# Patient Record
Sex: Male | Born: 1950 | Race: White | Hispanic: No | Marital: Married | Smoking: Former smoker
Health system: Southern US, Community
[De-identification: ages and names within clinical notes are randomized; demographics above are authoritative.]

## PROBLEM LIST (undated history)

## (undated) DIAGNOSIS — I499 Cardiac arrhythmia, unspecified: Secondary | ICD-10-CM

## (undated) DIAGNOSIS — M1990 Unspecified osteoarthritis, unspecified site: Secondary | ICD-10-CM

## (undated) DIAGNOSIS — K219 Gastro-esophageal reflux disease without esophagitis: Secondary | ICD-10-CM

## (undated) DIAGNOSIS — Z8719 Personal history of other diseases of the digestive system: Secondary | ICD-10-CM

## (undated) DIAGNOSIS — T7840XA Allergy, unspecified, initial encounter: Secondary | ICD-10-CM

## (undated) DIAGNOSIS — H269 Unspecified cataract: Secondary | ICD-10-CM

## (undated) DIAGNOSIS — K429 Umbilical hernia without obstruction or gangrene: Secondary | ICD-10-CM

## (undated) DIAGNOSIS — Z87442 Personal history of urinary calculi: Secondary | ICD-10-CM

## (undated) DIAGNOSIS — I639 Cerebral infarction, unspecified: Secondary | ICD-10-CM

## (undated) HISTORY — PX: HERNIA REPAIR: SHX51

## (undated) HISTORY — PX: EYE SURGERY: SHX253

## (undated) HISTORY — PX: EXCISIONAL HEMORRHOIDECTOMY: SHX1541

## (undated) HISTORY — DX: Gastro-esophageal reflux disease without esophagitis: K21.9

## (undated) HISTORY — DX: Unspecified cataract: H26.9

## (undated) HISTORY — DX: Cerebral infarction, unspecified: I63.9

## (undated) HISTORY — DX: Allergy, unspecified, initial encounter: T78.40XA

## (undated) HISTORY — PX: ABDOMINAL HERNIA REPAIR: SHX539

## (undated) HISTORY — PX: SPINE SURGERY: SHX786

## (undated) HISTORY — PX: BACK SURGERY: SHX140

## (undated) HISTORY — PX: CATARACT EXTRACTION W/ INTRAOCULAR LENS  IMPLANT, BILATERAL: SHX1307

---

## 2001-02-26 ENCOUNTER — Encounter: Payer: Self-pay | Admitting: Emergency Medicine

## 2001-02-26 ENCOUNTER — Inpatient Hospital Stay (HOSPITAL_COMMUNITY): Admission: EM | Admit: 2001-02-26 | Discharge: 2001-02-28 | Payer: Self-pay | Admitting: Emergency Medicine

## 2001-03-24 HISTORY — PX: CARDIAC CATHETERIZATION: SHX172

## 2001-03-31 ENCOUNTER — Ambulatory Visit (HOSPITAL_COMMUNITY): Admission: RE | Admit: 2001-03-31 | Discharge: 2001-03-31 | Payer: Self-pay | Admitting: Cardiovascular Disease

## 2001-04-15 ENCOUNTER — Ambulatory Visit (HOSPITAL_COMMUNITY): Admission: RE | Admit: 2001-04-15 | Discharge: 2001-04-15 | Payer: Self-pay | Admitting: Family Medicine

## 2001-04-21 ENCOUNTER — Encounter: Payer: Self-pay | Admitting: Thoracic Surgery

## 2001-04-21 ENCOUNTER — Encounter: Admission: RE | Admit: 2001-04-21 | Discharge: 2001-04-21 | Payer: Self-pay | Admitting: Thoracic Surgery

## 2003-03-22 ENCOUNTER — Encounter: Admission: RE | Admit: 2003-03-22 | Discharge: 2003-03-22 | Payer: Self-pay | Admitting: Family Medicine

## 2005-07-22 ENCOUNTER — Ambulatory Visit (HOSPITAL_COMMUNITY): Admission: RE | Admit: 2005-07-22 | Discharge: 2005-07-22 | Payer: Self-pay | Admitting: Specialist

## 2005-09-02 ENCOUNTER — Encounter: Admission: RE | Admit: 2005-09-02 | Discharge: 2005-09-02 | Payer: Self-pay | Admitting: Family Medicine

## 2005-09-25 ENCOUNTER — Encounter: Admission: RE | Admit: 2005-09-25 | Discharge: 2005-09-25 | Payer: Self-pay | Admitting: Family Medicine

## 2005-10-22 HISTORY — PX: ANTERIOR CERVICAL DECOMP/DISCECTOMY FUSION: SHX1161

## 2005-11-05 ENCOUNTER — Inpatient Hospital Stay (HOSPITAL_COMMUNITY): Admission: AD | Admit: 2005-11-05 | Discharge: 2005-11-07 | Payer: Self-pay | Admitting: Neurosurgery

## 2006-06-22 ENCOUNTER — Ambulatory Visit: Payer: Self-pay | Admitting: Gastroenterology

## 2006-07-06 ENCOUNTER — Encounter (INDEPENDENT_AMBULATORY_CARE_PROVIDER_SITE_OTHER): Payer: Self-pay | Admitting: Specialist

## 2006-07-06 ENCOUNTER — Ambulatory Visit: Payer: Self-pay | Admitting: Gastroenterology

## 2007-08-03 ENCOUNTER — Encounter: Admission: RE | Admit: 2007-08-03 | Discharge: 2007-08-03 | Payer: Self-pay | Admitting: General Surgery

## 2009-12-22 DIAGNOSIS — I639 Cerebral infarction, unspecified: Secondary | ICD-10-CM

## 2009-12-22 HISTORY — DX: Cerebral infarction, unspecified: I63.9

## 2009-12-25 ENCOUNTER — Ambulatory Visit: Payer: Self-pay | Admitting: Cardiovascular Disease

## 2009-12-25 ENCOUNTER — Inpatient Hospital Stay (HOSPITAL_COMMUNITY): Admission: EM | Admit: 2009-12-25 | Discharge: 2009-12-27 | Payer: Self-pay | Admitting: Emergency Medicine

## 2009-12-26 ENCOUNTER — Encounter (INDEPENDENT_AMBULATORY_CARE_PROVIDER_SITE_OTHER): Payer: Self-pay | Admitting: Neurology

## 2009-12-27 ENCOUNTER — Encounter: Payer: Self-pay | Admitting: Internal Medicine

## 2010-04-14 ENCOUNTER — Encounter: Payer: Self-pay | Admitting: Family Medicine

## 2010-06-05 LAB — HEMOGLOBIN A1C
Hgb A1c MFr Bld: 5.6 % (ref <5.7)
Mean Plasma Glucose: 114 mg/dL (ref <117)

## 2010-06-05 LAB — CK TOTAL AND CKMB (NOT AT ARMC): Relative Index: INVALID (ref 0.0–2.5)

## 2010-06-05 LAB — PROTEIN C ACTIVITY: Protein C Activity: 117 % (ref 75–133)

## 2010-06-05 LAB — ANTITHROMBIN III: AntiThromb III Func: 83 % (ref 76–126)

## 2010-06-05 LAB — COMPREHENSIVE METABOLIC PANEL
AST: 20 U/L (ref 0–37)
BUN: 9 mg/dL (ref 6–23)
CO2: 29 mEq/L (ref 19–32)
Calcium: 8.8 mg/dL (ref 8.4–10.5)
Creatinine, Ser: 0.97 mg/dL (ref 0.4–1.5)
GFR calc Af Amer: >60 mL/min (ref >60)
GFR calc non Af Amer: >60 mL/min (ref >60)
Glucose, Bld: 113 mg/dL — ABNORMAL HIGH (ref 70–99)

## 2010-06-05 LAB — BETA-2-GLYCOPROTEIN I ABS, IGG/M/A: Beta-2 Glyco I IgG: 0 G Units (ref <20)

## 2010-06-05 LAB — POCT I-STAT, CHEM 8
BUN: 10 mg/dL (ref 6–23)
Calcium, Ion: 1.15 mmol/L (ref 1.12–1.32)
Chloride: 102 mEq/L (ref 96–112)

## 2010-06-05 LAB — CBC
HCT: 42.1 % (ref 39.0–52.0)
Hemoglobin: 14.8 g/dL (ref 13.0–17.0)
MCH: 31.9 pg (ref 26.0–34.0)
MCHC: 35.2 g/dL (ref 30.0–36.0)

## 2010-06-05 LAB — TROPONIN I: Troponin I: 0.01 ng/mL (ref 0.00–0.06)

## 2010-06-05 LAB — LIPID PANEL: Cholesterol: 130 mg/dL (ref 0–200)

## 2010-06-05 LAB — POCT CARDIAC MARKERS
CKMB, poc: <1 ng/mL — ABNORMAL LOW (ref 1.0–8.0)
Troponin i, poc: <0.05 ng/mL (ref 0.00–0.09)

## 2010-06-05 LAB — CARDIOLIPIN ANTIBODIES, IGG, IGM, IGA
Anticardiolipin IgA: 5 APL U/mL — ABNORMAL LOW (ref <22)
Anticardiolipin IgG: 11 GPL U/mL — ABNORMAL LOW (ref <23)
Anticardiolipin IgM: 2 MPL U/mL — ABNORMAL LOW (ref <11)

## 2010-06-05 LAB — DIFFERENTIAL
Basophils Relative: 0 % (ref 0–1)
Eosinophils Absolute: 0 10*3/uL (ref 0.0–0.7)
Monocytes Absolute: 0.5 10*3/uL (ref 0.1–1.0)
Monocytes Relative: 6 % (ref 3–12)
Neutro Abs: 6.2 10*3/uL (ref 1.7–7.7)

## 2010-06-05 LAB — RPR: RPR Ser Ql: NONREACTIVE

## 2010-06-05 LAB — FACTOR 5 LEIDEN

## 2010-06-05 LAB — LUPUS ANTICOAGULANT PANEL

## 2010-06-05 LAB — PROTEIN S ACTIVITY: Protein S Activity: 93 % (ref 69–129)

## 2010-06-05 LAB — HOMOCYSTEINE: Homocysteine: 8.5 umol/L (ref 4.0–15.4)

## 2010-06-05 LAB — APTT: aPTT: 26 seconds (ref 24–37)

## 2010-08-09 NOTE — Discharge Summary (Signed)
[place]. [facility]  Patient:    Stephen King, Stephen King Visit Number: [id number] MRN: [id number]          Service Type: MED Location: [phone] Attending Physician:  Roque Lias Dictated by:   Reuben Likes, M.D. Admit Date:  02/26/2001 Discharge Date: 02/28/2001   CC:         Carolyne Fiscal, M.D.   Discharge Summary  SUMMARY OF HISTORY AND PHYSICAL:  Stephen King is a 60 year old male who has had a one week history of intermittent substernal chest pain without radiation, brought on sometimes by exertion such as stair climbing, but also would come on at rest.  The pain has been associated with shortness of breath and presyncope, but not nausea or diaphoresis.  Episodes would last from 1-5 minutes at a time.  He has no cardiac pain.  The pain was relieved by nitroglycerin in the emergency room.  PHYSICAL EXAMINATION:  VITAL SIGNS:  Physical examination reveals a blood pressure of 125/77, a pulse of 78 and regular, respirations are 16 and temperature of 100.3 degrees.  GENERAL APPEARANCE:  He appears alert and in no distress.  SKIN:  Warm and dry.  HEENT:  PERRL.  Fundi are benign.  ENT are unremarkable.  NECK:  There is no cervical adenopathy, JVD or bruits.  Thyroid is normal.  LUNGS:  Clear.  HEART:  His cardiac rhythm is regular without gallop, murmur or rub.  ABDOMEN:  Exam is unremarkable.  EXTREMITIES:  He has no pedal edema.  Pulses are full.  NEUROLOGIC:  Exam is within normal limits.  ADMITTING IMPRESSION:  Chest pain, rule out angina.  SUMMARY OF LABORATORIES:  His admitting EKG was normal and follow up tracing the next morning showed no change.  CT of the chest was negative for pulmonary embolus.  There were changes associated with COPD and he had an 8 x 10 mm right middle lobe nodule.  Hemoglobin was 15.4 and white count 7,800.  His C-MET was within normal limits.  CPK was 87 with an MB of 1.2 and troponin was less  than 0.01.  Follow up CPKs were also within the normal range.  MBs were negative times three and troponins were negative times three.  HOSPITAL COURSE:  The patient was hospitalized on a telemetry bed and seen by Dr. Charlton Haws.  Vital signs were obtained every four hours.  He was allowed to get up for bathroom privileges.  He was given a 2-gram, fat modified diet. D5 half normal saline was started at 50 cc/hour.  He was begun on heparin by pharmacy protocol, Lopressor 50 mg b.i.d., nitroglycerin drip was continued and he was given aspirin and Tylenol for pain.  By the following morning he still complained of an ache in the substernal area, which was mild and constant since admission.  His monitor showed normal sinus rhythm.  His heparin and nitroglycerin were discontinued.  He was allowed to be up ad lib.  The following morning he had no further chest pain, was ambulating in the hallway.  He had no shortness of breath.  He felt a little bit light-headed. His blood pressure was 100/70 and his pulse was 70.  His lungs were clear. His cardiac rhythm was regular without murmur or rub.  Monitor showed normal sinus rhythm.  It was felt he could be discharged on that date to follow up as an outpatient and a stress Cardiolite was to be done by Dr. Myrtis Ser.  The pulmonary nodule is also to be followed up as an outpatient and he will be referred to a chest surgeon for further evaluation and follow up of that.  DISCHARGE DIAGNOSES: 1. Chest pain of uncertain cause. 2. Lung nodule.  DISCHARGE MEDICATIONS:  Ecotrin 325 mg daily, metoprolol 50 mg b.i.d., nitroglycerin as needed for chest pain and Protonix 20 mg twice daily.  ACTIVITY:  No work for the next three to seven days.  DIET:  Low-fat, low-cholesterol.  FOLLOW-UP:  He is to see me the following Friday and also to see Dr. Myrtis Ser for the stress Cardiolite scan. Dictated by:   Reuben Likes, M.D. Attending Physician:  Roque Lias DD:  03/20/01 TD:  03/21/01 Job: [id number]

## 2010-08-09 NOTE — Cardiovascular Report (Signed)
. [facility]  Patient:    Stephen King, Stephen King Visit Number: [id number] MRN: [id number]          Service Type: CAT Location: [facility] [id number] Attending Physician:  Colon Branch Dictated by:   Arturo Morton Riley Kill, M.D. [employer] Proc. Date: 03/31/01 Admit Date:  03/31/2001   CC:         Reuben Likes, M.D.  Luis Abed, M.D. [facility]  Theron Arista C. Eden Emms, M.D. [facility]  CV Laboratory   Cardiac Catheterization  INDICATIONS:  The patient was recently admitted with some chest pain.  He subsequently had a CT scan which ruled out pulmonary emboli, but he had a pulmonary nodule, and he has been followed by Dr. Edwyna Shell.  He was also seen by Dr. Myrtis Ser, and he was noted to have a mildly abnormal Cardiolite.  He was subsequently referred for coronary arteriography.  The Cardiolite scan revealed no electrocardiographic changes with exercise and a very mild apical ischemia. This was a very small territory.  PROCEDURE: 1. Left heart catheterization. 2. Selective coronary arteriography. 3. Selective left ventriculography. 4. Proximal root aortography.  CARDIOLOGIST:  Arturo Morton. Riley Kill, M.D. [facility]  DESCRIPTION OF THE PROCEDURE:  The procedure was performed from the right femoral artery using 6-French catheter.  He tolerated the procedure well without complication.  He was taken to the holding area in satisfactory clinical condition.  HEMODYNAMIC DATA: 1. The central aortic pressure was 103/10. 2. LV pressure 105/70. 3. No gradient on pullback across the aortic valve.  ANGIOGRAPHIC DATA: 1. The left main coronary artery was extremely short.  It bifurcated into    an LAD and circumflex. 2. The left anterior descending artery coursed to the apex.  The LAD tapered    in the mid and distal portion and was a very small caliber vessel.  It did    wrap at the apex.  There was minor luminal irregularity but no high-grade    areas of disease. 3. The circumflex was a fairly  large vessel providing a modest size proximal    marginal branch, a very large second marginal branch, and three    posterolateral branches.  The circumflex had minor luminal irregularity but    no high-grade areas of disease. 4. The right coronary artery was a dominant vessel providing posterior    descending and posterolateral system.  It was free of significant disease. 5. The left ventricular systolic function was normal.  No segmental    abnormalities or contraction were identified. 6. The aortic root demonstrates no evidence of aortic regurgitation or    dissection.  CONCLUSIONS: 1. Normal left ventricular function. 2. No areas of high-grade coronary focal obstruction. 3. No evidence of aortic dissection.  DISPOSITION:  Follow up with Dr. Lorenz Coaster and Dr. Myrtis Ser. Dictated by:   Arturo Morton Riley Kill, M.D. [facility] Attending Physician:  Colon Branch DD:  03/31/01 TD:  03/31/01 Job: [id number]

## 2010-08-09 NOTE — Consult Note (Signed)
[place]. [facility]  Patient:    Stephen King, Stephen King Visit Number: [id number] MRN: [id number]          Service Type: MED Location: [phone] Attending Physician:  Roque Lias Dictated by:   Noralyn Pick Eden Emms, M.D., [facility], [facility] Proc. Date: 02/26/01 Admit Date:  02/26/2001   CC:         Dr. Lorenz Coaster   Consultation Report  REASON FOR CONSULTATION:  Chest pain.  HISTORY OF PRESENT ILLNESS:  Stephen King is a 60 year old patient of Dr. Lorenz Coaster.  He has intermittent substernal chest pain over the last week to 10 days.  The pain is somewhat constant.  It is not necessarily related to exertion.  There is no pleuritic component.  The patient has not had pain like this before.  He has had some heartburn in the past, but this is decidedly different.  The patient has occasional lightheadedness and dyspnea with this. There is no previous history of PE or lower extremity clot.  The patient has not had early relief of the pain in the ER with nitroglycerin.  PAST MEDICAL HISTORY: Remarkable for surgery in 1992 for a disk.  He has not had followup with Dr. Lorenz Coaster in a year or two.  ALLERGIES:  FELDENE.  MEDICATIONS:  The patient takes no medicines on a routine basis.  PHYSICAL EXAMINATION:  VITAL SIGNS:  Blood pressure 110/70 in each arm.  Heart rate was 70 and regular.  LUNGS:  Clear.  NECK:  Carotid were normal.  There were no supraclavicular bruits.  CARDIAC:  There was an S1, S2 without murmur, rub, gallop, or click.  ABDOMEN:  Benign.  There is no AAA.  EXTREMITIES:  Lower extremities have intact pulses with no edema.  LABORATORY DATA:  EKG shows some flat ST segments laterally but was read as normal by the computer.  Initial set of lab work was normal.  IMPRESSION:  The patients pain is somewhat atypical for angina.  He has had significant pain with a tearing quality which is nonexertional, and his chest x-ray shows minimal aortic root  dilatation, but I think it is reasonable to proceed with a CT scan to rule out pulmonary embolism or aortic dissection. If this is normal, I would admit him for 24-hour observation.  Repeat his enzymes and EKG, and if these are unremarkable and he has relief of his symptoms with nonsteroidal anti-inflammatories, then I think it is reasonable to do an outpatient Cardiolite study on the patient. Dictated by:   Noralyn Pick Eden Emms, M.D., [facility], [facility] Attending Physician:  Roque Lias DD:  02/26/01 TD:  02/27/01 Job: [id number]

## 2010-08-09 NOTE — Op Note (Signed)
NAME:  Stephen King, Stephen King NO.:  [id number]   MEDICAL RECORD NO.:  [id number]          PATIENT TYPE:  OIB   LOCATION:  3037                         FACILITY:  [facility]   PHYSICIAN:  Hilda Lias, M.D.   DATE OF BIRTH:  Nov 26, 1950   DATE OF PROCEDURE:  11/04/2005  DATE OF DISCHARGE:                                 OPERATIVE REPORT   SURGEON:  Hilda Lias, M.D.   ASSISTANT:  Coletta Memos, M.D.   PREOPERATIVE DIAGNOSIS:  C5-C6, C6-C7 spondylosis with accompanying  radiculopathy.   POSTOPERATIVE DIAGNOSIS:  C5-C6, C6-C7 spondylosis with accompanying  radiculopathy.   PROCEDURE:  C5-6, 6-7 decompression of the spinal cord, interbody fusion,  allograft, plate, microscopy.   CLINICAL HISTORY:  The patient was admitted because of neck pain radiating  to both upper extremities.  X-rays showed several stenosis at the level of 5-  6, 6-7.  The patient has chronic weakness of the biceps and the triceps.  Surgery was advised and the risks were explained in the History and  Physical.   DESCRIPTION OF PROCEDURE:  The patient was taken to the OR.  After  intubation, the neck was prepped with DuraPrep.  A transverse incision  through skin, subcutaneous tissue, platysma was carried out all the way down  to the cervical area.  X-rays showed that we were at the level of 4-5.  From  then on, we identified 5-6 and 6-7.  We brought the microscope into the area  and the PLL ligament was opened on both places.  Total gross diskectomy was  done.  At the level of 5-6, we found quite a bit of spondylosis with chronic  herniated disk, which was partially calcified, mostly bilaterally, but  mostly worst to the right side.  Decompression was achieved at that level.  At the level of C6-C7, we found the same finding with the same chronic  spondylosis and herniated disk.  At the end, we had a good central  decompression, as well as decompression of the C6 and C7 nerve roots.  From  then on,  the endplates were drilled, and 2 pieces of allograft, 7 and 9 mm,  were inserted without autograft inside.  Then, a plate using 6 screws was  applied to the cervical spine.  Lateral C spine showed good position of the  grafts and the plate.  From then on, hemostasis was achieved.  We waited 10  minutes just to be sure there was no bleeding.  Once we realized there was  no bleeding, the area was closed with Vicryl and Steri-Strips.     Then, with the Stille rongeur, we removed the spinous processes of L4 and  L5, and the laminae bilaterally.  With the microscope, we found that the  patient had quite a bit of thickening of the yellow ligament, compromising  the L4-5 space.  Removal with the 2- and 3-mm Kerrison punches was  accomplished.  At the end, we went laterally and we decompressed the  foramina for the nerve roots of L4, L5 and S1.  Having a good decompression,  we investigated the  area of the disk.  The disk at L4-5 was bulging, but there was no evidence  of any ruptured disk.  The one at L5-1 was flat. From there on, the area was  irrigated.  Hemostasis was done with electrocautery.  Fentanyl and Depo-  Medrol were left in the epidural space, and the wound was closed with Vicryl  and Steri-Strips.           ______________________________  Hilda Lias, M.D.     EB/MEDQ  D:  11/04/2005  T:  11/04/2005  Job:  [id number]

## 2010-08-09 NOTE — H&P (Signed)
[place]. [facility]  Patient:    Stephen King, Stephen King Visit Number: [id number] MRN: [id number]          Service Type: MED Location: [phone] Attending Physician:  Roque Lias Dictated by:   Reuben Likes, M.D. Admit Date:  02/26/2001   CC:         Carolyne Fiscal, M.D.   History and Physical  CHIEF COMPLAINT: "Chest pain."  HISTORY OF PRESENT ILLNESS: Stephen King is a 60 year old male, who has had a one week history of intermittent substernal chest pain without radiation, brought on sometimes by exertion such as stair climbing but also comes on at rest.  The pain has been associated with shortness of breath and presyncope, but not nausea or diaphoresis.  The episodes last from one to five minutes at a time.  He has no cardiac history.  The pain was relieved by nitroglycerin in the emergency room.  PAST SURGICAL HISTORY: Hemorrhoidectomy in 1992.  PAST HOSPITALIZATIONS: None.  PAST MEDICAL HISTORY: The patient has a history of kidney stones.  MEDICATIONS: None.  ALLERGIES: FELDENE causes him to break out in a rash.  FAMILY HISTORY: His fathers health status is unknown.  His mother has hypertension.  He has three brothers, all in good health, and two sisters in good health, and two children in good health.  SOCIAL HISTORY: The patient is married.  He works as an Teacher, adult education.  He did smoke 1-1/2 pack of cigarettes a day up until about three years ago when he quit.  He does not use alcohol at all, and drinks from one to five cups of coffee per day.  REVIEW OF SYSTEMS: Denies other systemic skin, eye, ENT, respiratory, cardiovascular, GI, GU, musculoskeletal, or neurologic complaints.  PHYSICAL EXAMINATION:  VITAL SIGNS: Blood pressure 125/77, pulse 78 and regular, respirations 16, temperature 100.3 degrees.  GENERAL: He is alert and appears in no distress.  SKIN: Warm and dry.  No rash.  HEENT: PERRL.  EOMI.  Fundi benign.   Sclerae nonicteric.  ENT, TMs normal.  No intraoral lesions.  Mucous membranes moist.  Pharynx clear.  NECK: Supple.  No adenopathy, JVD, or bruits.  Thyroid normal.  LUNGS: Clear to auscultation and percussion.  HEART: Regular rhythm.  No gallop, murmur, or rub.  ABDOMEN: Soft, nontender, without organomegaly or masses.  Bowel sounds normoactive.  EXTREMITIES: No edema.  Pedal pulses full.  NEUROLOGIC: Alert and oriented.  Speech clear.  No focal muscular weakness or tremor.  DTRs 2+ and symmetrical.  Babinski downgoing.  Cranial nerves intact.  LABORATORY DATA: EKG shows normal sinus rhythm with no ischemic changes. Chest x-ray shows bibasilar atelectasis.  CPK and troponin negative x 1.  IMPRESSION: Chest pain, rule out angina.  PLAN:  1. Admit to monitor bed.  2. Serial CPK-MB and enzymes.  3. Heparin and nitroglycerin drip.  4. Lopressor and aspirin.  5. Cardiology consultation to be obtained. Dictated by:   Reuben Likes, M.D. Attending Physician:  Roque Lias DD:  02/26/01 TD:  02/27/01 Job: [id number]

## 2010-08-09 NOTE — Discharge Summary (Signed)
NAME:  Stephen King, Stephen King NO.:  [id number]   MEDICAL RECORD NO.:  [id number]          PATIENT TYPE:  INP   LOCATION:  3037                         FACILITY:  [facility]   PHYSICIAN:  Hilda Lias, M.D.   DATE OF BIRTH:  Aug 03, 1950   DATE OF ADMISSION:  11/04/2005  DATE OF DISCHARGE:  11/07/2005                                 DISCHARGE SUMMARY   ADMIT DATE:  November 04, 2005   DISCHARGE DATE:  November 07, 2005   ADMISSION DIAGNOSIS:  C5-6 to C6-7 spondylosis, herniated disc.   INDICATIONS:  The patient was admitted because of chronic back pain with  radiation in both upper extremities.  X-rays show stenosis and spondylosis  at C5-6 and C6-7 .   LABORATORY:  Normal.   HOSPITAL COURSE:  The patient was taken to surgery and fusion of the  compression at C5-6 and C6-7 was done.  Patient did well, but he had some  pain swallowing about 24 hours after surgery.  He was given __________  IV.  Today, he is doing great and he is ready to go home.   CONDITION ON DISCHARGE:  Improved.   MEDICATIONS:  Percocet, cortisone for 6 days and diazepam.   Regular activity.  No lifting more than 20 pounds to the shoulder.   FOLLOWUP:  To be seen by me in 4 weeks.           ______________________________  Hilda Lias, M.D.     EB/MEDQ  D:  11/07/2005  T:  11/07/2005  Job:  [id number]

## 2010-12-24 ENCOUNTER — Ambulatory Visit
Admission: RE | Admit: 2010-12-24 | Discharge: 2010-12-24 | Disposition: A | Discharge status: Home / Self Care | Payer: 59 | Source: Ambulatory Visit | Attending: Emergency Medicine | Admitting: Emergency Medicine

## 2010-12-24 ENCOUNTER — Inpatient Hospital Stay (INDEPENDENT_AMBULATORY_CARE_PROVIDER_SITE_OTHER)
Admission: RE | Admit: 2010-12-24 | Discharge: 2010-12-24 | Disposition: A | Discharge status: Home / Self Care | Payer: 59 | Source: Ambulatory Visit | Attending: Emergency Medicine | Admitting: Emergency Medicine

## 2010-12-24 ENCOUNTER — Other Ambulatory Visit: Payer: Self-pay | Admitting: Emergency Medicine

## 2010-12-24 ENCOUNTER — Encounter: Payer: Self-pay | Admitting: Emergency Medicine

## 2010-12-24 DIAGNOSIS — R209 Unspecified disturbances of skin sensation: Secondary | ICD-10-CM

## 2010-12-24 DIAGNOSIS — S0990XA Unspecified injury of head, initial encounter: Secondary | ICD-10-CM

## 2011-01-01 ENCOUNTER — Telehealth (INDEPENDENT_AMBULATORY_CARE_PROVIDER_SITE_OTHER): Payer: Self-pay | Admitting: *Deleted

## 2011-02-24 NOTE — Telephone Encounter (Signed)
  Phone Note Outgoing Call Call back at [facility] Phone [phone]   Call placed by: Lajean Saver RN,  January 01, 2011 4:49 PM Call placed to: Patient Action Taken: Phone Call Completed Summary of Call: Callback: Spoke with patients wife who reports he has seen his neurologist and will be scheduled for an MRI. He has gone back to work and is improved.

## 2011-02-24 NOTE — Progress Notes (Signed)
Summary: Headache/Light Headed rm 4   Vital Signs:  Patient Profile:   60 Years Old Male CC:      Headache x 3 days Height:     59.5 inches Weight:      221.25 pounds O2 Sat:      98 % O2 treatment:    Room Air Temp:     98.6 degrees F oral Pulse rate:   74 / minute Resp:     18 per minute BP sitting:   111 / 71  (left arm) Cuff size:   large  Vitals Entered By: Clemens Catholic LPN (December 24, 2010 9:01 AM)                  Updated Prior Medication List: ASPIRIN 325 MG TABS (ASPIRIN)  Iris study: proghitazone or placebo Current Allergies: ! FELDENEHistory of Present Illness History from: patient and adult son Chief Complaint: Headache x 7 days History of Present Illness: Chief complaint is headaches for seven days.  Onset seven days ago, when he accidentally fell backward striking the back of his head on some concrete. No loss of consciousness. Sustained an abrasion, had some minimal bleeding of the skin which stopped with direct pressure. Denies any loss of memory from the event. Ever since then, he's had a moderate posterior occipital headache, lightheadedness occasionally feeling disoriented. He has chronic left upper and lower extremity tingling for many years that he feels is from prior disc problem, (status post C6-7 fusion 2007) which predated his small left hemispheric lacunar infarct which occurred almost exactly  1 year ago in October 2011. He is followed by Dr. Pearlean Brownie, his neurologist since October 2011 q six months. Currently in the IRIS study, has an appointment to see Dr. Pearlean Brownie next month for follow-up. He has no PCP and I urged him to establish with one.  In general he feels "hung over" without any other specific symptoms.denies chest pain, dyspnea, nausea, vomiting, fever, chills, GU symptoms, visual changes, loss of consciousness.  REVIEW OF SYSTEMS Constitutional Symptoms       Complains of fatigue.     Denies fever, chills, night sweats, weight loss, and  weight gain.  Eyes       Complains of eye surgery.      Denies change in vision, eye pain, eye discharge, glasses, and contact lenses. Ear/Nose/Throat/Mouth       Denies hearing loss/aids, change in hearing, ear pain, ear discharge, dizziness, frequent runny nose, frequent nose bleeds, sinus problems, sore throat, hoarseness, and tooth pain or bleeding.  Respiratory       Complains of shortness of breath.      Denies dry cough, productive cough, wheezing, asthma, bronchitis, and emphysema/COPD.  Cardiovascular       Complains of tires easily with exhertion.      Denies murmurs and chest pain.    Gastrointestinal       Denies stomach pain, nausea/vomiting, diarrhea, constipation, blood in bowel movements, and indigestion.      Comments: nausea Genitourniary       Denies painful urination, kidney stones, and loss of urinary control. Neurological       Complains of headaches, numbness, and tingling.      Denies paralysis, seizures, and fainting/blackouts. Musculoskeletal       Complains of muscle pain and joint pain.      Denies joint stiffness, decreased range of motion, redness, swelling, muscle weakness, and gout.  Skin       Denies  bruising, unusual mles/lumps or sores, and hair/skin or nail changes.  Psych       Complains of anxiety/stress.      Denies mood changes, temper/anger issues, speech problems, depression, and sleep problems. Other Comments: pt states that he fell last Tuesday @ work and hit his head on concrete, he refused medical treatment at the time. On saturday he c/o having trouble starting to walk, HA, light headed, numbness and tingling. yesterday he c/o HA, light headed, SOB, and felt dis oriented. today he c/o HA again and feeling light headed. he has taken Aleve.   Past History:  Past Medical History: CVA 12/2009'  Past Surgical History: C6 and 7 8/07' umbilical hernia 5/09'  Family History: mom-knee replacement dad- colon CA  Social History: Never  Smoked Alcohol use-no Drug use-no Smoking Status:  never Drug Use:  no Physical Exam General appearance: well developed, well nourished, no acute distress Head: normocephalic, healing abrasion posterior occipital area. No other head injury noted. Eyes: conjunctivae and lids normal. Fundi within normal limits.  Pupils: equal, round, reactive to light Ears: normal, no lesions or deformities Nasal: mucosa pink, nonedematous, no septal deviation, turbinates normal Oral/Pharynx: tongue normal, protrudes in the midline, posterior pharynx without erythema or exudate Neck: neck supple,  trachea midline, no masses. carotid's 2+ an equal without bruits Thyroid: no nodules, masses, tenderness, or enlargement Chest/Lungs: no rales, wheezes, or rhonchi bilateral, breath sounds equal without effort Heart: regular rate and  rhythm, no murmur Abdomen: soft, non-tender without obvious organomegaly GU: deferred Extremities: normal extremities. nontender, no cords or calf edema Neurological: grossly intact and non-focal, except decrease sensation left upper and lower extremities.  Gait normal.speech clear, within normal limits. Motor and DTRs within normal limits. sensory shows decreased sensation in the left upper and lower extremities, he states this is been chronic for years. Cranial nerves normal Back: no tenderness over musculature, straight leg raises negative bilaterally, deep tendon reflexes 2+ at achilles and patella Skin: no obvious rashes or lesions MSE: oriented to time, place, and person Assessment New Problems: PARESTHESIA (ICD-782.0) HEAD TRAUMA, CLOSED (ICD-959.01)  I consulted with the radiologist on call from [facility]. in his opinion,head CT without contrast was indicated as a stat test to rule out any intracranial bleed as cause for his headache from his closed head injury seven days ago. CT without contrast, report : "Normal CT scan of the head with no evidence of acute  intracranial abnormality."  DX: closed head injury, without evidence of intracranial bleed or subdural hematoma. Most likely has a post concussive headache. By history and exam, no evidence of acute CVA, although it's possible he could have had a TIA. He is already under treatment by Dr. Pearlean Brownie for prophylaxis for cerebrovascular disease..    Plan New Orders: T-CT Head/Brain w/o contrast [70450] T-CT Head w/o cm [70450] New Patient Level IV [99204] Planning Comments:   Over a 2 hour period, I diligently and repeatedly tried to contact Dr. Pearlean Brownie at his neurology office and through [facility] hospital paging system, but I was unable to contact Dr. Pearlean Brownie. I kept trying to contact physician covering for Dr. Pearlean Brownie and I was finally able to reach Dr. Thad Ranger, who was covering for Dr. Pearlean Brownie at [phone]. I discussed the patient's case at length with Dr. Thad Ranger. Dr. Thad Ranger agreed completely with the work up, assessment at this point and agreed that diagnosis is most likely post concussive headache. Dr. Thad Ranger felt that, given the head CT was negative, that patient  did not require ER evaluation and/or hospitalization today. Instead, Dr. Thad Ranger advised treating patient with judicious pain medication today. And Dr. Thad Ranger promised that she would facilitate office visit follow-up appointment with Dr. Pearlean Brownie tomorrow in Dr. Marlis Edelson office. I gave Dr. Thad Ranger the patient's name and contact information and she promised to arrange appointment tomorrow with Dr. Pearlean Brownie and have Dr. Marlis Edelson office contact patient with specifics regarding that appointment. Continue taking : ASPIRIN 325 MG daily and per Iris study: proghitazone or placebo. I gave written prescription for Vicodin 5/500. #5. No refills. . 1 or 2 by mouth Q 46 hours as needed severe headache. Instructed to go to ER stat if any new neurologic symptoms or any acute worsening symptoms. I then explained all the above to patient and son who voiced  understanding and agreement with all  the above plans.   The patient and/or caregiver has been counseled thoroughly with regard to medications prescribed including dosage, schedule, interactions, rationale for use, and possible side effects and they verbalize understanding.  Diagnoses and expected course of recovery discussed and will return if not improved as expected or if the condition worsens. Patient and/or caregiver verbalized understanding.   Orders Added: 1)  T-CT Head/Brain w/o contrast [70450] 2)  T-CT Head w/o cm [70450] 3)  New Patient Level IV [40981]  Appended Document: Headache/Light Headed rm 4 Pt denied any posterior neck pain.  On physical exam, there was no c-spine tenderness or deformity.

## 2011-05-06 ENCOUNTER — Encounter: Payer: Self-pay | Admitting: Physician Assistant

## 2011-05-06 ENCOUNTER — Ambulatory Visit (INDEPENDENT_AMBULATORY_CARE_PROVIDER_SITE_OTHER): Payer: Managed Care, Other (non HMO) | Admitting: Physician Assistant

## 2011-05-06 VITALS — BP 113/70 | HR 82 | Ht 70.0 in | Wt 226.0 lb

## 2011-05-06 DIAGNOSIS — I639 Cerebral infarction, unspecified: Secondary | ICD-10-CM

## 2011-05-06 DIAGNOSIS — I69998 Other sequelae following unspecified cerebrovascular disease: Secondary | ICD-10-CM

## 2011-05-06 DIAGNOSIS — Z8673 Personal history of transient ischemic attack (TIA), and cerebral infarction without residual deficits: Secondary | ICD-10-CM

## 2011-05-06 NOTE — Patient Instructions (Signed)
Needs to schedule CPE in near future. Will get lab work from research study and bring to office so we don't repeat blood work.

## 2011-05-06 NOTE — Progress Notes (Signed)
  Subjective:    Patient ID: Stephen King, male    DOB: [birth year], 61 y.o.   MRN: [id number]  HPI Patient presents today to the clinic to establish care. Patient wanted to stay in the [facility] so it would be easier for everyone to see records. Patient has no problems or concerns today.   He has a history of stroke in October 2011. He has some post-stroke symptoms of numbness of left hand and memory loss. He only take Asprin daily for stroke prevention. He does not remember the last time his cholesterol was checked. He has never had blood pressure problems.   He is in a research study for pre-diabetes and could or could not be taking actos. He gets bloodwork checked regularly through the study.   He recently had a concussion in the last 2 months and had a scan of his head done. He was told he had "white spots" on the scan. He wants to get a copy so we can tell him what this might be from and if he should be concerned.    Review of Systems     Objective:   Physical Exam  Constitutional: He is oriented to person, place, and time. He appears well-developed and well-nourished.  Cardiovascular: Normal rate, regular rhythm, normal heart sounds and intact distal pulses.   Pulmonary/Chest: Effort normal and breath sounds normal.  Neurological: He is alert and oriented to person, place, and time.  Skin: Skin is warm and dry.  Psychiatric: He has a normal mood and affect. His behavior is normal.          Assessment & Plan:  History of Stroke- We need to get a lipid panel and check glucose. Patient wants to see if he can get results from the research company because he gets blood work there frequently and then we can do whatever else we need.   Patient will schedule a CPE in the near future.

## 2011-06-04 ENCOUNTER — Encounter: Payer: Self-pay | Admitting: *Deleted

## 2011-06-04 LAB — PSA: PSA: 0.7 ng/mL

## 2011-07-04 ENCOUNTER — Ambulatory Visit (INDEPENDENT_AMBULATORY_CARE_PROVIDER_SITE_OTHER): Payer: Managed Care, Other (non HMO) | Admitting: Physician Assistant

## 2011-07-04 ENCOUNTER — Encounter: Payer: Self-pay | Admitting: Physician Assistant

## 2011-07-04 VITALS — BP 111/71 | HR 76 | Ht 70.0 in | Wt 224.0 lb

## 2011-07-04 DIAGNOSIS — R209 Unspecified disturbances of skin sensation: Secondary | ICD-10-CM

## 2011-07-04 DIAGNOSIS — R3915 Urgency of urination: Secondary | ICD-10-CM

## 2011-07-04 DIAGNOSIS — R200 Anesthesia of skin: Secondary | ICD-10-CM

## 2011-07-04 DIAGNOSIS — N529 Male erectile dysfunction, unspecified: Secondary | ICD-10-CM

## 2011-07-04 DIAGNOSIS — Z79899 Other long term (current) drug therapy: Secondary | ICD-10-CM

## 2011-07-04 MED ORDER — TADALAFIL 10 MG PO TABS: 5.0000 mg | ORAL_TABLET | Freq: Every day | ORAL | Status: DC | PRN

## 2011-07-04 NOTE — Progress Notes (Signed)
  Subjective:    Patient ID: Stephen King, male    DOB: Aug 21, 1950, 61 y.o.   MRN: [id number]  HPI Patient presents to clinic to follow up with blood work. He is in a study for cholesterol and has result that we can scan in. He has had a stroke a couple of years ago. His cholesterol very good without mediation. He remains on a ASA daily. Today he has had some numbness and tingling of bilateral feet and some in hands. He has not had any blood work in a long time. There is no time that it is better or worse.   He has a history of ED but since stroke has not been on any medication. His relationship is great with his wife he just needs help with the function.He does have some urinary frequency but no urgency. He does not think he needs medication and denies this interfering with quality of life.   Review of Systems     Objective:   Physical Exam  Constitutional: He is oriented to person, place, and time. He appears well-developed and well-nourished.  HENT:  Head: Normocephalic and atraumatic.  Cardiovascular: Normal rate, regular rhythm, normal heart sounds and intact distal pulses.   Pulmonary/Chest: Effort normal and breath sounds normal. He has no wheezes.  Genitourinary:       Declined DRE.  Neurological: He is alert and oriented to person, place, and time.       Pedal pulses intact. No leg edema. No skin or color changes of bilateral legs or feet.  Skin: Skin is warm and dry.  Psychiatric: He has a normal mood and affect. His behavior is normal.          Assessment & Plan:  Numbness and Burning in extremities-b12, hga1c, cmp, vit D will call with results. Lipid panel will be scaned into system. Testing most likely causes.  Urinary Urgency- AUA was 7. Mild. Patient declined DRE. Ordered PSA. Patient denies that symptoms interfer with his life.   Erectile Dysfunction- Start using Cialis 10mg  1/2 tab as needed for sex if well tolerated and need more then can increase to full tab.

## 2011-07-04 NOTE — Patient Instructions (Signed)
Get blood drawn will call with results. Start using Cialis 10mg  1/2 tab as needed for sex.

## 2011-07-05 LAB — COMPLETE METABOLIC PANEL WITH GFR
ALT: 14 U/L (ref 0–53)
Albumin: 4.1 g/dL (ref 3.5–5.2)
CO2: 24 mEq/L (ref 19–32)
Calcium: 8.9 mg/dL (ref 8.4–10.5)
Chloride: 106 mEq/L (ref 96–112)
Creat: 0.95 mg/dL (ref 0.50–1.35)
GFR, Est African American: >89 mL/min
Potassium: 4.5 mEq/L (ref 3.5–5.3)
Sodium: 142 mEq/L (ref 135–145)
Total Protein: 6.4 g/dL (ref 6.0–8.3)

## 2011-07-05 LAB — CBC WITH DIFFERENTIAL/PLATELET
Basophils Absolute: 0 10*3/uL (ref 0.0–0.1)
Eosinophils Relative: 1 % (ref 0–5)
HCT: 42.3 % (ref 39.0–52.0)
Hemoglobin: 13.8 g/dL (ref 13.0–17.0)
Lymphocytes Relative: 29 % (ref 12–46)
MCHC: 32.6 g/dL (ref 30.0–36.0)
MCV: 95.9 fL (ref 78.0–100.0)
Monocytes Absolute: 0.4 10*3/uL (ref 0.1–1.0)
Monocytes Relative: 7 % (ref 3–12)
RDW: 14.3 % (ref 11.5–15.5)
WBC: 6 10*3/uL (ref 4.0–10.5)

## 2011-07-05 LAB — PSA, TOTAL AND FREE: PSA, Free Pct: 48 % (ref >25)

## 2011-08-26 ENCOUNTER — Encounter: Payer: Self-pay | Admitting: *Deleted

## 2011-08-26 DIAGNOSIS — N529 Male erectile dysfunction, unspecified: Secondary | ICD-10-CM | POA: Insufficient documentation

## 2011-08-26 DIAGNOSIS — R3915 Urgency of urination: Secondary | ICD-10-CM | POA: Insufficient documentation

## 2012-02-04 ENCOUNTER — Ambulatory Visit (INDEPENDENT_AMBULATORY_CARE_PROVIDER_SITE_OTHER): Payer: 59 | Admitting: Physician Assistant

## 2012-02-04 VITALS — BP 116/79 | HR 61 | Wt 230.0 lb

## 2012-02-04 DIAGNOSIS — R51 Headache: Secondary | ICD-10-CM

## 2012-02-04 DIAGNOSIS — Z8673 Personal history of transient ischemic attack (TIA), and cerebral infarction without residual deficits: Secondary | ICD-10-CM

## 2012-02-04 DIAGNOSIS — Z23 Encounter for immunization: Secondary | ICD-10-CM

## 2012-02-04 DIAGNOSIS — Z298 Encounter for other specified prophylactic measures: Secondary | ICD-10-CM

## 2012-02-04 DIAGNOSIS — R42 Dizziness and giddiness: Secondary | ICD-10-CM

## 2012-02-04 DIAGNOSIS — R413 Other amnesia: Secondary | ICD-10-CM

## 2012-02-04 MED ORDER — FLUTICASONE PROPIONATE 50 MCG/ACT NA SUSP: 1.0000 | Freq: Every day | NASAL | Status: DC

## 2012-02-04 NOTE — Patient Instructions (Addendum)
MRI to schedule. Will talk to neurologist and find out if echo was done.   Sent nasal spray.

## 2012-02-04 NOTE — Progress Notes (Signed)
  Subjective:    Patient ID: Stephen King, male    DOB: [birth year], 61 y.o.   MRN: [id number]  HPI Patient is a 61 yo male who presents to the clinic with dull headache, dizziness, memory loss, confusion, boggy feeling for 2 weeks. About 2 years ago patient had a stroke. He has great cholesterol, his blood pressure is great, he does not have diabetes. Carotid dopplers and extensive testing was done to find cause.He remembers a Dr. [name] Dills from [facility] telling him he "had a whole in his heart" that could have thrown a clot. He did not think it was worth repair. He denies any weakness of extremities. He has had a few episodes of CP with exertion but been sporadic over the last 3 weeks. Denies any slurred speech or vision blurriness. He is taking his ASA regularly.    Review of Systems     Objective:   Physical Exam  Constitutional: He is oriented to person, place, and time. He appears well-developed and well-nourished.  HENT:  Head: Normocephalic and atraumatic.  Right Ear: External ear normal.  Left Ear: External ear normal.  Nose: Nose normal.  Mouth/Throat: Oropharynx is clear and moist. No oropharyngeal exudate.  Eyes: Conjunctivae normal and EOM are normal. Pupils are equal, round, and reactive to light.  Neck: Normal range of motion. Neck supple.       No carotid bruits.  Cardiovascular: Normal rate, regular rhythm, normal heart sounds and intact distal pulses.   Pulmonary/Chest: Effort normal and breath sounds normal.  Neurological: He is alert and oriented to person, place, and time. He has normal reflexes. He displays normal reflexes. No cranial nerve deficit. He exhibits normal muscle tone. Coordination normal.       No focal neurological deficits. All cranial nerves intact.  Negative Gilberto Better.  Skin: Skin is warm and dry.  Psychiatric: He has a normal mood and affect. His behavior is normal.          Assessment & Plan:  Stroke symptoms- EKG sinus  bradycardia/no acute changes/no ST elevations/good R wave progression. Will get a STAT MRI of brain to rule out stroke. Will consider echo of heart. Left msg with neurology to see if they have diagnosis of questionable "hole in heart". If no clear dx then will order echo of heart with bubbles. Continue on ASA. Go to ER with any weakness of extremities or worsening of stroke symptoms. Consider Flonase could help with dizziness if related to nasal congestion.

## 2012-02-05 ENCOUNTER — Telehealth: Payer: Self-pay | Admitting: *Deleted

## 2012-02-05 ENCOUNTER — Other Ambulatory Visit: Payer: Self-pay | Admitting: Physician Assistant

## 2012-02-05 DIAGNOSIS — R51 Headache: Secondary | ICD-10-CM

## 2012-02-05 NOTE — Telephone Encounter (Signed)
Appt sch'ed for MRI of the Brain w/o contrast @ [facility] ([address])  for 02/06/12 @ 8:45am. Pt notified of the appt. Auth# [id number]

## 2012-02-05 NOTE — Addendum Note (Signed)
Addended by: Chalmers Cater on: 02/05/2012 11:35 AM   Modules accepted: Orders

## 2012-02-06 ENCOUNTER — Encounter: Payer: Self-pay | Admitting: *Deleted

## 2012-02-06 ENCOUNTER — Ambulatory Visit
Admission: RE | Admit: 2012-02-06 | Discharge: 2012-02-06 | Disposition: A | Discharge status: Home / Self Care | Payer: 59 | Source: Ambulatory Visit | Attending: Physician Assistant | Admitting: Physician Assistant

## 2012-02-06 DIAGNOSIS — R42 Dizziness and giddiness: Secondary | ICD-10-CM

## 2012-02-06 DIAGNOSIS — Z8673 Personal history of transient ischemic attack (TIA), and cerebral infarction without residual deficits: Secondary | ICD-10-CM

## 2012-02-06 DIAGNOSIS — R51 Headache: Secondary | ICD-10-CM

## 2012-02-06 DIAGNOSIS — R413 Other amnesia: Secondary | ICD-10-CM

## 2012-02-09 ENCOUNTER — Encounter: Payer: Self-pay | Admitting: *Deleted

## 2012-07-03 ENCOUNTER — Other Ambulatory Visit: Payer: Self-pay | Admitting: Physician Assistant

## 2012-07-26 ENCOUNTER — Ambulatory Visit: Payer: 59 | Admitting: Physician Assistant

## 2012-07-28 ENCOUNTER — Encounter: Payer: Self-pay | Admitting: Physician Assistant

## 2012-07-28 ENCOUNTER — Ambulatory Visit (INDEPENDENT_AMBULATORY_CARE_PROVIDER_SITE_OTHER): Payer: 59 | Admitting: Physician Assistant

## 2012-07-28 VITALS — BP 104/65 | HR 87 | Wt 230.0 lb

## 2012-07-28 DIAGNOSIS — G589 Mononeuropathy, unspecified: Secondary | ICD-10-CM

## 2012-07-28 DIAGNOSIS — R51 Headache: Secondary | ICD-10-CM

## 2012-07-28 DIAGNOSIS — R209 Unspecified disturbances of skin sensation: Secondary | ICD-10-CM

## 2012-07-28 DIAGNOSIS — R200 Anesthesia of skin: Secondary | ICD-10-CM

## 2012-07-28 DIAGNOSIS — R202 Paresthesia of skin: Secondary | ICD-10-CM

## 2012-07-28 DIAGNOSIS — J3489 Other specified disorders of nose and nasal sinuses: Secondary | ICD-10-CM

## 2012-07-28 DIAGNOSIS — R0981 Nasal congestion: Secondary | ICD-10-CM

## 2012-07-28 DIAGNOSIS — G629 Polyneuropathy, unspecified: Secondary | ICD-10-CM

## 2012-07-28 MED ORDER — GABAPENTIN 100 MG PO CAPS: ORAL_CAPSULE | ORAL | Status: DC

## 2012-07-28 MED ORDER — FLUTICASONE PROPIONATE 50 MCG/ACT NA SUSP: 2.0000 | Freq: Every day | NASAL | Status: DC

## 2012-07-28 NOTE — Patient Instructions (Addendum)
Follow-up in 6 weeks

## 2012-07-28 NOTE — Progress Notes (Signed)
  Subjective:    Patient ID: Stephen King, male    DOB: [birth year], 62 y.o.   MRN: [id number]  HPI Patient presents to the clinic for refill on flonase. Pt uses every night for nasal congestion and he states it works great.   Upon questioning he has started having HA's daily. He wakes up with then or they can come any time of day. There are worse when he is stressed. They occur in the temples and behind he eyes. Sometimes they can be one side or both. He doesn't take anything to make better. He has elminated caffeine out of his diet. Denies any light or sound sensitivities. Denies any nausea. He continues to have numbness and tingling of both hands. Occurs mostly in the middle, ring, and pinky fingers. Worse at night. Did have surgery done on C-spine in the past. Denies any neck pain. 3 months ago called neurologist and they did MRI and said he had not had a stroke. He does have a history of stroke.      Review of Systems     Objective:   Physical Exam  Constitutional: He is oriented to person, place, and time. He appears well-developed and well-nourished.  HENT:  Head: Normocephalic and atraumatic.  Cardiovascular: Normal rate, regular rhythm and normal heart sounds.   Pulmonary/Chest: Effort normal and breath sounds normal.  Musculoskeletal:  Negative tinels or phalens of bilateral wrist.  Neurological: He is alert and oriented to person, place, and time. No cranial nerve deficit. Coordination normal.  Skin: Skin is warm and dry.  Psychiatric: He has a normal mood and affect. His behavior is normal.          Assessment & Plan:  Nasal congestion- Refilled Flonase. Suggested added zyrtec or claritin daily to see if that also helped.   Neuropathy/headaches- unclear etiology of HA's. very resistant to trying medication daily for HA's. Suggest he try excedrin or ibprofen for HA's at onset and she if helped. Due to pain in temple will check ESR to see if signs of giant cell artheritis.  I would like to get EMG's of bilateral hands/arms to test nerve function. Started on neurotonin at bedtime to see if would help with numbness and tingling. Follow up in 6 weeks. Call sooner if headaches worsen.

## 2012-09-13 ENCOUNTER — Other Ambulatory Visit: Payer: Self-pay | Admitting: Physician Assistant

## 2012-09-13 NOTE — Telephone Encounter (Signed)
Patient needs follow up appointment

## 2013-01-27 ENCOUNTER — Other Ambulatory Visit: Payer: Self-pay

## 2013-05-18 ENCOUNTER — Ambulatory Visit (INDEPENDENT_AMBULATORY_CARE_PROVIDER_SITE_OTHER): Payer: Self-pay | Admitting: *Deleted

## 2013-05-18 DIAGNOSIS — I6350 Cerebral infarction due to unspecified occlusion or stenosis of unspecified cerebral artery: Secondary | ICD-10-CM

## 2013-05-18 DIAGNOSIS — I639 Cerebral infarction, unspecified: Secondary | ICD-10-CM

## 2013-05-18 LAB — LIPID PANEL
HDL: 56 mg/dL (ref 35–70)
LDL CALC: 103 mg/dL
Triglycerides: 62 mg/dL (ref 40–160)

## 2013-05-18 LAB — HEPATIC FUNCTION PANEL: ALT: 21 U/L (ref 10–40)

## 2013-05-18 NOTE — Progress Notes (Signed)
Participant in the office today for his 3rd. Annual visit for the IRIS Trial. MMSE completed without any problems.  Blood was drawn and shipped. Old bottle of study medication was mailed back to the coordinating center. Participant to continue with follow-up visits per IRIS protocol.

## 2013-05-31 ENCOUNTER — Ambulatory Visit (INDEPENDENT_AMBULATORY_CARE_PROVIDER_SITE_OTHER): Payer: 59 | Admitting: Physician Assistant

## 2013-05-31 ENCOUNTER — Encounter: Payer: Self-pay | Admitting: Physician Assistant

## 2013-05-31 VITALS — BP 105/65 | HR 67 | Wt 218.0 lb

## 2013-05-31 DIAGNOSIS — R220 Localized swelling, mass and lump, head: Secondary | ICD-10-CM

## 2013-05-31 DIAGNOSIS — R221 Localized swelling, mass and lump, neck: Secondary | ICD-10-CM

## 2013-05-31 NOTE — Progress Notes (Signed)
   Subjective:    Patient ID: Stephen King, male    DOB: 03-Jul-1950, 63 y.o.   MRN: [id number]  HPI Pt is a 63 yo male who presents to the clinic with a mass he felt on the right side of his jaw for last 3-4 days. He feels like it might be getting bigger but doesn't really know. He had a cold around feb 21st but all symptoms have resolved. Denies any ST, ear pain, fever, chills, night sweats, sinus pressure. He has had intentional weight loss. Denies any difficultly swallowing. Never had anything like this before Area is sore to touch but not painful. Not tried anything by mouth or compresses to make better.    Review of Systems     Objective:   Physical Exam  Constitutional: He is oriented to person, place, and time. He appears well-developed and well-nourished.  HENT:  Head: Normocephalic and atraumatic.  Right Ear: External ear normal.  Left Ear: External ear normal.  Nose: Nose normal.  Mouth/Throat: Oropharynx is clear and moist.  TM's clear bilaterally.  Negative maxillary tenderness.   Eyes: Conjunctivae are normal. Right eye exhibits no discharge. Left eye exhibits no discharge.  Neck: Normal range of motion. Neck supple.  Very mobile marble sized firm but not fixed lesion over right jaw below ear.   No other lymphadenopathy.     Cardiovascular: Normal rate, regular rhythm and normal heart sounds.   Pulmonary/Chest: Effort normal and breath sounds normal. He has no wheezes.  Neurological: He is alert and oriented to person, place, and time.  Skin: Skin is dry.  Psychiatric: He has a normal mood and affect. His behavior is normal.          Assessment & Plan:  Right jaw mass- Needs ultrasound. Unclear etiology very mobile and almost feels like a salivary cyst but not infected or painful could also be reactive lymph node. Will also get cbc with diff. Recommended ibuprofen to help if inflamed and warm compresses if salivary cyst. Will continue to monitor.

## 2013-05-31 NOTE — Patient Instructions (Addendum)
Will schedule ultrasound of neck.  Warm compresses to the neck.  Ibuprofen 200mg  to 800mg  up to three times a day.

## 2013-06-01 LAB — CBC WITH DIFFERENTIAL/PLATELET
Basophils Absolute: 0.1 10*3/uL (ref 0.0–0.1)
Basophils Relative: 1 % (ref 0–1)
Eosinophils Absolute: 0.1 10*3/uL (ref 0.0–0.7)
Eosinophils Relative: 1 % (ref 0–5)
HEMATOCRIT: 43.1 % (ref 39.0–52.0)
HEMOGLOBIN: 14.6 g/dL (ref 13.0–17.0)
LYMPHS ABS: 2.5 10*3/uL (ref 0.7–4.0)
LYMPHS PCT: 38 % (ref 12–46)
MCH: 31.2 pg (ref 26.0–34.0)
MCHC: 33.9 g/dL (ref 30.0–36.0)
MCV: 92.1 fL (ref 78.0–100.0)
MONO ABS: 0.5 10*3/uL (ref 0.1–1.0)
MONOS PCT: 8 % (ref 3–12)
NEUTROS ABS: 3.4 10*3/uL (ref 1.7–7.7)
Neutrophils Relative %: 52 % (ref 43–77)
Platelets: 266 10*3/uL (ref 150–400)
RBC: 4.68 MIL/uL (ref 4.22–5.81)
RDW: 14.3 % (ref 11.5–15.5)
WBC: 6.5 10*3/uL (ref 4.0–10.5)

## 2013-06-06 ENCOUNTER — Encounter: Payer: Self-pay | Admitting: Physician Assistant

## 2013-06-06 ENCOUNTER — Ambulatory Visit (INDEPENDENT_AMBULATORY_CARE_PROVIDER_SITE_OTHER): Payer: 59

## 2013-06-06 DIAGNOSIS — L723 Sebaceous cyst: Secondary | ICD-10-CM

## 2013-06-06 DIAGNOSIS — R221 Localized swelling, mass and lump, neck: Secondary | ICD-10-CM | POA: Insufficient documentation

## 2013-06-07 ENCOUNTER — Encounter: Payer: Self-pay | Admitting: *Deleted

## 2013-07-07 ENCOUNTER — Other Ambulatory Visit: Payer: Self-pay | Admitting: Physician Assistant

## 2013-07-13 ENCOUNTER — Ambulatory Visit (INDEPENDENT_AMBULATORY_CARE_PROVIDER_SITE_OTHER): Payer: 59

## 2013-07-13 ENCOUNTER — Encounter: Payer: Self-pay | Admitting: Physician Assistant

## 2013-07-13 ENCOUNTER — Ambulatory Visit (INDEPENDENT_AMBULATORY_CARE_PROVIDER_SITE_OTHER): Payer: 59 | Admitting: Physician Assistant

## 2013-07-13 ENCOUNTER — Other Ambulatory Visit: Payer: Self-pay | Admitting: Physician Assistant

## 2013-07-13 VITALS — BP 109/69 | HR 63 | Ht 70.0 in | Wt 219.0 lb

## 2013-07-13 DIAGNOSIS — S46009A Unspecified injury of muscle(s) and tendon(s) of the rotator cuff of unspecified shoulder, initial encounter: Secondary | ICD-10-CM

## 2013-07-13 DIAGNOSIS — M25511 Pain in right shoulder: Secondary | ICD-10-CM

## 2013-07-13 DIAGNOSIS — M898X9 Other specified disorders of bone, unspecified site: Secondary | ICD-10-CM

## 2013-07-13 DIAGNOSIS — S4980XA Other specified injuries of shoulder and upper arm, unspecified arm, initial encounter: Secondary | ICD-10-CM

## 2013-07-13 DIAGNOSIS — R221 Localized swelling, mass and lump, neck: Secondary | ICD-10-CM

## 2013-07-13 DIAGNOSIS — S46909A Unspecified injury of unspecified muscle, fascia and tendon at shoulder and upper arm level, unspecified arm, initial encounter: Secondary | ICD-10-CM

## 2013-07-13 DIAGNOSIS — M25519 Pain in unspecified shoulder: Secondary | ICD-10-CM

## 2013-07-13 DIAGNOSIS — R220 Localized swelling, mass and lump, head: Secondary | ICD-10-CM

## 2013-07-13 MED ORDER — MELOXICAM 15 MG PO TABS: 15.0000 mg | ORAL_TABLET | Freq: Every day | ORAL | Status: DC

## 2013-07-13 NOTE — Telephone Encounter (Signed)
Are you ok with a 90 day supply?

## 2013-07-13 NOTE — Patient Instructions (Addendum)
Will get CT of neck.   Follow up in 4 weeks.   Impingement Syndrome, Rotator Cuff, Bursitis with Rehab Impingement syndrome is a condition that involves inflammation of the tendons of the rotator cuff and the subacromial bursa, that causes pain in the shoulder. The rotator cuff consists of four tendons and muscles that control much of the shoulder and upper arm function. The subacromial bursa is a fluid filled sac that helps reduce friction between the rotator cuff and one of the bones of the shoulder (acromion). Impingement syndrome is usually an overuse injury that causes swelling of the bursa (bursitis), swelling of the tendon (tendonitis), and/or a tear of the tendon (strain). Strains are classified into three categories. Grade 1 strains cause pain, but the tendon is not lengthened. Grade 2 strains include a lengthened ligament, due to the ligament being stretched or partially ruptured. With grade 2 strains there is still function, although the function may be decreased. Grade 3 strains include a complete tear of the tendon or muscle, and function is usually impaired. SYMPTOMS   Pain around the shoulder, often at the outer portion of the upper arm.  Pain that gets worse with shoulder function, especially when reaching overhead or lifting.  Sometimes, aching when not using the arm.  Pain that wakes you up at night.  Sometimes, tenderness, swelling, warmth, or redness over the affected area.  Loss of strength.  Limited motion of the shoulder, especially reaching behind the back (to the back pocket or to unhook bra) or across your body.  Crackling sound (crepitation) when moving the arm.  Biceps tendon pain and inflammation (in the front of the shoulder). Worse when bending the elbow or lifting. CAUSES  Impingement syndrome is often an overuse injury, in which chronic (repetitive) motions cause the tendons or bursa to become inflamed. A strain occurs when a force is paced on the tendon or  muscle that is greater than it can withstand. Common mechanisms of injury include: Stress from sudden increase in duration, frequency, or intensity of training.  Direct hit (trauma) to the shoulder.  Aging, erosion of the tendon with normal use.  Bony bump on shoulder (acromial spur). RISK INCREASES WITH:  Contact sports (football, wrestling, boxing).  Throwing sports (baseball, tennis, volleyball).  Weightlifting and bodybuilding.  Heavy labor.  Previous injury to the rotator cuff, including impingement.  Poor shoulder strength and flexibility.  Failure to warm up properly before activity.  Inadequate protective equipment.  Old age.  Bony bump on shoulder (acromial spur). PREVENTION   Warm up and stretch properly before activity.  Allow for adequate recovery between workouts.  Maintain physical fitness:  Strength, flexibility, and endurance.  Cardiovascular fitness.  Learn and use proper exercise technique. PROGNOSIS  If treated properly, impingement syndrome usually goes away within 6 weeks. Sometimes surgery is required.  RELATED COMPLICATIONS   Longer healing time if not properly treated, or if not given enough time to heal.  Recurring symptoms, that result in a chronic condition.  Shoulder stiffness, frozen shoulder, or loss of motion.  Rotator cuff tendon tear.  Recurring symptoms, especially if activity is resumed too soon, with overuse, with a direct blow, or when using poor technique. TREATMENT  Treatment first involves the use of ice and medicine, to reduce pain and inflammation. The use of strengthening and stretching exercises may help reduce pain with activity. These exercises may be performed at home or with a therapist. If non-surgical treatment is unsuccessful after more than 6 months, surgery  may be advised. After surgery and rehabilitation, activity is usually possible in 3 months.  MEDICATION  If pain medicine is needed, nonsteroidal  anti-inflammatory medicines (aspirin and ibuprofen), or other minor pain relievers (acetaminophen), are often advised.  Do not take pain medicine for 7 days before surgery.  Prescription pain relievers may be given, if your caregiver thinks they are needed. Use only as directed and only as much as you need.  Corticosteroid injections may be given by your caregiver. These injections should be reserved for the most serious cases, because they may only be given a certain number of times. HEAT AND COLD  Cold treatment (icing) should be applied for 10 to 15 minutes every 2 to 3 hours for inflammation and pain, and immediately after activity that aggravates your symptoms. Use ice packs or an ice massage.  Heat treatment may be used before performing stretching and strengthening activities prescribed by your caregiver, physical therapist, or athletic trainer. Use a heat pack or a warm water soak. SEEK MEDICAL CARE IF:   Symptoms get worse or do not improve in 4 to 6 weeks, despite treatment.  New, unexplained symptoms develop. (Drugs used in treatment may produce side effects.) EXERCISES  RANGE OF MOTION (ROM) AND STRETCHING EXERCISES - Impingement Syndrome (Rotator Cuff  Tendinitis, Bursitis) These exercises may help you when beginning to rehabilitate your injury. Your symptoms may go away with or without further involvement from your physician, physical therapist or athletic trainer. While completing these exercises, remember:   Restoring tissue flexibility helps normal motion to return to the joints. This allows healthier, less painful movement and activity.  An effective stretch should be held for at least 30 seconds.  A stretch should never be painful. You should only feel a gentle lengthening or release in the stretched tissue. STRETCH  Flexion, Standing  Stand with good posture. With an underhand grip on your right / left hand, and an overhand grip on the opposite hand, grasp a  broomstick or cane so that your hands are a little more than shoulder width apart.  Keeping your right / left elbow straight and shoulder muscles relaxed, push the stick with your opposite hand, to raise your right / left arm in front of your body and then overhead. Raise your arm until you feel a stretch in your right / left shoulder, but before you have increased shoulder pain.  Try to avoid shrugging your right / left shoulder as your arm rises, by keeping your shoulder blade tucked down and toward your mid-back spine. Hold for __________ seconds.  Slowly return to the starting position. Repeat __________ times. Complete this exercise __________ times per day. STRETCH  Abduction, Supine  Lie on your back. With an underhand grip on your right / left hand and an overhand grip on the opposite hand, grasp a broomstick or cane so that your hands are a little more than shoulder width apart.  Keeping your right / left elbow straight and your shoulder muscles relaxed, push the stick with your opposite hand, to raise your right / left arm out to the side of your body and then overhead. Raise your arm until you feel a stretch in your right / left shoulder, but before you have increased shoulder pain.  Try to avoid shrugging your right / left shoulder as your arm rises, by keeping your shoulder blade tucked down and toward your mid-back spine. Hold for __________ seconds.  Slowly return to the starting position. Repeat __________ times. Complete  this exercise __________ times per day. ROM  Flexion, Active-Assisted  Lie on your back. You may bend your knees for comfort.  Grasp a broomstick or cane so your hands are about shoulder width apart. Your right / left hand should grip the end of the stick, so that your hand is positioned "thumbs-up," as if you were about to shake hands.  Using your healthy arm to lead, raise your right / left arm overhead, until you feel a gentle stretch in your shoulder.  Hold for __________ seconds.  Use the stick to assist in returning your right / left arm to its starting position. Repeat __________ times. Complete this exercise __________ times per day.  ROM - Internal Rotation, Supine   Lie on your back on a firm surface. Place your right / left elbow about 60 degrees away from your side. Elevate your elbow with a folded towel, so that the elbow and shoulder are the same height.  Using a broomstick or cane and your strong arm, pull your right / left hand toward your body until you feel a gentle stretch, but no increase in your shoulder pain. Keep your shoulder and elbow in place throughout the exercise.  Hold for __________ seconds. Slowly return to the starting position. Repeat __________ times. Complete this exercise __________ times per day. STRETCH - Internal Rotation  Place your right / left hand behind your back, palm up.  Throw a towel or belt over your opposite shoulder. Grasp the towel with your right / left hand.  While keeping an upright posture, gently pull up on the towel, until you feel a stretch in the front of your right / left shoulder.  Avoid shrugging your right / left shoulder as your arm rises, by keeping your shoulder blade tucked down and toward your mid-back spine.  Hold for __________ seconds. Release the stretch, by lowering your healthy hand. Repeat __________ times. Complete this exercise __________ times per day. ROM - Internal Rotation   Using an underhand grip, grasp a stick behind your back with both hands.  While standing upright with good posture, slide the stick up your back until you feel a mild stretch in the front of your shoulder.  Hold for __________ seconds. Slowly return to your starting position. Repeat __________ times. Complete this exercise __________ times per day.  STRETCH  Posterior Shoulder Capsule   Stand or sit with good posture. Grasp your right / left elbow and draw it across your chest,  keeping it at the same height as your shoulder.  Pull your elbow, so your upper arm comes in closer to your chest. Pull until you feel a gentle stretch in the back of your shoulder.  Hold for __________ seconds. Repeat __________ times. Complete this exercise __________ times per day. STRENGTHENING EXERCISES - Impingement Syndrome (Rotator Cuff Tendinitis, Bursitis) These exercises may help you when beginning to rehabilitate your injury. They may resolve your symptoms with or without further involvement from your physician, physical therapist or athletic trainer. While completing these exercises, remember:  Muscles can gain both the endurance and the strength needed for everyday activities through controlled exercises.  Complete these exercises as instructed by your physician, physical therapist or athletic trainer. Increase the resistance and repetitions only as guided.  You may experience muscle soreness or fatigue, but the pain or discomfort you are trying to eliminate should never worsen during these exercises. If this pain does get worse, stop and make sure you are following the directions exactly.  If the pain is still present after adjustments, discontinue the exercise until you can discuss the trouble with your clinician.  During your recovery, avoid activity or exercises which involve actions that place your injured hand or elbow above your head or behind your back or head. These positions stress the tissues which you are trying to heal. STRENGTH - Scapular Depression and Adduction   With good posture, sit on a firm chair. Support your arms in front of you, with pillows, arm rests, or on a table top. Have your elbows in line with the sides of your body.  Gently draw your shoulder blades down and toward your mid-back spine. Gradually increase the tension, without tensing the muscles along the top of your shoulders and the back of your neck.  Hold for __________ seconds. Slowly release  the tension and relax your muscles completely before starting the next repetition.  After you have practiced this exercise, remove the arm support and complete the exercise in standing as well as sitting position. Repeat __________ times. Complete this exercise __________ times per day.  STRENGTH - Shoulder Abductors, Isometric  With good posture, stand or sit about 4-6 inches from a wall, with your right / left side facing the wall.  Bend your right / left elbow. Gently press your right / left elbow into the wall. Increase the pressure gradually, until you are pressing as hard as you can, without shrugging your shoulder or increasing any shoulder discomfort.  Hold for __________ seconds.  Release the tension slowly. Relax your shoulder muscles completely before you begin the next repetition. Repeat __________ times. Complete this exercise __________ times per day.  STRENGTH - External Rotators, Isometric  Keep your right / left elbow at your side and bend it 90 degrees.  Step into a door frame so that the outside of your right / left wrist can press against the door frame without your upper arm leaving your side.  Gently press your right / left wrist into the door frame, as if you were trying to swing the back of your hand away from your stomach. Gradually increase the tension, until you are pressing as hard as you can, without shrugging your shoulder or increasing any shoulder discomfort.  Hold for __________ seconds.  Release the tension slowly. Relax your shoulder muscles completely before you begin the next repetition. Repeat __________ times. Complete this exercise __________ times per day.  STRENGTH - Supraspinatus   Stand or sit with good posture. Grasp a __________ weight, or an exercise band or tubing, so that your hand is "thumbs-up," like you are shaking hands.  Slowly lift your right / left arm in a "V" away from your thigh, diagonally into the space between your side and  straight ahead. Lift your hand to shoulder height or as far as you can, without increasing any shoulder pain. At first, many people do not lift their hands above shoulder height.  Avoid shrugging your right / left shoulder as your arm rises, by keeping your shoulder blade tucked down and toward your mid-back spine.  Hold for __________ seconds. Control the descent of your hand, as you slowly return to your starting position. Repeat __________ times. Complete this exercise __________ times per day.  STRENGTH - External Rotators  Secure a rubber exercise band or tubing to a fixed object (table, pole) so that it is at the same height as your right / left elbow when you are standing or sitting on a firm surface.  Stand  or sit so that the secured exercise band is at your uninjured side.  Bend your right / left elbow 90 degrees. Place a folded towel or small pillow under your right / left arm, so that your elbow is a few inches away from your side.  Keeping the tension on the exercise band, pull it away from your body, as if pivoting on your elbow. Be sure to keep your body steady, so that the movement is coming only from your rotating shoulder.  Hold for __________ seconds. Release the tension in a controlled manner, as you return to the starting position. Repeat __________ times. Complete this exercise __________ times per day.  STRENGTH - Internal Rotators   Secure a rubber exercise band or tubing to a fixed object (table, pole) so that it is at the same height as your right / left elbow when you are standing or sitting on a firm surface.  Stand or sit so that the secured exercise band is at your right / left side.  Bend your elbow 90 degrees. Place a folded towel or small pillow under your right / left arm so that your elbow is a few inches away from your side.  Keeping the tension on the exercise band, pull it across your body, toward your stomach. Be sure to keep your body steady, so that  the movement is coming only from your rotating shoulder.  Hold for __________ seconds. Release the tension in a controlled manner, as you return to the starting position. Repeat __________ times. Complete this exercise __________ times per day.  STRENGTH - Scapular Protractors, Standing   Stand arms length away from a wall. Place your hands on the wall, keeping your elbows straight.  Begin by dropping your shoulder blades down and toward your mid-back spine.  To strengthen your protractors, keep your shoulder blades down, but slide them forward on your rib cage. It will feel as if you are lifting the back of your rib cage away from the wall. This is a subtle motion and can be challenging to complete. Ask your caregiver for further instruction, if you are not sure you are doing the exercise correctly.  Hold for __________ seconds. Slowly return to the starting position, resting the muscles completely before starting the next repetition. Repeat __________ times. Complete this exercise __________ times per day. STRENGTH - Scapular Protractors, Supine  Lie on your back on a firm surface. Extend your right / left arm straight into the air while holding a __________ weight in your hand.  Keeping your head and back in place, lift your shoulder off the floor.  Hold for __________ seconds. Slowly return to the starting position, and allow your muscles to relax completely before starting the next repetition. Repeat __________ times. Complete this exercise __________ times per day. STRENGTH - Scapular Protractors, Quadruped  Get onto your hands and knees, with your shoulders directly over your hands (or as close as you can be, comfortably).  Keeping your elbows locked, lift the back of your rib cage up into your shoulder blades, so your mid-back rounds out. Keep your neck muscles relaxed.  Hold this position for __________ seconds. Slowly return to the starting position and allow your muscles to  relax completely before starting the next repetition. Repeat __________ times. Complete this exercise __________ times per day.  STRENGTH - Scapular Retractors  Secure a rubber exercise band or tubing to a fixed object (table, pole), so that it is at the height of your shoulders  when you are either standing, or sitting on a firm armless chair.  With a palm down grip, grasp an end of the band in each hand. Straighten your elbows and lift your hands straight in front of you, at shoulder height. Step back, away from the secured end of the band, until it becomes tense.  Squeezing your shoulder blades together, draw your elbows back toward your sides, as you bend them. Keep your upper arms lifted away from your body throughout the exercise.  Hold for __________ seconds. Slowly ease the tension on the band, as you reverse the directions and return to the starting position. Repeat __________ times. Complete this exercise __________ times per day. STRENGTH - Shoulder Extensors   Secure a rubber exercise band or tubing to a fixed object (table, pole) so that it is at the height of your shoulders when you are either standing, or sitting on a firm armless chair.  With a thumbs-up grip, grasp an end of the band in each hand. Straighten your elbows and lift your hands straight in front of you, at shoulder height. Step back, away from the secured end of the band, until it becomes tense.  Squeezing your shoulder blades together, pull your hands down to the sides of your thighs. Do not allow your hands to go behind you.  Hold for __________ seconds. Slowly ease the tension on the band, as you reverse the directions and return to the starting position. Repeat __________ times. Complete this exercise __________ times per day.  STRENGTH - Scapular Retractors and External Rotators   Secure a rubber exercise band or tubing to a fixed object (table, pole) so that it is at the height as your shoulders, when you are  either standing, or sitting on a firm armless chair.  With a palm down grip, grasp an end of the band in each hand. Bend your elbows 90 degrees and lift your elbows to shoulder height, at your sides. Step back, away from the secured end of the band, until it becomes tense.  Squeezing your shoulder blades together, rotate your shoulders so that your upper arms and elbows remain stationary, but your fists travel upward to head height.  Hold for __________ seconds. Slowly ease the tension on the band, as you reverse the directions and return to the starting position. Repeat __________ times. Complete this exercise __________ times per day.  STRENGTH - Scapular Retractors and External Rotators, Rowing   Secure a rubber exercise band or tubing to a fixed object (table, pole) so that it is at the height of your shoulders, when you are either standing, or sitting on a firm armless chair.  With a palm down grip, grasp an end of the band in each hand. Straighten your elbows and lift your hands straight in front of you, at shoulder height. Step back, away from the secured end of the band, until it becomes tense.  Step 1: Squeeze your shoulder blades together. Bending your elbows, draw your hands to your chest, as if you are rowing a boat. At the end of this motion, your hands and elbow should be at shoulder height and your elbows should be out to your sides.  Step 2: Rotate your shoulders, to raise your hands above your head. Your forearms should be vertical and your upper arms should be horizontal.  Hold for __________ seconds. Slowly ease the tension on the band, as you reverse the directions and return to the starting position. Repeat __________ times. Complete this  exercise __________ times per day.  STRENGTH  Scapular Depressors  Find a sturdy chair without wheels, such as a dining room chair.  Keeping your feet on the floor, and your hands on the chair arms, lift your bottom up from the seat, and  lock your elbows.  Keeping your elbows straight, allow gravity to pull your body weight down. Your shoulders will rise toward your ears.  Raise your body against gravity by drawing your shoulder blades down your back, shortening the distance between your shoulders and ears. Although your feet should always maintain contact with the floor, your feet should progressively support less body weight, as you get stronger.  Hold for __________ seconds. In a controlled and slow manner, lower your body weight to begin the next repetition. Repeat __________ times. Complete this exercise __________ times per day.  Document Released: 03/10/2005 Document Revised: 06/02/2011 Document Reviewed: 06/22/2008 [facility] Patient Information 2014 [place], [place].

## 2013-07-13 NOTE — Progress Notes (Signed)
   Subjective:    Patient ID: Stephen King, male    DOB: 12-May-1950, 63 y.o.   MRN: [id number]  HPI Patient is a 63 year old male who presents to the clinic with one and a half weeks of right shoulder pain. He reports the pain to be 2/95 with certain movements. 2 weeks ago it went on a canoeing training weekend. Pain started Sunday after training weekend. He felt is normal stiffness after canoeing but progressed to these symptoms. He is unable to lift anything. Trouble washing his hair in shower. Tried ibuprofen a couple of times and helped some but does not take regularly. He wants to get better because he is going on a 2 week canoeing trip in July. Worse with movement of shoulder. Better when lays on shoulder and applys pressure.     Mass of the right side of neck still present today. Does not seem to be growing. Does not cause any pain or discomfort.   Review of Systems     Objective:   Physical Exam  Constitutional: He is oriented to person, place, and time. He appears well-developed and well-nourished.  HENT:  Head: Normocephalic and atraumatic.  Cardiovascular: Normal rate, regular rhythm and normal heart sounds.   Pulmonary/Chest: Effort normal and breath sounds normal.  Musculoskeletal:  Right shoulder limited ROM due to pain to 90 degrees abduction. Passive ROM was also limited to pain at 120 degrees.  Positive hawkins.  Positive Neers.  Positive empty can.  Worsen pain with external ROM.  Strength 4/5 of right arm.   No brusing or swelling over right shoulder. Negative for pain to palpitation over clavicle, acromion, coracoid process.   Neurological: He is alert and oriented to person, place, and time.  Skin: Skin is dry.  Psychiatric: He has a normal mood and affect. His behavior is normal.          Assessment & Plan:  Right shoulder pain- pt is certainly presenting with some suspicion of rotator cuff injury and signs consistent with impingement syndrome.  Xray of  right shoulder obtained today. subacromial injection was given today. Mobic to take daily for pain and inflammation given. Home exercises were given to start. May consider formal PT if no improvement. Consider imaging with MRI if in 4 weeks no improvement.   Xray- negative for acute abnormalities. Some minimal spurring of AC joint.   Subacromial Shoulder Injection Procedure Note  Pre-operative Diagnosis: right shoulder pain  Post-operative Diagnosis: same  Indications: pain and loss of ROM  Anesthesia: ethyl chloride   Procedure Details   Verbal consent was obtained for the procedure. The shoulder was prepped with Betadine and the skin was anesthetized. A 27 gauge needle was advanced into the subacromial space through posterior approach without difficulty  The space was then injected with 9 ml 1% lidocaine and 1 ml of depo medrol 40mg . The injection site was cleansed with isopropyl alcohol and a dressing was applied.  Complications:  None; patient tolerated the procedure well.   Mass of right neck- still present. we'll get CT scan to further evaluate.

## 2013-07-14 ENCOUNTER — Telehealth: Payer: Self-pay | Admitting: *Deleted

## 2013-07-14 DIAGNOSIS — R221 Localized swelling, mass and lump, neck: Principal | ICD-10-CM

## 2013-07-14 DIAGNOSIS — R220 Localized swelling, mass and lump, head: Secondary | ICD-10-CM

## 2013-07-14 NOTE — Telephone Encounter (Signed)
PT needs BUN and Creatnine due to contrast needed for scan.  Oscar La, LPN

## 2013-07-14 NOTE — Telephone Encounter (Signed)
PA obtained for CT Neck w/contrast.  Auth # [id number]. Exp 08/28/13.  Oscar La, LPN

## 2013-07-16 LAB — CREATININE, SERUM: CREATININE: 0.83 mg/dL (ref 0.50–1.35)

## 2013-07-16 LAB — BUN: BUN: 15 mg/dL (ref 6–23)

## 2013-07-19 ENCOUNTER — Ambulatory Visit (INDEPENDENT_AMBULATORY_CARE_PROVIDER_SITE_OTHER): Payer: 59

## 2013-07-19 DIAGNOSIS — R221 Localized swelling, mass and lump, neck: Secondary | ICD-10-CM

## 2013-07-19 DIAGNOSIS — R220 Localized swelling, mass and lump, head: Secondary | ICD-10-CM

## 2013-07-19 MED ORDER — IOHEXOL 300 MG/ML  SOLN
75.0000 mL | Freq: Once | INTRAMUSCULAR | Status: AC | PRN
  Administered 2013-07-19: 75 mL via INTRAVENOUS

## 2013-07-20 ENCOUNTER — Other Ambulatory Visit: Payer: Self-pay | Admitting: Physician Assistant

## 2013-07-20 DIAGNOSIS — D490 Neoplasm of unspecified behavior of digestive system: Secondary | ICD-10-CM

## 2013-08-20 ENCOUNTER — Encounter (HOSPITAL_COMMUNITY): Payer: Self-pay | Admitting: Emergency Medicine

## 2013-08-20 ENCOUNTER — Observation Stay (HOSPITAL_COMMUNITY)
Admission: EM | Admit: 2013-08-20 | Discharge: 2013-08-22 | Disposition: A | Discharge status: Home / Self Care | Payer: 59 | Attending: Internal Medicine | Admitting: Internal Medicine

## 2013-08-20 DIAGNOSIS — I4891 Unspecified atrial fibrillation: Secondary | ICD-10-CM | POA: Insufficient documentation

## 2013-08-20 DIAGNOSIS — Z7982 Long term (current) use of aspirin: Secondary | ICD-10-CM | POA: Insufficient documentation

## 2013-08-20 DIAGNOSIS — E119 Type 2 diabetes mellitus without complications: Secondary | ICD-10-CM

## 2013-08-20 DIAGNOSIS — R3915 Urgency of urination: Secondary | ICD-10-CM

## 2013-08-20 DIAGNOSIS — S0990XA Unspecified injury of head, initial encounter: Secondary | ICD-10-CM

## 2013-08-20 DIAGNOSIS — Z87891 Personal history of nicotine dependence: Secondary | ICD-10-CM | POA: Insufficient documentation

## 2013-08-20 DIAGNOSIS — R079 Chest pain, unspecified: Principal | ICD-10-CM | POA: Insufficient documentation

## 2013-08-20 DIAGNOSIS — N529 Male erectile dysfunction, unspecified: Secondary | ICD-10-CM

## 2013-08-20 DIAGNOSIS — R220 Localized swelling, mass and lump, head: Secondary | ICD-10-CM | POA: Insufficient documentation

## 2013-08-20 DIAGNOSIS — R209 Unspecified disturbances of skin sensation: Secondary | ICD-10-CM

## 2013-08-20 DIAGNOSIS — K118 Other diseases of salivary glands: Secondary | ICD-10-CM

## 2013-08-20 DIAGNOSIS — Z8673 Personal history of transient ischemic attack (TIA), and cerebral infarction without residual deficits: Secondary | ICD-10-CM | POA: Insufficient documentation

## 2013-08-20 DIAGNOSIS — I639 Cerebral infarction, unspecified: Secondary | ICD-10-CM

## 2013-08-20 DIAGNOSIS — R221 Localized swelling, mass and lump, neck: Secondary | ICD-10-CM

## 2013-08-20 HISTORY — DX: Umbilical hernia without obstruction or gangrene: K42.9

## 2013-08-20 NOTE — ED Notes (Signed)
Pt presents with Left side chest pain radiating to Left shoulder/arm, headache, dizziness, and indigestion. Pt states the need to have a BM but was unable to, also states a lot of "gas" and "burping,."

## 2013-08-21 ENCOUNTER — Encounter (HOSPITAL_COMMUNITY): Payer: Self-pay | Admitting: Internal Medicine

## 2013-08-21 ENCOUNTER — Emergency Department (HOSPITAL_COMMUNITY): Payer: 59

## 2013-08-21 DIAGNOSIS — K118 Other diseases of salivary glands: Secondary | ICD-10-CM | POA: Diagnosis present

## 2013-08-21 DIAGNOSIS — I059 Rheumatic mitral valve disease, unspecified: Secondary | ICD-10-CM

## 2013-08-21 DIAGNOSIS — R079 Chest pain, unspecified: Secondary | ICD-10-CM

## 2013-08-21 DIAGNOSIS — I4891 Unspecified atrial fibrillation: Secondary | ICD-10-CM

## 2013-08-21 DIAGNOSIS — E119 Type 2 diabetes mellitus without complications: Secondary | ICD-10-CM

## 2013-08-21 LAB — BASIC METABOLIC PANEL
BUN: 18 mg/dL (ref 6–23)
BUN: 20 mg/dL (ref 6–23)
CALCIUM: 8.9 mg/dL (ref 8.4–10.5)
CO2: 25 mEq/L (ref 19–32)
CO2: 26 mEq/L (ref 19–32)
Calcium: 8.3 mg/dL — ABNORMAL LOW (ref 8.4–10.5)
Chloride: 104 mEq/L (ref 96–112)
Chloride: 108 mEq/L (ref 96–112)
Creatinine, Ser: 0.93 mg/dL (ref 0.50–1.35)
Creatinine, Ser: 0.96 mg/dL (ref 0.50–1.35)
GFR calc Af Amer: >90 mL/min (ref >90)
GFR calc non Af Amer: 87 mL/min — ABNORMAL LOW (ref >90)
GFR, EST NON AFRICAN AMERICAN: 88 mL/min — AB (ref >90)
GLUCOSE: 86 mg/dL (ref 70–99)
Glucose, Bld: 128 mg/dL — ABNORMAL HIGH (ref 70–99)
POTASSIUM: 3.6 meq/L — AB (ref 3.7–5.3)
POTASSIUM: 4.1 meq/L (ref 3.7–5.3)
SODIUM: 141 meq/L (ref 137–147)
Sodium: 142 mEq/L (ref 137–147)

## 2013-08-21 LAB — CBC
HCT: 40.1 % (ref 39.0–52.0)
HEMATOCRIT: 37.8 % — AB (ref 39.0–52.0)
HEMOGLOBIN: 12.8 g/dL — AB (ref 13.0–17.0)
Hemoglobin: 13.8 g/dL (ref 13.0–17.0)
MCH: 32.5 pg (ref 26.0–34.0)
MCH: 32.2 pg (ref 26.0–34.0)
MCHC: 33.9 g/dL (ref 30.0–36.0)
MCHC: 34.4 g/dL (ref 30.0–36.0)
MCV: 94.6 fL (ref 78.0–100.0)
MCV: 95 fL (ref 78.0–100.0)
Platelets: 221 10*3/uL (ref 150–400)
Platelets: 198 10*3/uL (ref 150–400)
RBC: 4.24 MIL/uL (ref 4.22–5.81)
RBC: 3.98 MIL/uL — AB (ref 4.22–5.81)
RDW: 13.6 % (ref 11.5–15.5)
RDW: 13.6 % (ref 11.5–15.5)
WBC: 5.1 10*3/uL (ref 4.0–10.5)
WBC: 4.1 10*3/uL (ref 4.0–10.5)

## 2013-08-21 LAB — TROPONIN I
Troponin I: <0.3 ng/mL (ref <0.30)
Troponin I: <0.3 ng/mL (ref <0.30)

## 2013-08-21 LAB — GLUCOSE, CAPILLARY
GLUCOSE-CAPILLARY: 81 mg/dL (ref 70–99)
Glucose-Capillary: 87 mg/dL (ref 70–99)
Glucose-Capillary: 94 mg/dL (ref 70–99)
Glucose-Capillary: 95 mg/dL (ref 70–99)

## 2013-08-21 LAB — I-STAT TROPONIN, ED: Troponin i, poc: 0.3 ng/mL (ref 0.00–0.08)

## 2013-08-21 LAB — TSH: TSH: 1.39 u[IU]/mL (ref 0.350–4.500)

## 2013-08-21 LAB — LIPID PANEL
CHOL/HDL RATIO: 2.7 ratio
CHOLESTEROL: 122 mg/dL (ref 0–200)
HDL: 45 mg/dL (ref >39)
LDL Cholesterol: 67 mg/dL (ref 0–99)
TRIGLYCERIDES: 48 mg/dL (ref <150)
VLDL: 10 mg/dL (ref 0–40)

## 2013-08-21 LAB — HEMOGLOBIN A1C
Hgb A1c MFr Bld: 5.1 % (ref <5.7)
MEAN PLASMA GLUCOSE: 100 mg/dL (ref <117)

## 2013-08-21 LAB — D-DIMER, QUANTITATIVE (NOT AT ARMC): D DIMER QUANT: 0.27 ug{FEU}/mL (ref 0.00–0.48)

## 2013-08-21 MED ORDER — NITROGLYCERIN 0.4 MG SL SUBL
SUBLINGUAL_TABLET | SUBLINGUAL | Status: AC
  Filled 2013-08-21: qty 1

## 2013-08-21 MED ORDER — ONDANSETRON HCL 4 MG PO TABS: 4.0000 mg | ORAL_TABLET | Freq: Four times a day (QID) | ORAL | Status: DC | PRN

## 2013-08-21 MED ORDER — PIOGLITAZONE HCL 45 MG PO TABS
45.0000 mg | ORAL_TABLET | Freq: Every day | ORAL | Status: DC
  Filled 2013-08-21 (×2): qty 1

## 2013-08-21 MED ORDER — FENTANYL CITRATE 0.05 MG/ML IJ SOLN
INTRAMUSCULAR | Status: AC
  Filled 2013-08-21: qty 2

## 2013-08-21 MED ORDER — SODIUM CHLORIDE 0.9 % IV SOLN: INTRAVENOUS | Status: DC

## 2013-08-21 MED ORDER — FENTANYL CITRATE 0.05 MG/ML IJ SOLN
50.0000 ug | Freq: Once | INTRAMUSCULAR | Status: AC
  Administered 2013-08-21: 50 ug via INTRAVENOUS

## 2013-08-21 MED ORDER — FLUTICASONE PROPIONATE 50 MCG/ACT NA SUSP
2.0000 | Freq: Every day | NASAL | Status: DC
  Administered 2013-08-21 – 2013-08-22 (×2): 2 via NASAL
  Filled 2013-08-21: qty 16

## 2013-08-21 MED ORDER — INSULIN ASPART 100 UNIT/ML ~~LOC~~ SOLN: 0.0000 [IU] | Freq: Every day | SUBCUTANEOUS | Status: DC

## 2013-08-21 MED ORDER — MORPHINE SULFATE 4 MG/ML IJ SOLN: 4.0000 mg | Freq: Once | INTRAMUSCULAR | Status: DC

## 2013-08-21 MED ORDER — HYDROMORPHONE HCL PF 1 MG/ML IJ SOLN: 0.5000 mg | INTRAMUSCULAR | Status: DC | PRN

## 2013-08-21 MED ORDER — ACETAMINOPHEN 650 MG RE SUPP: 650.0000 mg | Freq: Four times a day (QID) | RECTAL | Status: DC | PRN

## 2013-08-21 MED ORDER — INSULIN ASPART 100 UNIT/ML ~~LOC~~ SOLN: 0.0000 [IU] | Freq: Three times a day (TID) | SUBCUTANEOUS | Status: DC

## 2013-08-21 MED ORDER — NITROGLYCERIN 0.4 MG SL SUBL: 0.4000 mg | SUBLINGUAL_TABLET | SUBLINGUAL | Status: DC | PRN

## 2013-08-21 MED ORDER — ONDANSETRON HCL 4 MG/2ML IJ SOLN: 4.0000 mg | Freq: Four times a day (QID) | INTRAMUSCULAR | Status: DC | PRN

## 2013-08-21 MED ORDER — ACETAMINOPHEN 325 MG PO TABS: 650.0000 mg | ORAL_TABLET | Freq: Four times a day (QID) | ORAL | Status: DC | PRN

## 2013-08-21 MED ORDER — ENOXAPARIN SODIUM 40 MG/0.4ML ~~LOC~~ SOLN
40.0000 mg | SUBCUTANEOUS | Status: DC
  Administered 2013-08-21: 40 mg via SUBCUTANEOUS
  Filled 2013-08-21 (×2): qty 0.4

## 2013-08-21 MED ORDER — ALUM & MAG HYDROXIDE-SIMETH 200-200-20 MG/5ML PO SUSP: 30.0000 mL | Freq: Four times a day (QID) | ORAL | Status: DC | PRN

## 2013-08-21 MED ORDER — OXYCODONE HCL 5 MG PO TABS: 5.0000 mg | ORAL_TABLET | ORAL | Status: DC | PRN

## 2013-08-21 MED ORDER — SODIUM CHLORIDE 0.9 % IJ SOLN
3.0000 mL | Freq: Two times a day (BID) | INTRAMUSCULAR | Status: DC
  Administered 2013-08-21 (×2): 3 mL via INTRAVENOUS

## 2013-08-21 MED ORDER — ASPIRIN 325 MG PO TABS
325.0000 mg | ORAL_TABLET | Freq: Every day | ORAL | Status: DC
  Administered 2013-08-21 – 2013-08-22 (×2): 325 mg via ORAL
  Filled 2013-08-21 (×2): qty 1

## 2013-08-21 MED ORDER — ASPIRIN 81 MG PO CHEW
324.0000 mg | CHEWABLE_TABLET | Freq: Once | ORAL | Status: AC
  Administered 2013-08-21: 324 mg via ORAL
  Filled 2013-08-21: qty 4

## 2013-08-21 NOTE — ED Notes (Signed)
Dr Kathrynn Humble given a copy of troponin results .[place]

## 2013-08-21 NOTE — ED Notes (Signed)
2nd EKG given to nanavati, pt c/o of chest tightness.

## 2013-08-21 NOTE — Progress Notes (Signed)
Utilization Review Completed.  

## 2013-08-21 NOTE — ED Notes (Signed)
Jenkins, MD at bedside.  

## 2013-08-21 NOTE — Progress Notes (Signed)
  Echocardiogram 2D Echocardiogram has been performed.  [place] 08/21/2013, 11:25 AM

## 2013-08-21 NOTE — ED Provider Notes (Addendum)
CSN: [id number]     Arrival date & time 08/20/13  2346 History   First MD Initiated Contact with Patient 08/21/13 0033     Chief Complaint  Patient presents with  . Chest Pain     (Consider location/radiation/quality/duration/timing/severity/associated sxs/prior Treatment) HPI Comments: Pt comes in with cc of chest pain. Sudden onset, started around 9:30 - pressure like. "like i got hit with base ball bat." The pain was radiating to the left side. No aggravating or relieving factors, no hx of similar pain, + nausea, dizzy, dib, negative diaphoresis. Pain is intermittent.  Hx. Of stroke. No other medical hx. No current deficits. No cardiac evaluation.  Initial EKG shoes pt in afib with RVR. No hx of same in the past. Pt did feel like he had palpitations.  Patient is a 63 y.o. male presenting with chest pain. The history is provided by the patient.  Chest Pain Associated symptoms: no abdominal pain, no cough and no shortness of breath     Past Medical History  Diagnosis Date  . Stroke 10/11  . Hernia, umbilical    Past Surgical History  Procedure Laterality Date  . Cataracts    . Hemorroidectomy    . C6-7 fusion  10/2005  . Neck fusion     Family History  Problem Relation Age of Onset  . Alcohol abuse Mother   . Colon cancer Father   . Alcohol abuse Father   . Stroke Maternal Grandfather    History  Substance Use Topics  . Smoking status: Former Smoker    Types: Cigarettes    Quit date: 03/25/1999  . Smokeless tobacco: Not on file  . Alcohol Use: No    Review of Systems  Constitutional: Negative for activity change and appetite change.  Respiratory: Negative for cough and shortness of breath.   Cardiovascular: Positive for chest pain.  Gastrointestinal: Negative for abdominal pain.  Genitourinary: Negative for dysuria.  All other systems reviewed and are negative.     Allergies  Piroxicam  Home Medications   Prior to Admission medications   Medication  Sig Start Date End Date Taking? Authorizing Provider  aspirin 325 MG tablet Take 325 mg by mouth daily.   Yes Historical Provider, MD  fluticasone (FLONASE) 50 MCG/ACT nasal spray Place 2 sprays into the nose daily. 07/28/12 08/21/13 Yes Jade L Breeback, PA-C  meloxicam (MOBIC) 15 MG tablet Take 15 mg by mouth daily as needed for pain.   Yes Historical Provider, MD  pioglitazone (ACTOS) 45 MG tablet Take 45 mg by mouth daily.   Yes Historical Provider, MD   BP 102/69  Pulse 58  Temp(Src) 97.9 F (36.6 C) (Oral)  Resp 10  Ht 5\' 10"  (1.778 m)  Wt 212 lb (96.163 kg)  BMI 30.42 kg/m2  SpO2 94% Physical Exam  Nursing note and vitals reviewed. Constitutional: He is oriented to person, place, and time. He appears well-developed.  HENT:  Head: Normocephalic and atraumatic.  Eyes: Conjunctivae and EOM are normal. Pupils are equal, round, and reactive to light.  Neck: Normal range of motion. Neck supple.  Cardiovascular: Normal rate, regular rhythm and intact distal pulses.   No murmur heard. Pulmonary/Chest: Effort normal and breath sounds normal.  Abdominal: Soft. Bowel sounds are normal. He exhibits no distension. There is no tenderness. There is no rebound and no guarding.  Musculoskeletal: He exhibits no edema.  Neurological: He is alert and oriented to person, place, and time.  Skin: Skin is warm.  ED Course  Procedures (including critical care time) Labs Review Labs Reviewed  BASIC METABOLIC PANEL - Abnormal; Notable for the following:    Potassium 3.6 (*)    Glucose, Bld 128 (*)    GFR calc non Af Amer 88 (*)    All other components within normal limits  I-STAT TROPOININ, ED - Abnormal; Notable for the following:    Troponin i, poc 0.30 (*)    All other components within normal limits  CBC  TROPONIN I    Imaging Review Dg Chest Port 1 View  08/21/2013   CLINICAL DATA:  Chest pain  EXAM: PORTABLE CHEST - 1 VIEW  COMPARISON:  08/03/2007  FINDINGS: Borderline cardiomegaly,  with size accentuated by technique. Unremarkable upper mediastinal contours and hila. Mild interstitial crowding at the bases. No consolidation, edema, effusion, or pneumothorax. Lower cervical to upper thoracic discectomy.  IMPRESSION: No active disease.   Electronically Signed   By: Jorje Guild M.D.   On: 08/21/2013 02:43     EKG Interpretation   Date/Time:  Sunday Aug 21 2013 01:32:20 EDT Ventricular Rate:  68 PR Interval:  157 QRS Duration: 87 QT Interval:  381 QTC Calculation: 405 R Axis:   60 Text Interpretation:  Sinus rhythm Low voltage, precordial leads Confirmed  by Kathrynn Humble, MD, Thelma Comp [phone]) on 08/21/2013 2:45:16 AM       Date: 08/21/2013  Rate: 165  Rhythm: atrial fibrillation  QRS Axis: normal  Intervals: normal  ST/T Wave abnormalities: nonspecific ST/T changes  Conduction Disutrbances:none  Narrative Interpretation:   Old EKG Reviewed: changes noted    Date: 08/21/2013  Rate: 61  Rhythm: normal sinus rhythm  QRS Axis: normal  Intervals: normal  ST/T Wave abnormalities: normal  Conduction Disutrbances: none  Narrative Interpretation: unremarkable      MDM   Final diagnoses:  Atrial fibrillation with RVR  Chest pain    Pt comes in with intermittent chest pain and paliptations with some associated dizziness, nausea. Had afib with RVR at arrival  -but upon my eval, he was in sinus rhythm, and chest pain free. No hx of afib - CHADs2 score is 2 - Lovenox given in the ER.  Chest pain - atypical. HEART score is 2 - for age and moderately suspicion hx. Troponin is neg (i stat was + - but could be due to afib). ASA given.      Varney Biles, MD 08/21/13 1941  Varney Biles, MD 08/21/13 [phone]

## 2013-08-21 NOTE — Progress Notes (Signed)
Patient admitted after midnight. Chart reviewed. Patient examined.  Patient had atrial fibrillation with rapid ventricular response unreliable in the emergency room, but resolved by the time the ED physician evaluated the patient. This is not mentioned in the H&P. Patient had no previous palpitations but had been feeling intermittently dizzy, which he attributed to working in the heat and humidity. Had a cardiac catheterization and stress test over 10 years ago.  Patient also noted some leg swelling the other day which has resolved. TSH not ordered. Will order. Also get d-dimer and if positive consider CT angiogram of the chest to rule out PE. Echo pending.  Doree Barthel, MD [facility]

## 2013-08-21 NOTE — H&P (Signed)
[facility] History and Physical  Stephen King WJX:[id number] DOB: [birth year] DOA: 08/20/2013  Referring physician:  PCP: Iran Planas, PA-C  Specialists:   Chief Complaint:  Chest Pain  HPI: Stephen King is a 63 y.o. male with a hx of DM2, and a CVA in 2011 who presents to the ED with complaints of substernal Chest Pain  Radiating into the back and left shoulder lasting x 1.5 hours associated with SOB, Lightheadedness and Nausea with Indigestion symptoms.   He rated the pain at a 10/10 at the worst.  He also had palpitations.   He was evaluated in the ED and the initial troponin was negative so he was referred for a cardiac workup.       Review of Systems:  Constitutional: No Weight Loss, No Weight Gain, Night Sweats, Fevers, Chills, Fatigue, +Lightheadedness, No Generalized Weakness HEENT: No Headaches, Difficulty Swallowing,Tooth/Dental Problems,Sore Throat,  No Sneezing, Rhinitis, Ear Ache, Nasal Congestion, or Post Nasal Drip,  Cardio-vascular:  +Chest pain, Orthopnea, PND, Edema in lower extremities, Anasarca, Dizziness, Palpitations  Resp: +Dyspnea, No DOE, No Cough, No Hemoptysis, No Wheezing.    GI: No Heartburn, Indigestion, Abdominal Pain, +Nausea, +Indigestion, Vomiting, Diarrhea, Change in Bowel Habits,  Loss of Appetite  GU: No Dysuria, Change in Color of Urine, No Urgency or Frequency.  No flank pain.  Musculoskeletal: No Joint Pain or Swelling.  No Decreased Range of Motion. No Back Pain.  Neurologic: No Syncope, No Seizures, Muscle Weakness, Paresthesia, Vision Disturbance or Loss, No Diplopia, No Vertigo, No Difficulty Walking,  Skin: No Rash or Lesions. Psych: No Change in Mood or Affect. No Depression or Anxiety. No Memory loss. No Confusion or Hallucinations   Past Medical History  Diagnosis Date  . Stroke 10/11  . Hernia, umbilical         DM2    Past Surgical History  Procedure Laterality Date  . Cataracts    . Hemorroidectomy    . C6-7 fusion   10/2005  . Neck fusion       Prior to Admission medications   Medication Sig Start Date End Date Taking? Authorizing Provider  aspirin 325 MG tablet Take 325 mg by mouth daily.   Yes Historical Provider, MD  fluticasone (FLONASE) 50 MCG/ACT nasal spray Place 2 sprays into the nose daily. 07/28/12 08/21/13 Yes Jade L Breeback, PA-C  meloxicam (MOBIC) 15 MG tablet Take 15 mg by mouth daily as needed for pain.   Yes Historical Provider, MD  pioglitazone (ACTOS) 45 MG tablet Take 45 mg by mouth daily.   Yes Historical Provider, MD     Allergies  Allergen Reactions  . Piroxicam     Social History:  reports that he quit smoking about 14 years ago. His smoking use included Cigarettes. He smoked 0.00 packs per day. He does not have any smokeless tobacco history on file. He reports that he does not drink alcohol or use illicit drugs.     Family History  Problem Relation Age of Onset  . Alcohol abuse Mother   . Colon cancer Father   . Alcohol abuse Father   . Stroke Maternal Grandfather        Physical Exam:  GEN:  Pleasant Obese 63 y.o. Caucasian male examined  and in no acute distress; cooperative with exam Filed Vitals:   08/21/13 0100 08/21/13 0130 08/21/13 0246 08/21/13 0300  BP: 100/69 95/63 98/66  102/69  Pulse: 75 62 57 58  Temp:      TempSrc:  Resp: 11 15  10   Height:      Weight:      SpO2: 98% 93%  94%   Blood pressure 102/69, pulse 58, temperature 97.9 F (36.6 C), temperature source Oral, resp. rate 10, height 5\' 10"  (1.778 m), weight 96.163 kg (212 lb), SpO2 94.00%. PSYCH: He is alert and oriented x4; does not appear anxious does not appear depressed; affect is normal HEENT: Normocephalic and Atraumatic, Mucous membranes pink; PERRLA; EOM intact; Fundi:  Benign;  No scleral icterus, Nares: Patent, Oropharynx: Clear,  Fair Dentition, Neck:  FROM, no cervical lymphadenopathy nor thyromegaly or carotid bruit; no JVD; Breasts:: Not examined CHEST WALL: No  tenderness CHEST: Normal respiration, clear to auscultation bilaterally HEART: Regular rate and rhythm; no murmurs rubs or gallops BACK: No kyphosis or scoliosis; no CVA tenderness ABDOMEN: Positive Bowel Sounds, Obese, soft non-tender; no masses, no organomegaly. Rectal Exam: Not done EXTREMITIES: No cyanosis, clubbing or edema; no ulcerations. Genitalia: not examined PULSES: 2+ and symmetric SKIN: Normal hydration no rash or ulceration CNS:  Alert and Oriented X 4, No focal Deficits Vascular: pulses palpable throughout    Labs on Admission:  Basic Metabolic Panel:  Recent Labs Lab 08/21/13 0045  NA 141  K 3.6*  CL 104  CO2 25  GLUCOSE 128*  BUN 20  CREATININE 0.93  CALCIUM 8.9   Liver Function Tests: No results found for this basename: AST, ALT, ALKPHOS, BILITOT, PROT, ALBUMIN,  in the last 168 hours No results found for this basename: LIPASE, AMYLASE,  in the last 168 hours No results found for this basename: AMMONIA,  in the last 168 hours CBC:  Recent Labs Lab 08/21/13 0045  WBC 5.1  HGB 13.8  HCT 40.1  MCV 94.6  PLT 221   Cardiac Enzymes:  Recent Labs Lab 08/21/13 0133  TROPONINI <0.30    BNP (last 3 results) No results found for this basename: PROBNP,  in the last 8760 hours CBG: No results found for this basename: GLUCAP,  in the last 168 hours  Radiological Exams on Admission: Dg Chest Port 1 View  08/21/2013   CLINICAL DATA:  Chest pain  EXAM: PORTABLE CHEST - 1 VIEW  COMPARISON:  08/03/2007  FINDINGS: Borderline cardiomegaly, with size accentuated by technique. Unremarkable upper mediastinal contours and hila. Mild interstitial crowding at the bases. No consolidation, edema, effusion, or pneumothorax. Lower cervical to upper thoracic discectomy.  IMPRESSION: No active disease.   Electronically Signed   By: Jorje Guild M.D.   On: 08/21/2013 02:43      EKG: Independently reviewed.  Normal Sinus Rhythm No Acute S-T  changes    Assessment/Plan:   63 y.o. male with  Active Problems:   Chest pain   Type II diabetes mellitus   Palpitations   1.    Chest Pain-  Telemetry Monitoring,  Cycle Troponins,  ASA Rx.  2D ECHO in AM, and Check Fasting Lipids.     2.    Type II DM-  Hold Actos for now, SSI coverage PRn, and check HbA1C level.    3.    Palpitations-   Monitor on Telemetry,   Check TSH level.     4.    DVT Prophylaxis with Lovenox.        Code Status:   FULL CODE    Family Communication:   Wife at bedside  Disposition Plan:   Observation     Time spent:  [place]  [facility] Pager [phone]  If 7PM-7AM, please contact night-coverage [email or web address] Password [facility] 08/21/2013, 3:34 AM

## 2013-08-22 ENCOUNTER — Encounter (HOSPITAL_COMMUNITY): Payer: Self-pay | Admitting: Internal Medicine

## 2013-08-22 DIAGNOSIS — I6350 Cerebral infarction due to unspecified occlusion or stenosis of unspecified cerebral artery: Secondary | ICD-10-CM

## 2013-08-22 LAB — GLUCOSE, CAPILLARY
GLUCOSE-CAPILLARY: 78 mg/dL (ref 70–99)
Glucose-Capillary: 101 mg/dL — ABNORMAL HIGH (ref 70–99)

## 2013-08-22 MED ORDER — ACETAMINOPHEN 325 MG PO TABS: 650.0000 mg | ORAL_TABLET | Freq: Four times a day (QID) | ORAL | Status: DC | PRN

## 2013-08-22 MED ORDER — METOPROLOL TARTRATE 12.5 MG HALF TABLET: 12.5000 mg | ORAL_TABLET | Freq: Two times a day (BID) | ORAL | Status: DC

## 2013-08-22 MED ORDER — RIVAROXABAN 20 MG PO TABS: 20.0000 mg | ORAL_TABLET | Freq: Every day | ORAL | Status: DC

## 2013-08-22 NOTE — Progress Notes (Signed)
IV and tele monitor d/c at this time; pt to d/c home with wife; pt given d/c instructions; pt and wife verbalized understanding of d/c instructions; pt stated he did not need wheelchair upon d/c; pt prefers to ambulate out with wife.

## 2013-08-22 NOTE — Discharge Summary (Signed)
Physician Discharge Summary  Stephen King [id number]:[id number] DOB: 08/11/1950 DOA: 08/20/2013  PCP: Iran Planas, PA-C  Admit date: 08/20/2013 Discharge date: 08/22/2013   Discharge Diagnoses:  Principal Problem:   Chest pain Active Problems:   Atrial fibrillation with RVR   Parotid mass, RIGHT   Discharge Condition: stable  Filed Weights   08/20/13 2355 08/21/13 0417  Weight: 96.163 kg (212 lb) 98.975 kg (218 lb 3.2 oz)    History of present illness:  63 y.o. male with a hx of DM2, and a CVA in 2011 who presents to the ED with complaints of substernal Chest Pain Radiating into the back and left shoulder lasting x 1.5 hours associated with SOB, Lightheadedness and Nausea with Indigestion symptoms. He rated the pain at a 10/10 at the worst. He also had palpitations. In ED, had atrial fibrillation with rapid ventricular response.  He was evaluated in the ED and the initial troponin was negative so he was referred for a cardiac workup.   Hospital Course:  Observed on telemetry. No further atrial fibrillation, MI ruled out. Echo showed normal EF and no wall motion abnormalities.  Discussed with Dr. Burt Knack, cardiology.  CHADS score 2. Given h/o CVA, pt placed on rivaroxiban. will  Follow up with [facility] in 3 weeks.    Also, has parotid mass, and has ENT visit scheduled in the near future  Patient does NOT have diabetes, despite PMH listed in Epic.  Procedures:  none  Consultations:  none  Discharge Exam: Filed Vitals:   08/22/13 0417  BP: 106/70  Pulse: 58  Temp: 97.8 F (36.6 C)  Resp: 17    General: comfortable Cardiovascular: RRR Respiratory: CTA  Discharge Instructions You were cared for by a hospitalist during your hospital stay. If you have any questions about your discharge medications or the care you received while you were in the hospital after you are discharged, you can call the unit and asked to speak with the hospitalist on call if the hospitalist that  took care of you is not available. Once you are discharged, your primary care physician will handle any further medical issues. Please note that NO REFILLS for any discharge medications will be authorized once you are discharged, as it is imperative that you return to your primary care physician (or establish a relationship with a primary care physician if you do not have one) for your aftercare needs so that they can reassess your need for medications and monitor your lab values.  Discharge Instructions   Diet general    Complete by:  As directed             Medication List    STOP taking these medications       aspirin 325 MG tablet     meloxicam 15 MG tablet  Commonly known as:  MOBIC      TAKE these medications       acetaminophen 325 MG tablet  Commonly known as:  TYLENOL  Take 2 tablets (650 mg total) by mouth every 6 (six) hours as needed for mild pain (or Fever >/= 101).     fluticasone 50 MCG/ACT nasal spray  Commonly known as:  FLONASE  Place 2 sprays into the nose daily.     metoprolol tartrate 12.5 mg Tabs tablet  Commonly known as:  LOPRESSOR  Take 0.5 tablets (12.5 mg total) by mouth 2 (two) times daily.     pioglitazone 45 MG tablet  Commonly known as:  ACTOS  Take 45 mg by mouth daily.     rivaroxaban 20 MG Tabs tablet  Commonly known as:  XARELTO  Take 1 tablet (20 mg total) by mouth daily with supper.       Allergies  Allergen Reactions  . Piroxicam        Follow-up Information   Follow up with [facility] In 3 weeks.   Contact information:   [place] [address] [phone]      Follow up with [facility], JADE, PA-C. (As needed)    Specialty:  Family Medicine   Contact information:   [phone] [address] [place] [address] [phone]        The results of significant diagnostics from this hospitalization (including imaging, microbiology,  ancillary and laboratory) are listed below for reference.    Significant Diagnostic Studies: Dg Chest Port 1 View  08/21/2013   CLINICAL DATA:  Chest pain  EXAM: PORTABLE CHEST - 1 VIEW  COMPARISON:  08/03/2007  FINDINGS: Borderline cardiomegaly, with size accentuated by technique. Unremarkable upper mediastinal contours and hila. Mild interstitial crowding at the bases. No consolidation, edema, effusion, or pneumothorax. Lower cervical to upper thoracic discectomy.  IMPRESSION: No active disease.   Electronically Signed   By: Jorje Guild M.D.   On: 08/21/2013 02:43    Microbiology: No results found for this or any previous visit (from the past 240 hour(s)).   Labs: Basic Metabolic Panel:  Recent Labs Lab 08/21/13 0045 08/21/13 0531  NA 141 142  K 3.6* 4.1  CL 104 108  CO2 25 26  GLUCOSE 128* 86  BUN 20 18  CREATININE 0.93 0.96  CALCIUM 8.9 8.3*   Liver Function Tests: No results found for this basename: AST, ALT, ALKPHOS, BILITOT, PROT, ALBUMIN,  in the last 168 hours No results found for this basename: LIPASE, AMYLASE,  in the last 168 hours No results found for this basename: AMMONIA,  in the last 168 hours CBC:  Recent Labs Lab 08/21/13 0045 08/21/13 0531  WBC 5.1 4.1  HGB 13.8 12.8*  HCT 40.1 37.8*  MCV 94.6 95.0  PLT 221 198   Cardiac Enzymes:  Recent Labs Lab 08/21/13 0133 08/21/13 0533 08/21/13 0950  TROPONINI <0.30 <0.30 <0.30   BNP: BNP (last 3 results) No results found for this basename: PROBNP,  in the last 8760 hours CBG:  Recent Labs Lab 08/21/13 0553 08/21/13 1128 08/21/13 1626 08/21/13 2137 08/22/13 0619  GLUCAP 87 81 94 95 78       Signed:  [place]  [facility] 08/22/2013, 12:35 PM

## 2013-08-22 NOTE — Progress Notes (Signed)
Pt up ambulating in hallway with wife at this time; no needs voiced; will cont. To monitor. 

## 2013-08-23 ENCOUNTER — Encounter: Payer: Self-pay | Admitting: Physician Assistant

## 2013-08-26 ENCOUNTER — Encounter: Payer: Self-pay | Admitting: Physician Assistant

## 2013-08-26 ENCOUNTER — Ambulatory Visit (INDEPENDENT_AMBULATORY_CARE_PROVIDER_SITE_OTHER): Payer: 59 | Admitting: Physician Assistant

## 2013-08-26 VITALS — BP 104/69 | HR 57 | Ht 70.0 in | Wt 216.0 lb

## 2013-08-26 DIAGNOSIS — R2241 Localized swelling, mass and lump, right lower limb: Secondary | ICD-10-CM

## 2013-08-26 DIAGNOSIS — Z13220 Encounter for screening for lipoid disorders: Secondary | ICD-10-CM

## 2013-08-26 DIAGNOSIS — R229 Localized swelling, mass and lump, unspecified: Secondary | ICD-10-CM

## 2013-08-26 DIAGNOSIS — I4891 Unspecified atrial fibrillation: Secondary | ICD-10-CM

## 2013-08-26 DIAGNOSIS — D239 Other benign neoplasm of skin, unspecified: Secondary | ICD-10-CM

## 2013-08-26 DIAGNOSIS — Z0000 Encounter for general adult medical examination without abnormal findings: Secondary | ICD-10-CM

## 2013-08-26 DIAGNOSIS — D229 Melanocytic nevi, unspecified: Secondary | ICD-10-CM

## 2013-08-26 NOTE — Progress Notes (Signed)
Subjective:    Patient ID: Stephen King, male    DOB: 1950/11/09, 62 y.o.   MRN: [id number]  HPI Patient is a 63 year old male who presents to the clinic for complete physical today.  He was recently in the hospital for atrial fibrillation. Was diagnosed by EKG upon arrival of cardio converted on his own before cardiologist arrived. He was placed on metoprolol and Xaralto. He is doing well on these medications and has a followup with cardiologist, Dr. Marthe Patch, at the end of June.  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   Patient did notice this morning a knot on his right lower calf that was tender to touch. He denies any known injury or trauma. He has not tried anything to make better and nothing seems to make worse. There is no pain with ambulation.  Review of Systems  All other systems reviewed and are negative.      Objective:   Physical Exam BP 104/69  Pulse 57  Ht 5\' 10"  (1.778 m)  Wt 216 lb (97.977 kg)  BMI 30.99 kg/m2  General Appearance:    Alert, cooperative, no distress, appears stated age  Head:    Normocephalic, without obvious abnormality, atraumatic  Eyes:    PERRL, conjunctiva/corneas clear, EOM's intact, fundi    benign, both eyes       Ears:    Normal TM's and external ear canals, both ears  Nose:   Nares normal, septum midline, mucosa normal, no drainage    or sinus tenderness  Throat:   Lips, mucosa, and tongue normal; teeth and gums normal  Neck:   Supple, symmetrical, trachea midline, no adenopathy;       thyroid:  No enlargement/tenderness/nodules; no carotid   bruit or JVD  Back:     Symmetric, no curvature, ROM normal, no CVA tenderness  Lungs:     Clear to auscultation bilaterally, respirations unlabored  Chest wall:    No tenderness or deformity  Heart:    Regular rate and rhythm, S1 and S2 normal, no murmur, rub   or gallop  Abdomen:     Soft, non-tender, bowel sounds active all four quadrants,    no  masses, no organomegaly  Genitalia:    Normal male without lesion, discharge or tenderness  Rectal:    Normal tone, normal prostate, no masses or tenderness;   guaiac negative stool  Extremities:   Left extremity normal. Right lower anterior medial calf slightly bruised with 3cm by 3 cm area of rasied edema. No warmth or redness. Tender to touch. Negative homans.   Pulses:   2+ and symmetric all extremities  Skin:   Skin color, texture, turgor normal. 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   Lymph nodes:   Cervical, supraclavicular, and axillary nodes normal  Neurologic:   CNII-XII intact. Normal strength, sensation and reflexes      throughout         Assessment & Plan:  CPE-vaccines UPT except Zostavax. Discussed need will consider and follow up. Will get fasting labs today. Discussed regular exercise and healthy diet.   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 I released him with warning of bleeding. Pt aware of risk.   Afib- follow up with cardiologist. Discussed Jennye Moccasin and Metoprolol. Pt in sync today.   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 Will send to dermatology.  Mass of right leg- suscpion low for DVT espcially since xaralto however, need to rule out. Ordered stat venous ultrasound for today. Discussed elevation of foot and warm compresses. With pt's recent hx and start of xaralto need to be cautious.

## 2013-08-26 NOTE — Telephone Encounter (Signed)
Please let pt know that [facility] referral was placed. Can we call and see why appt not made. Give pt number to call [facility] himself.

## 2013-08-29 DIAGNOSIS — D229 Melanocytic nevi, unspecified: Secondary | ICD-10-CM | POA: Insufficient documentation

## 2013-08-29 DIAGNOSIS — I480 Paroxysmal atrial fibrillation: Secondary | ICD-10-CM | POA: Insufficient documentation

## 2013-08-29 NOTE — Telephone Encounter (Signed)
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

## 2013-09-15 ENCOUNTER — Other Ambulatory Visit: Payer: Self-pay | Admitting: *Deleted

## 2013-09-15 ENCOUNTER — Other Ambulatory Visit: Payer: Self-pay | Admitting: Physician Assistant

## 2013-09-15 MED ORDER — METOPROLOL TARTRATE 12.5 MG HALF TABLET: 12.5000 mg | ORAL_TABLET | Freq: Two times a day (BID) | ORAL | Status: DC

## 2013-09-30 ENCOUNTER — Emergency Department
Admission: EM | Admit: 2013-09-30 | Discharge: 2013-09-30 | Disposition: A | Discharge status: Home / Self Care | Payer: 59 | Source: Home / Self Care | Attending: Family Medicine | Admitting: Family Medicine

## 2013-09-30 ENCOUNTER — Emergency Department: Payer: 59

## 2013-09-30 ENCOUNTER — Encounter: Payer: Self-pay | Admitting: Emergency Medicine

## 2013-09-30 ENCOUNTER — Emergency Department (INDEPENDENT_AMBULATORY_CARE_PROVIDER_SITE_OTHER): Payer: 59

## 2013-09-30 DIAGNOSIS — M25469 Effusion, unspecified knee: Secondary | ICD-10-CM

## 2013-09-30 DIAGNOSIS — M25561 Pain in right knee: Secondary | ICD-10-CM

## 2013-09-30 DIAGNOSIS — M25569 Pain in unspecified knee: Secondary | ICD-10-CM

## 2013-09-30 NOTE — Discharge Instructions (Signed)
Wear knee brace daytime.  May take Tylenol for pain.   Patellofemoral Pain Your exam shows your knee pain is probably due to a problem with the knee cap, the patella. This problem is also called patellofemoral pain, runner's knee, or chondromalacia. Most of the time, this problem is due to overuse of the knee joint. Repeated bending and straightening can irritate the underside of the knee cap. When this happens, activities such as running, walking, climbing, biking or jumping usually produce pain. Pain may also occur after prolonged sitting. Other patellofemoral symptoms can include joint stiffness, swelling, and a snapping or grinding sensation with movement. Rest and rehabilitation are usually successful in treating this problem. Surgery is rarely needed. Treatment includes correcting any mechanical factors that could hurt the normal working of the knee. This could be weak thigh muscles or foot problems. Avoid repetitive activities of the knee until the pain and other symptoms improve. Apply ice packs over the knee for 20 to 30 minutes every 2 to 4 hours to reduce pain and swelling. Only take over-the-counter or prescription medicines for pain, discomfort, or fever as directed by your caregiver. Knee braces or neoprene sleeves may help reduce irritation. Rehabilitation exercises to strengthen the quad muscle are often prescribed when your symptoms are better. Call your caregiver for a follow-up exam to evaluate your response to treatment. Document Released: 04/17/2004 Document Revised: 06/02/2011 Document Reviewed: 03/10/2005 [facility] Patient Information 2015 [place], [place]. This information is not intended to replace advice given to you by your health care provider. Make sure you discuss any questions you have with your health care provider.

## 2013-09-30 NOTE — ED Notes (Signed)
Slipped while portaging on canoe trip and landed on both knees about 6 days ago; both tender, but right knee "popping" sensation and excrutiating pain.

## 2013-09-30 NOTE — ED Provider Notes (Signed)
CSN: [id number]     Arrival date & time 09/30/13  1102 History   First MD Initiated Contact with Patient 09/30/13 1245     Chief Complaint  Patient presents with  . Knee Pain   (Consider location/radiation/quality/duration/timing/severity/associated sxs/prior Treatment) HPI Comments: Patient was on a [employer] trip recently.  Eight days ago while portaging his canoe, he slipped and landed on both knees.  He has had persistent pain in the right anterior knee, although swelling has decreased.  He complains of a grinding, "popping" sensation at the medial aspect of his right patella.  Patient is a 63 y.o. male presenting with knee pain. The history is provided by the patient and the spouse.  Knee Pain Location:  Knee Time since incident:  8 days Injury: yes   Mechanism of injury: fall   Fall:    Fall occurred:  Walking and tripped   Impact surface:  Orthoptist of impact:  Knees Knee location:  R knee Pain details:    Quality:  Aching   Radiates to:  Does not radiate   Severity:  Moderate   Onset quality:  Sudden   Duration:  8 days   Timing:  Constant   Progression:  Unchanged Chronicity:  New Prior injury to area:  No Relieved by:  Nothing Worsened by:  Flexion Ineffective treatments:  Compression Associated symptoms: decreased ROM, stiffness and swelling   Associated symptoms: no back pain, no fever, no muscle weakness, no numbness and no tingling     Past Medical History  Diagnosis Date  . Stroke 10/11  . Hernia, umbilical   . Hypertension    Past Surgical History  Procedure Laterality Date  . Cataracts    . Hemorroidectomy    . C6-7 fusion  10/2005  . Neck fusion     Family History  Problem Relation Age of Onset  . Alcohol abuse Mother   . Colon cancer Father   . Alcohol abuse Father   . Stroke Maternal Grandfather    History  Substance Use Topics  . Smoking status: Former Smoker    Types: Cigarettes    Quit date: 03/25/1999  . Smokeless  tobacco: Not on file  . Alcohol Use: No    Review of Systems  Constitutional: Negative for fever.  Musculoskeletal: Positive for stiffness. Negative for back pain.  All other systems reviewed and are negative.   Allergies  Piroxicam  Home Medications   Prior to Admission medications   Medication Sig Start Date End Date Taking? Authorizing Provider  acetaminophen (TYLENOL) 325 MG tablet Take 2 tablets (650 mg total) by mouth every 6 (six) hours as needed for mild pain (or Fever >/= 101). 08/22/13   Delfina Redwood, MD  fluticasone (FLONASE) 50 MCG/ACT nasal spray Place 2 sprays into the nose daily. 07/28/12 08/21/13  Donella Stade, PA-C  metoprolol tartrate (LOPRESSOR) 25 MG tablet TAKE 1/2 TABLET BY MOUTH TWICE DAILY    Jade L Breeback, PA-C  pioglitazone (ACTOS) 45 MG tablet Take 45 mg by mouth daily.    Historical Provider, MD  rivaroxaban (XARELTO) 20 MG TABS tablet Take 1 tablet (20 mg total) by mouth daily with supper. 08/22/13   Delfina Redwood, MD   BP 104/70  Pulse 69  Temp(Src) 98.2 F (36.8 C) (Oral)  Resp 16  Ht 5\' 10"  (1.778 m)  Wt 209 lb (94.802 kg)  BMI 29.99 kg/m2  SpO2 95% Physical Exam  Nursing note and vitals  reviewed. Constitutional: He is oriented to person, place, and time. He appears well-developed and well-nourished. No distress.  HENT:  Head: Atraumatic.  Eyes: Conjunctivae are normal. Pupils are equal, round, and reactive to light.  Musculoskeletal:       Right knee: He exhibits bony tenderness. He exhibits normal range of motion, no swelling, no effusion, no ecchymosis, no deformity, no laceration, no erythema, normal alignment, no LCL laxity, normal patellar mobility, normal meniscus and no MCL laxity. Tenderness found. Medial joint line tenderness noted. No lateral joint line, no MCL, no LCL and no patellar tendon tenderness noted.       Legs: Right knee has tenderness to palpation along medial edge of patella extending to medial joint line.  No  pain with valgus/varus stress.  Knee stable.  Negative McMurray test.  Neurological: He is alert and oriented to person, place, and time.  Skin: Skin is warm and dry. No rash noted.    ED Course  Procedures  none     Imaging Review Dg Knee Ap/lat W/sunrise Right  09/30/2013   CLINICAL DATA:  Knee pain, fall  EXAM: DG KNEE - 3 VIEWS  COMPARISON:  None.  FINDINGS: There is no evidence of fracture, dislocation, or joint effusion. There is no evidence of arthropathy or other focal bone abnormality. Mild soft tissue swelling. No radiopaque foreign body.  IMPRESSION: Mild soft tissue swelling.  No acute osseous finding.   Electronically Signed   By: Daryll Brod M.D.   On: 09/30/2013 13:33     MDM   1. Patellofemoral arthralgia of right knee    Hinged knee brace applied. Wear knee brace daytime.  May take Tylenol for pain.  Begin knee exercises. Followup with Dr. Aundria Mems ([facility]) if not improving about one week.     Kandra Nicolas, MD 09/30/13 [phone]

## 2013-10-06 ENCOUNTER — Encounter: Payer: 59 | Admitting: Cardiology

## 2013-10-06 ENCOUNTER — Ambulatory Visit (INDEPENDENT_AMBULATORY_CARE_PROVIDER_SITE_OTHER): Payer: 59 | Admitting: Cardiology

## 2013-10-06 ENCOUNTER — Encounter: Payer: Self-pay | Admitting: Cardiology

## 2013-10-06 VITALS — BP 90/70 | HR 60 | Ht 70.0 in | Wt 213.0 lb

## 2013-10-06 DIAGNOSIS — I4891 Unspecified atrial fibrillation: Secondary | ICD-10-CM

## 2013-10-06 DIAGNOSIS — Z8673 Personal history of transient ischemic attack (TIA), and cerebral infarction without residual deficits: Secondary | ICD-10-CM | POA: Insufficient documentation

## 2013-10-06 DIAGNOSIS — Z7901 Long term (current) use of anticoagulants: Secondary | ICD-10-CM | POA: Insufficient documentation

## 2013-10-06 MED ORDER — METOPROLOL TARTRATE 25 MG PO TABS: ORAL_TABLET | ORAL | Status: DC

## 2013-10-06 MED ORDER — RIVAROXABAN 20 MG PO TABS: 20.0000 mg | ORAL_TABLET | Freq: Every day | ORAL | Status: DC

## 2013-10-06 NOTE — Progress Notes (Signed)
[facility]. [address], [address], [place]  [address] Phone: [phone] Fax:  [phone]  Date:  10/06/2013   ID:  Stephen King, DOB [birth year], MRN [id number]  PCP:  Iran Planas, PA-C   History of Present Illness: Stephen King is a 63 y.o. male here for evaluation of atrial fibrillation with rapid ventricular response following hospitalization on 08/22/13. He's a history of diabetes, stroke in 2011 and presented with substernal chest pain radiating to his back lasting approximately 1-1/2 hours associated with shortness of breath lightheadedness, nausea rated as a 10 over 10 at its worst. Initial troponin was normal. He was placed on anticoagulation. Echocardiogram showed normal ejection fraction with no wall motion abnormalities. He was observed on telemetry and converted. 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  She just let a [employer] trip, boating in [place], 10 days no issues. No further CP.   He's had no alcohol use, no fevers, no surgeries, no thyroid issues. No amphetamines. He has had no further bouts of atrial fibrillation.  Wt Readings from Last 3 Encounters:  10/06/13 213 lb (96.616 kg)  09/30/13 209 lb (94.802 kg)  08/26/13 216 lb (97.977 kg)     Past Medical History  Diagnosis Date  . Stroke 10/11  . Hernia, umbilical   . Hypertension     Past Surgical History  Procedure Laterality Date  . Cataracts    . Hemorroidectomy    . C6-7 fusion  10/2005  . Neck fusion      Current Outpatient Prescriptions  Medication Sig Dispense Refill  . acetaminophen (TYLENOL) 325 MG tablet Take 2 tablets (650 mg total) by mouth every 6 (six) hours as needed for mild pain (or Fever >/= 101).      . metoprolol tartrate (LOPRESSOR) 25 MG tablet TAKE 1/2 TABLET BY MOUTH TWICE DAILY  90 tablet  1  . rivaroxaban (XARELTO) 20 MG TABS tablet Take 1 tablet (20 mg total) by mouth daily with supper.  30 tablet  1  . fluticasone (FLONASE) 50  MCG/ACT nasal spray Place 2 sprays into the nose daily.  16 g  11   No current facility-administered medications for this visit.    Allergies:    Allergies  Allergen Reactions  . Piroxicam     Social History:  The patient  reports that he quit smoking about 14 years ago. His smoking use included Cigarettes. He smoked 0.00 packs per day. He does not have any smokeless tobacco history on file. He reports that he does not drink alcohol or use illicit drugs.   Family History  Problem Relation Age of Onset  . Alcohol abuse Mother   . Colon cancer Father   . Alcohol abuse Father   . Stroke Maternal Grandfather     ROS:  Please see the history of present illness.   Denies any bleeding, syncope, orthopnea, PND. 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 Just completed Iris study.   All other systems reviewed and negative.   PHYSICAL EXAM: VS:  BP 90/70  Pulse 60  Ht 5\' 10"  (1.778 m)  Wt 213 lb (96.616 kg)  BMI 30.56 kg/m2 Well nourished, well developed, in no acute distress HEENT: [place]/AT, EOMI, right parotid mass. Rosacea Neck: no JVD, normal carotid upstroke, no bruit Cardiac:  normal S1, S2; RRR; no murmur Lungs:  clear to auscultation bilaterally, no wheezing, rhonchi or rales Abd: soft,  nontender, no hepatomegaly, no bruits Ext: no edema, 2+ distal pulses Skin: warm and dry GU: deferred Neuro: no focal abnormalities noted, AAO x 3  EKG:  08/21/13-sinus rhythm, no other abnormalities 08/20/13-atrial fibrillation heart rate 165 with nonspecific ST-T wave changes   ECHO: 08/21/13 - Left ventricle: The cavity size was normal. There was mild concentric hypertrophy. Systolic function was normal. The estimated ejection fraction was in the range of 60% to 65%. Wall motion was normal; there were no regional wall motion abnormalities. - Mitral valve: There was systolic anterior motion. There was mild regurgitation. - Right ventricle: The cavity size was mildly  dilated. Wall thickness was normal. - Right atrium: The atrium was mildly dilated.    Labs: 08/21/13-troponin normal, hemoglobin A1c 5.1, TSH 1.3, creatinine 0.9, LDL 67, hemoglobin 12.8  ASSESSMENT AND PLAN:  1. Paroxysmal atrial fibrillation with rapid ventricular response - currently sinus rhythm. Low-dose metoprolol. Soft blood pressure at baseline. He's not had any symptoms. Continue with current medications. 2. Chest pain-in the setting of atrial fibrillation with rapid ventricular response-we discussed the possibility of stress testing. Since he has not had any further exertional anginal symptoms, we will not performed at this time however if she begins to have any symptoms, we will proceed with stress test. At this time also, his right knee is bothering him. 3. Anticoagulation- Xarelto. CHADS-VASc ( CVA-2) expressed the importance of anticoagulation. He understands. Continue. No signs of bleeding. Basic metabolic profile and CBC every 6 months. 4. Six-month followup  Signed, Candee Furbish, MD [facility]  10/06/2013 3:31 PM

## 2013-10-06 NOTE — Patient Instructions (Signed)
The current medical regimen is effective;  continue present plan and medications.  Follow up in 6 months with Dr. Skains.  You will receive a letter in the mail 2 months before you are due.  Please call us when you receive this letter to schedule your follow up appointment.  

## 2013-10-07 ENCOUNTER — Encounter: Payer: Self-pay | Admitting: Sports Medicine

## 2013-10-07 ENCOUNTER — Ambulatory Visit (INDEPENDENT_AMBULATORY_CARE_PROVIDER_SITE_OTHER): Payer: 59 | Admitting: Sports Medicine

## 2013-10-07 VITALS — BP 106/69 | HR 68 | Ht 70.0 in | Wt 214.0 lb

## 2013-10-07 DIAGNOSIS — M170 Bilateral primary osteoarthritis of knee: Secondary | ICD-10-CM | POA: Insufficient documentation

## 2013-10-07 DIAGNOSIS — M2241 Chondromalacia patellae, right knee: Secondary | ICD-10-CM

## 2013-10-07 DIAGNOSIS — M2240 Chondromalacia patellae, unspecified knee: Secondary | ICD-10-CM

## 2013-10-07 MED ORDER — TRAMADOL HCL 50 MG PO TABS: ORAL_TABLET | ORAL | Status: DC

## 2013-10-07 NOTE — Assessment & Plan Note (Signed)
Injection as above. Tramadol for as needed pain. Formal physical therapy, return in a month.

## 2013-10-07 NOTE — Progress Notes (Signed)
   Subjective:    I'm seeing this patient as a consultation for: Dr. Alden Hipp   CC: Pain and clicking in knee  HPI: Patient is a 63 year old man with history of a-fib, and CVA, who comes to the clinic today with pain and a clicking sensation in his knee secondary to a traumatic injury sustained on a weekend trip. Patient fell on the bent knee and since then, has experienced a painful click on extension of the knee. He says the knee is not painful except for when the click occurs. Stairs make it worse. Pain is moderate, persistent.  Past medical history, Surgical history, Family history not pertinant except as noted below, Social history, Allergies, and medications have been entered into the medical record, reviewed, and no changes needed.   Objective:   General: Well Developed, well nourished, and in no acute distress.  Neuro/Psych: Alert and oriented x3, extra-ocular muscles intact, able to move all 4 extremities, sensation grossly intact. Skin: Warm and dry, no rashes noted.  Respiratory: Not using accessory muscles, speaking in full sentences, trachea midline.  Cardiovascular: Pulses palpable, no extremity edema. Abdomen: Does not appear distended. Right Knee: Normal to inspection with no erythema or effusion or obvious bony abnormalities. Palpation with no warmth, joint line tenderness or condyle tenderness. There is tenderness particularly under the medial patellar facet. ROM full in flexion and extension and lower leg rotation. Ligaments with solid consistent endpoints including ACL, PCL, LCL, MCL. Negative Mcmurray's, Apley's, and Thessalonian tests. Non painful patellar compression. Patellar glide without crepitus. Patellar and quadriceps tendons unremarkable. Hamstring and quadriceps strength is normal.   Procedure:  Injection of right knee Consent obtained and verified. Time-out conducted. Noted no overlying erythema, induration, or other signs of local infection. Skin  prepped in a sterile fashion. Topical analgesic spray: Ethyl chloride. Completed without difficulty. Meds: 25-gauge needle advanced just inferior and medial to the inferior pole of the patella, 2 cc kenalog 40, 4 cc lidocaine injected easily. Pain immediately improved suggesting accurate placement of the medication. Advised to call if fevers/chills, erythema, induration, drainage, or persistent bleeding.  Impression and Recommendations:    Patellar tenderness suggests that the pain is patellofemoral in nature. Meniscal injury was considered, but negative McMurray's and Thessalonian tests suggest that meniscal injury is improbable. We are prescribing NSAIDS and physical therapy. We administered a kenalog shot into the joint with excellent relief.  Patient was also counseled to follow up with his PCP regarding AKs on both arms and a suspicious mole on the right arm.

## 2013-10-11 ENCOUNTER — Ambulatory Visit (INDEPENDENT_AMBULATORY_CARE_PROVIDER_SITE_OTHER): Payer: 59 | Admitting: Physician Assistant

## 2013-10-11 ENCOUNTER — Encounter: Payer: Self-pay | Admitting: Physician Assistant

## 2013-10-11 VITALS — BP 107/68 | HR 67 | Ht 70.0 in | Wt 214.0 lb

## 2013-10-11 DIAGNOSIS — L570 Actinic keratosis: Secondary | ICD-10-CM

## 2013-10-11 DIAGNOSIS — D239 Other benign neoplasm of skin, unspecified: Secondary | ICD-10-CM

## 2013-10-11 DIAGNOSIS — D229 Melanocytic nevi, unspecified: Secondary | ICD-10-CM

## 2013-10-13 ENCOUNTER — Ambulatory Visit (INDEPENDENT_AMBULATORY_CARE_PROVIDER_SITE_OTHER): Payer: 59 | Admitting: Physical Therapy

## 2013-10-13 DIAGNOSIS — M25569 Pain in unspecified knee: Secondary | ICD-10-CM

## 2013-10-13 DIAGNOSIS — M25669 Stiffness of unspecified knee, not elsewhere classified: Secondary | ICD-10-CM

## 2013-10-13 DIAGNOSIS — R609 Edema, unspecified: Secondary | ICD-10-CM

## 2013-10-13 DIAGNOSIS — M6281 Muscle weakness (generalized): Secondary | ICD-10-CM

## 2013-10-13 DIAGNOSIS — M2240 Chondromalacia patellae, unspecified knee: Secondary | ICD-10-CM

## 2013-10-15 DIAGNOSIS — D181 Lymphangioma, any site: Secondary | ICD-10-CM | POA: Insufficient documentation

## 2013-10-15 NOTE — Progress Notes (Signed)
   Subjective:    Patient ID: Stephen King, male    DOB: Nov 04, 1950, 63 y.o.   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    Review of Systems  All other systems reviewed and are negative.      Objective:   Physical Exam  Constitutional: He appears well-developed and well-nourished.  Skin:  Right lower neck atyptical irregular shaped nevus that appears to be growing approximately 20mm by 20mm.   Bilateral Actinic keratosis on bilateral arms.           Assessment & Plan:  Atypical nevus- Shave Biopsy Procedure Note  Pre-operative Diagnosis: Suspicious lesion  Post-operative Diagnosis: same  Locations: right lower neck  Indications: growing and changing in colors  Anesthesia: Lidocaine 1% without epinephrine without added sodium bicarbonate  Procedure Details  History of allergy to iodine: no  Patient informed of the risks (including bleeding and infection) and benefits of the  procedure and Verbal informed consent obtained.  The lesion and surrounding area were given a sterile prep using alcohol and draped in the usual sterile fashion. A scalpel was used to shave an area of skin approximately 79mm by 86mm.  Hemostasis achieved with alumuninum chloride. Antibiotic ointment and a sterile dressing applied.  The specimen was sent for pathologic examination. The patient tolerated the procedure well.  EBL: scant   Findings:   Condition: Stable  Complications: none.  Plan: 1. Instructed to keep the wound dry and covered for 24-48h and clean thereafter. 2. Warning signs of infection were reviewed.   3. Recommended that the patient use OTC acetaminophen as needed for pain.   aK's- discussed precancerous lesions. Encouraged sunscreen.  Cryotherapy Procedure Note  Pre-operative Diagnosis: Actinic keratosis  Post-operative Diagnosis: Actinic keratosis  Locations: bilateral lower arms  multiple sites.   Indications: precancerous  Anesthesia: none  Procedure Details  History of allergy to iodine: no. Pacemaker? no.  Patient informed of risks (permanent scarring, infection, light or dark discoloration, bleeding, infection, weakness, numbness and recurrence of the lesion) and benefits of the procedure and verbal informed consent obtained.  The areas are treated with liquid nitrogen therapy, frozen until ice ball extended 2 mm beyond lesion, allowed to thaw, and treated again. The patient tolerated procedure well.  The patient was instructed on post-op care, warned that there may be blister formation, redness and pain. Recommend OTC analgesia as needed for pain.  Condition: Stable  Complications: none.  Plan: 1. Instructed to keep the area dry and covered for 24-48h and clean thereafter. 2. Warning signs of infection were reviewed.   3. Recommended that the patient use OTC acetaminophen as needed for pain.  4. Return in 2 weeks.

## 2013-10-17 ENCOUNTER — Encounter (INDEPENDENT_AMBULATORY_CARE_PROVIDER_SITE_OTHER): Payer: 59 | Admitting: Physical Therapy

## 2013-10-17 DIAGNOSIS — M6281 Muscle weakness (generalized): Secondary | ICD-10-CM

## 2013-10-17 DIAGNOSIS — M25569 Pain in unspecified knee: Secondary | ICD-10-CM

## 2013-10-17 DIAGNOSIS — M2240 Chondromalacia patellae, unspecified knee: Secondary | ICD-10-CM

## 2013-10-17 DIAGNOSIS — R609 Edema, unspecified: Secondary | ICD-10-CM

## 2013-10-17 DIAGNOSIS — M25669 Stiffness of unspecified knee, not elsewhere classified: Secondary | ICD-10-CM

## 2013-10-20 ENCOUNTER — Encounter (INDEPENDENT_AMBULATORY_CARE_PROVIDER_SITE_OTHER): Payer: 59

## 2013-10-20 DIAGNOSIS — M25669 Stiffness of unspecified knee, not elsewhere classified: Secondary | ICD-10-CM

## 2013-10-20 DIAGNOSIS — M6281 Muscle weakness (generalized): Secondary | ICD-10-CM

## 2013-10-20 DIAGNOSIS — M25569 Pain in unspecified knee: Secondary | ICD-10-CM

## 2013-10-20 DIAGNOSIS — R230 Cyanosis: Secondary | ICD-10-CM

## 2013-10-20 DIAGNOSIS — M2240 Chondromalacia patellae, unspecified knee: Secondary | ICD-10-CM

## 2013-10-27 DIAGNOSIS — R609 Edema, unspecified: Secondary | ICD-10-CM

## 2013-10-27 DIAGNOSIS — M6281 Muscle weakness (generalized): Secondary | ICD-10-CM

## 2013-10-27 DIAGNOSIS — M2240 Chondromalacia patellae, unspecified knee: Secondary | ICD-10-CM

## 2013-10-27 DIAGNOSIS — M25669 Stiffness of unspecified knee, not elsewhere classified: Secondary | ICD-10-CM

## 2013-10-27 DIAGNOSIS — M25569 Pain in unspecified knee: Secondary | ICD-10-CM

## 2013-11-04 ENCOUNTER — Ambulatory Visit: Payer: 59 | Admitting: Sports Medicine

## 2013-11-04 ENCOUNTER — Encounter: Payer: Self-pay | Admitting: Sports Medicine

## 2013-11-04 ENCOUNTER — Ambulatory Visit (INDEPENDENT_AMBULATORY_CARE_PROVIDER_SITE_OTHER): Payer: 59 | Admitting: Sports Medicine

## 2013-11-04 VITALS — BP 104/68 | HR 57 | Ht 70.0 in | Wt 209.0 lb

## 2013-11-04 DIAGNOSIS — M25511 Pain in right shoulder: Secondary | ICD-10-CM | POA: Insufficient documentation

## 2013-11-04 DIAGNOSIS — M2240 Chondromalacia patellae, unspecified knee: Secondary | ICD-10-CM

## 2013-11-04 DIAGNOSIS — M25519 Pain in unspecified shoulder: Secondary | ICD-10-CM

## 2013-11-04 DIAGNOSIS — M2241 Chondromalacia patellae, right knee: Secondary | ICD-10-CM

## 2013-11-04 NOTE — Assessment & Plan Note (Signed)
Starting Visco supplementation into the right knee. Return in a week for injection #2.

## 2013-11-04 NOTE — Progress Notes (Signed)
  Subjective:    CC: Followup  HPI: Right knee patellar chondromalacia: Did not improve after initial injection at the last visit, he has done 2 weeks of physical therapy, desires to proceed to the next step, pain is moderate, persistent, localized anteriorly with grinding.  Right shoulder pain: About 3 months ago had a unguided subacromial injection, now having a recurrence of pain over the deltoid, worse with overhead activities. No trauma. Pain is moderate, persistent.  Past medical history, Surgical history, Family history not pertinant except as noted below, Social history, Allergies, and medications have been entered into the medical record, reviewed, and no changes needed.   Review of Systems: No fevers, chills, night sweats, weight loss, chest pain, or shortness of breath.   Objective:    General: Well Developed, well nourished, and in no acute distress.  Neuro: Alert and oriented x3, extra-ocular muscles intact, sensation grossly intact.  HEENT: Normocephalic, atraumatic, pupils equal round reactive to light, neck supple, no masses, no lymphadenopathy, thyroid nonpalpable.  Skin: Warm and dry, no rashes. Cardiac: Regular rate and rhythm, no murmurs rubs or gallops, no lower extremity edema.  Respiratory: Clear to auscultation bilaterally. Not using accessory muscles, speaking in full sentences. Right Shoulder: Inspection reveals no abnormalities, atrophy or asymmetry. Palpation is normal with no tenderness over AC joint or bicipital groove. ROM is full in all planes. Rotator cuff strength normal throughout. Positive Neer and Hawkin's tests, empty can. Speeds and Yergason's tests normal. No labral pathology noted with negative Obrien's, negative crank, negative clunk, and good stability. Normal scapular function observed. No painful arc and no drop arm sign. No apprehension sign Right Knee: Normal to inspection with no erythema or effusion or obvious bony  abnormalities. Palpation normal with no warmth or joint line tenderness or patellar tenderness or condyle tenderness. ROM normal in flexion and extension and lower leg rotation. Ligaments with solid consistent endpoints including ACL, PCL, LCL, MCL. Negative Mcmurray's and provocative meniscal tests. Positive painful patellar compression with grind. Patellar and quadriceps tendons unremarkable. Hamstring and quadriceps strength is normal.  Procedure: Real-time Ultrasound Guided Injection of right knee Device: GE Logiq E  Verbal informed consent obtained.  Time-out conducted.  Noted no overlying erythema, induration, or other signs of local infection.  Skin prepped in a sterile fashion.  Local anesthesia: Topical Ethyl chloride.  With sterile technique and under real time ultrasound guidance:   30 mg/2 mL of OrthoVisc (sodium hyaluronate) in a prefilled syringe was injected easily into the knee through a 22-gauge needle. Completed without difficulty  Pain immediately resolved suggesting accurate placement of the medication.  Advised to call if fevers/chills, erythema, induration, drainage, or persistent bleeding.  Images permanently stored and available for review in the ultrasound unit.  Impression: Technically successful ultrasound guided injection.  Procedure: Real-time Ultrasound Guided Injection of right subacromial bursa Device: GE Logiq E  Verbal informed consent obtained.  Time-out conducted.  Noted no overlying erythema, induration, or other signs of local infection.  Skin prepped in a sterile fashion.  Local anesthesia: Topical Ethyl chloride.  With sterile technique and under real time ultrasound guidance:  1 cc kenalog 40, 3 cc lidocaine injected easily. Completed without difficulty  Pain immediately resolved suggesting accurate placement of the medication.  Advised to call if fevers/chills, erythema, induration, drainage, or persistent bleeding.  Images permanently  stored and available for review in the ultrasound unit.  Impression: Technically successful ultrasound guided injection.  Impression and Recommendations:

## 2013-11-04 NOTE — Assessment & Plan Note (Signed)
Symptoms are predominantly impingement. Repeat subacromial injection, return in a month.

## 2013-11-08 ENCOUNTER — Other Ambulatory Visit: Payer: Self-pay | Admitting: Physician Assistant

## 2013-11-08 ENCOUNTER — Encounter: Payer: Self-pay | Admitting: Physician Assistant

## 2013-11-08 MED ORDER — FLUOCINONIDE 0.05 % EX CREA: 1.0000 "application " | TOPICAL_CREAM | Freq: Two times a day (BID) | CUTANEOUS | Status: DC

## 2013-11-11 ENCOUNTER — Encounter: Payer: Self-pay | Admitting: Sports Medicine

## 2013-11-11 ENCOUNTER — Ambulatory Visit (INDEPENDENT_AMBULATORY_CARE_PROVIDER_SITE_OTHER): Payer: 59 | Admitting: Sports Medicine

## 2013-11-11 VITALS — BP 102/65 | HR 64 | Ht 70.0 in | Wt 205.0 lb

## 2013-11-11 DIAGNOSIS — M25511 Pain in right shoulder: Secondary | ICD-10-CM

## 2013-11-11 DIAGNOSIS — M2240 Chondromalacia patellae, unspecified knee: Secondary | ICD-10-CM

## 2013-11-11 DIAGNOSIS — M2241 Chondromalacia patellae, right knee: Secondary | ICD-10-CM

## 2013-11-11 NOTE — Assessment & Plan Note (Signed)
Doing a little bit better, OrthoVisc injection #2 into the right knee. Return in one week for #3.

## 2013-11-11 NOTE — Progress Notes (Signed)

## 2013-11-11 NOTE — Assessment & Plan Note (Signed)
Resolved with subacromial injection.

## 2013-11-14 ENCOUNTER — Other Ambulatory Visit: Payer: Self-pay | Admitting: Physician Assistant

## 2013-11-15 ENCOUNTER — Other Ambulatory Visit: Payer: Self-pay | Admitting: Physician Assistant

## 2013-11-18 ENCOUNTER — Encounter: Payer: Self-pay | Admitting: Sports Medicine

## 2013-11-18 ENCOUNTER — Ambulatory Visit (INDEPENDENT_AMBULATORY_CARE_PROVIDER_SITE_OTHER): Payer: 59 | Admitting: Sports Medicine

## 2013-11-18 VITALS — BP 100/66 | HR 59 | Ht 70.0 in | Wt 204.0 lb

## 2013-11-18 DIAGNOSIS — M2241 Chondromalacia patellae, right knee: Secondary | ICD-10-CM

## 2013-11-18 DIAGNOSIS — M2240 Chondromalacia patellae, unspecified knee: Secondary | ICD-10-CM

## 2013-11-18 NOTE — Assessment & Plan Note (Signed)
OrthoVisc injection #3 into the right knee. Doing well. Return in one week for #4.

## 2013-11-18 NOTE — Progress Notes (Signed)

## 2013-11-25 ENCOUNTER — Encounter: Payer: Self-pay | Admitting: Sports Medicine

## 2013-11-25 ENCOUNTER — Ambulatory Visit (INDEPENDENT_AMBULATORY_CARE_PROVIDER_SITE_OTHER): Payer: 59 | Admitting: Sports Medicine

## 2013-11-25 VITALS — BP 101/69 | HR 61 | Ht 70.0 in | Wt 205.0 lb

## 2013-11-25 DIAGNOSIS — M2240 Chondromalacia patellae, unspecified knee: Secondary | ICD-10-CM

## 2013-11-25 DIAGNOSIS — M2241 Chondromalacia patellae, right knee: Secondary | ICD-10-CM

## 2013-11-25 MED ORDER — DICLOFENAC SODIUM 2 % TD SOLN: 2.0000 | Freq: Two times a day (BID) | TRANSDERMAL | Status: DC

## 2013-11-25 NOTE — Assessment & Plan Note (Signed)
OrthoVisc injection #4 into the right knee. Return in one month as needed, due to anticoagulation we cannot use NSAIDs, adding topical diclofenac.

## 2013-11-25 NOTE — Progress Notes (Signed)

## 2013-12-12 ENCOUNTER — Other Ambulatory Visit: Payer: Self-pay | Admitting: Physician Assistant

## 2013-12-12 ENCOUNTER — Other Ambulatory Visit: Payer: Self-pay

## 2013-12-12 MED ORDER — RIVAROXABAN 20 MG PO TABS: 20.0000 mg | ORAL_TABLET | Freq: Every day | ORAL | Status: DC

## 2013-12-12 MED ORDER — METOPROLOL TARTRATE 25 MG PO TABS: 12.5000 mg | ORAL_TABLET | Freq: Two times a day (BID) | ORAL | Status: DC

## 2013-12-27 ENCOUNTER — Encounter: Payer: Self-pay | Admitting: Sports Medicine

## 2014-01-06 ENCOUNTER — Other Ambulatory Visit: Payer: Self-pay

## 2014-01-09 ENCOUNTER — Other Ambulatory Visit: Payer: Self-pay | Admitting: Physician Assistant

## 2014-01-10 MED ORDER — METOPROLOL TARTRATE 25 MG PO TABS: 12.5000 mg | ORAL_TABLET | Freq: Two times a day (BID) | ORAL | Status: DC

## 2014-01-16 ENCOUNTER — Encounter: Payer: Self-pay | Admitting: Physician Assistant

## 2014-01-16 ENCOUNTER — Ambulatory Visit (INDEPENDENT_AMBULATORY_CARE_PROVIDER_SITE_OTHER): Payer: 59 | Admitting: Physician Assistant

## 2014-01-16 VITALS — BP 123/83 | HR 53 | Temp 97.6°F | Ht 70.0 in | Wt 209.0 lb

## 2014-01-16 DIAGNOSIS — J069 Acute upper respiratory infection, unspecified: Secondary | ICD-10-CM

## 2014-01-16 DIAGNOSIS — I4891 Unspecified atrial fibrillation: Secondary | ICD-10-CM

## 2014-01-16 MED ORDER — METOPROLOL TARTRATE 25 MG PO TABS: 12.5000 mg | ORAL_TABLET | Freq: Two times a day (BID) | ORAL | Status: DC

## 2014-01-16 NOTE — Patient Instructions (Signed)
Upper Respiratory Infection, Adult An upper respiratory infection (URI) is also known as the common cold. It is often caused by a type of germ (virus). Colds are easily spread (contagious). You can pass it to others by kissing, coughing, sneezing, or drinking out of the same glass. Usually, you get better in 1 or 2 weeks.  HOME CARE   Only take medicine as told by your doctor.  Use a warm mist humidifier or breathe in steam from a hot shower.  Drink enough water and fluids to keep your pee (urine) clear or pale yellow.  Get plenty of rest.  Return to work when your temperature is back to normal or as told by your doctor. You may use a face mask and wash your hands to stop your cold from spreading. GET HELP RIGHT AWAY IF:   After the first few days, you feel you are getting worse.  You have questions about your medicine.  You have chills, shortness of breath, or brown or red spit (mucus).  You have yellow or brown snot (nasal discharge) or pain in the face, especially when you bend forward.  You have a fever, puffy (swollen) neck, pain when you swallow, or white spots in the back of your throat.  You have a bad headache, ear pain, sinus pain, or chest pain.  You have a high-pitched whistling sound when you breathe in and out (wheezing).  You have a lasting cough or cough up blood.  You have sore muscles or a stiff neck. MAKE SURE YOU:   Understand these instructions.  Will watch your condition.  Will get help right away if you are not doing well or get worse. Document Released: 08/27/2007 Document Revised: 06/02/2011 Document Reviewed: 06/15/2013 Exit[employer] Patient Information 2015 [employer]. This information is not intended to replace advice given to you by your health care provider. Make sure you discuss any questions you have with your health care provider.  

## 2014-01-17 NOTE — Progress Notes (Signed)
   Subjective:    Patient ID: Stephen King, male    DOB: 1950/04/30, 63 y.o.   MRN: [id number]  HPI Pt presents to the clinic to get a medication refill.   Atrial fibrillation- taking metorpolol daily with no complications. No problems or concerns.   Pt dose admit that he has had URI symptoms for about a week. Seems to be improving this morning. Denies any fever, chills, ear pain or sinus pressure. Has off and on dry to productive cough. No wheezing. Mostly nasal congestion. Tried OTC tylenol cold and cough with some relief.    Review of Systems  All other systems reviewed and are negative.      Objective:   Physical Exam  Constitutional: He is oriented to person, place, and time. He appears well-developed and well-nourished.  HENT:  Head: Normocephalic and atraumatic.  Right Ear: External ear normal.  Left Ear: External ear normal.  Mouth/Throat: Oropharynx is clear and moist.  TM's clear bilaterally.   Bilateral nasal turbinates red and swollen with rhinorrhea present.   Negative for any sinus pressure or tenderness.   Eyes: Conjunctivae are normal. Right eye exhibits no discharge. Left eye exhibits no discharge.  Neck: Normal range of motion. Neck supple.  Cardiovascular: Normal rate, regular rhythm and normal heart sounds.   Pulmonary/Chest: Effort normal and breath sounds normal. He has no wheezes.  Lymphadenopathy:    He has no cervical adenopathy.  Neurological: He is alert and oriented to person, place, and time.  Skin: Skin is dry.  Psychiatric: He has a normal mood and affect. His behavior is normal.          Assessment & Plan:  Atrial fibrillation- refilled for 6 months.   URI- consider adding regular mucinex and flonase for next couple of days.Gave HO. Keep doing what you are doing. Should continue to get better. If developing more sinus pressure and ear pain may consider sending abx. Please call office if not improving.

## 2014-03-08 ENCOUNTER — Other Ambulatory Visit: Payer: Self-pay | Admitting: *Deleted

## 2014-03-08 ENCOUNTER — Other Ambulatory Visit: Payer: Self-pay

## 2014-03-08 MED ORDER — METOPROLOL TARTRATE 25 MG PO TABS: 12.5000 mg | ORAL_TABLET | Freq: Two times a day (BID) | ORAL | Status: DC

## 2014-04-04 ENCOUNTER — Encounter: Payer: Self-pay | Admitting: Cardiology

## 2014-04-04 ENCOUNTER — Ambulatory Visit (INDEPENDENT_AMBULATORY_CARE_PROVIDER_SITE_OTHER): Payer: 59 | Admitting: Cardiology

## 2014-04-04 VITALS — BP 98/64 | HR 63 | Ht 70.0 in | Wt 210.0 lb

## 2014-04-04 DIAGNOSIS — Z7901 Long term (current) use of anticoagulants: Secondary | ICD-10-CM

## 2014-04-04 DIAGNOSIS — I4891 Unspecified atrial fibrillation: Secondary | ICD-10-CM

## 2014-04-04 NOTE — Patient Instructions (Signed)
The current medical regimen is effective;  continue present plan and medications.  Please have blood work today (BMP,CBC)  Follow up in 6 months with Dr. Marlou Porch.  You will receive a letter in the mail 2 months before you are due.  Please call us when you receive this letter to schedule your follow up appointment.  Thank you for choosing [facility]!!

## 2014-04-04 NOTE — Progress Notes (Signed)
[facility]. [address], [place]  [address] Phone: [phone] Fax:  [phone]  Date:  04/04/2014   ID:  Stephen King, DOB [birth year], MRN [id number]  PCP:  Iran Planas, PA-C   History of Present Illness: Stephen King is a 64 y.o. male here for evaluation of atrial fibrillation with rapid ventricular response following hospitalization on 08/22/13. Stroke in 2011 and presented with substernal chest pain radiating to his back lasting approximately 1-1/2 hours associated with shortness of breath lightheadedness, nausea rated as a 10 over 10 at its worst. Initial troponin was normal. He was placed on anticoagulation. Echocardiogram showed normal ejection fraction with no wall motion abnormalities. He was observed on telemetry and converted. 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  Just led a [employer] trip, boating in [place], 10 days no issues. No further CP.   He's had no alcohol use, no fevers, no surgeries, no thyroid issues. No amphetamines. He has had no further bouts of atrial fibrillation.  Wt Readings from Last 3 Encounters:  04/04/14 210 lb (95.255 kg)  01/16/14 209 lb (94.802 kg)  11/25/13 205 lb (92.987 kg)     Past Medical History  Diagnosis Date  . Stroke 10/11  . Hernia, umbilical   . Hypertension     Past Surgical History  Procedure Laterality Date  . Cataracts    . Hemorroidectomy    . C6-7 fusion  10/2005  . Neck fusion      Current Outpatient Prescriptions  Medication Sig Dispense Refill  . acetaminophen (TYLENOL) 325 MG tablet Take 2 tablets (650 mg total) by mouth every 6 (six) hours as needed for mild pain (or Fever >/= 101).    . fluocinonide cream (LIDEX) 6.65 % Apply 1 application topically 2 (two) times daily. 60 g 0  . metoprolol tartrate (LOPRESSOR) 25 MG tablet Take 0.5 tablets (12.5 mg total) by mouth 2 (two) times daily. 90 tablet 5  . rivaroxaban (XARELTO) 20 MG TABS tablet Take 1 tablet (20  mg total) by mouth daily with supper. 30 tablet 0   No current facility-administered medications for this visit.    Allergies:    Allergies  Allergen Reactions  . Piroxicam     Social History:  The patient  reports that he quit smoking about 15 years ago. His smoking use included Cigarettes. He smoked 0.00 packs per day. He does not have any smokeless tobacco history on file. He reports that he does not drink alcohol or use illicit drugs.   Family History  Problem Relation Age of Onset  . Alcohol abuse Mother   . Colon cancer Father   . Alcohol abuse Father   . Stroke Maternal Grandfather     ROS:  Please see the history of present illness.   Denies any bleeding, syncope, orthopnea, PND. 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 Just completed Iris study.   All other systems reviewed and negative.   PHYSICAL EXAM: VS:  BP 98/64 mmHg  Pulse 63  Ht 5\' 10"  (1.778 m)  Wt 210 lb (95.255 kg)  BMI 30.13 kg/m2 Well nourished, well developed, in no acute distress HEENT: [place]/AT, EOMI, right parotid mass. Rosacea Neck: no JVD, normal carotid upstroke, no bruit Cardiac:  normal S1, S2; RRR; no murmur Lungs:  clear to auscultation bilaterally, no wheezing, rhonchi or rales Abd: soft, nontender, no hepatomegaly, no bruits Ext: no edema,  2+ distal pulses Skin: warm and dry GU: deferred Neuro: no focal abnormalities noted, AAO x 3  EKG:  04/04/14-sinus rhythm, 63, no other abnormalities-previous 08/21/13-sinus rhythm, no other abnormalities 08/20/13-atrial fibrillation heart rate 165 with nonspecific ST-T wave changes   ECHO: 08/21/13 - Left ventricle: The cavity size was normal. There was mild concentric hypertrophy. Systolic function was normal. The estimated ejection fraction was in the range of 60% to 65%. Wall motion was normal; there were no regional wall motion abnormalities. - Mitral valve: There was systolic anterior motion. There was  mild regurgitation. - Right ventricle: The cavity size was mildly dilated. Wall thickness was normal. - Right atrium: The atrium was mildly dilated.    Labs: 08/21/13-troponin normal, hemoglobin A1c 5.1, TSH 1.3, creatinine 0.9, LDL 67, hemoglobin 12.8  ASSESSMENT AND PLAN:  1. Paroxysmal atrial fibrillation with rapid ventricular response - currently sinus rhythm. Low-dose metoprolol. Soft blood pressure at baseline. He's not had any symptoms. Continue with current medications. 2. Chest pain-in the setting of atrial fibrillation with rapid ventricular response-we discussed the possibility of stress testing. Since he has not had any further exertional anginal symptoms, we will not performed at this time however if he begins to have any symptoms, we will proceed with stress test.  Did very well during boating trip. 3. Anticoagulation- Xarelto. CHADS-VASc ( CVA-2) expressed the importance of anticoagulation. He understands. Continue. No signs of bleeding. Basic metabolic profile and CBC every 6 months. 4. Six-month followup  Signed, Candee Furbish, MD [facility]  04/04/2014 3:36 PM

## 2014-04-05 LAB — CBC
HCT: 44.8 % (ref 39.0–52.0)
Hemoglobin: 14.8 g/dL (ref 13.0–17.0)
MCHC: 33 g/dL (ref 30.0–36.0)
MCV: 94.7 fl (ref 78.0–100.0)
Platelets: 298 10*3/uL (ref 150.0–400.0)
RBC: 4.73 Mil/uL (ref 4.22–5.81)
RDW: 13.4 % (ref 11.5–15.5)
WBC: 8.3 10*3/uL (ref 4.0–10.5)

## 2014-04-05 LAB — BASIC METABOLIC PANEL
BUN: 13 mg/dL (ref 6–23)
CHLORIDE: 105 meq/L (ref 96–112)
CO2: 28 meq/L (ref 19–32)
Calcium: 8.7 mg/dL (ref 8.4–10.5)
Creatinine, Ser: 0.94 mg/dL (ref 0.40–1.50)
GFR: 86.22 mL/min (ref >60.00)
Glucose, Bld: 87 mg/dL (ref 70–99)
POTASSIUM: 3.9 meq/L (ref 3.5–5.1)
Sodium: 138 mEq/L (ref 135–145)

## 2014-05-15 ENCOUNTER — Ambulatory Visit (INDEPENDENT_AMBULATORY_CARE_PROVIDER_SITE_OTHER): Payer: 59 | Admitting: Physician Assistant

## 2014-05-15 ENCOUNTER — Encounter: Payer: Self-pay | Admitting: Physician Assistant

## 2014-05-15 VITALS — BP 101/62 | HR 61 | Temp 97.6°F | Resp 16 | Ht 70.0 in | Wt 212.0 lb

## 2014-05-15 DIAGNOSIS — M25511 Pain in right shoulder: Secondary | ICD-10-CM | POA: Diagnosis not present

## 2014-05-15 MED ORDER — DICLOFENAC SODIUM 2 % TD SOLN: 2.0000 | Freq: Two times a day (BID) | TRANSDERMAL | Status: DC

## 2014-05-16 ENCOUNTER — Encounter: Payer: Self-pay | Admitting: Physician Assistant

## 2014-05-16 NOTE — Progress Notes (Signed)
   Subjective:    Patient ID: Stephen King, male    DOB: 22-May-1950, 64 y.o.   MRN: [id number]  HPI Patient is a 64 year old male who presents to the clinic with right shoulder pain that has started again. He has had a history of this right shoulder pain in the past that has resolved with subacromial injections. First injection was given by myself and lasted 3 months. The next injection was given by Dr. Darene Lamer and lasted almost 6 months. He admits he has been painting the ceiling and doing a lot of construction work in his house with overhead movements. The pain is persistently got worse for the last 2 weeks. He cannot take oral NSAIDs due to anticoagulation. He did not have any topical NSAIDs to try.   Review of Systems  All other systems reviewed and are negative.      Objective:   Physical Exam  Constitutional: He is oriented to person, place, and time. He appears well-developed and well-nourished.  Musculoskeletal:  ROM decreased due to pain. Pain with external ROM and some internal.  Abduction to about 90 degrees.  Strength 5/5.   Neurological: He is alert and oriented to person, place, and time.  Skin: Skin is dry.  Psychiatric: He has a normal mood and affect. His behavior is normal.          Assessment & Plan:  Right shoulder pain- resolved in past with subacrominal injection. Offered to wait and get ultrasound guided pt wanted injection today. Given rx for pennsaid topical. Given exercises to help prolong injection. Discussed exacerbation of overuse. Offered formal PT declined at this point. Follow up with Dr. Darene Lamer as needed.   Subacromial Shoulder Injection Procedure Note  Pre-operative Diagnosis: right shoulder pain/bursitis vs impingement  Post-operative Diagnosis: same  Indications: pain  Anesthesia: ethyl chloride  Procedure Details   Verbal consent was obtained for the procedure. The shoulder was prepped with Betadine and the skin was anesthetized. A 27 gauge needle  was advanced into the subacromial space through posterior approach without difficulty  The space was then injected with 9 ml 1% lidocaine and 1 ml of depo medrol 40mg . The injection site was cleansed with isopropyl alcohol and a dressing was applied.  Complications:  None; patient tolerated the procedure well.

## 2014-07-26 ENCOUNTER — Encounter: Payer: Self-pay | Admitting: Physician Assistant

## 2014-07-26 ENCOUNTER — Ambulatory Visit (INDEPENDENT_AMBULATORY_CARE_PROVIDER_SITE_OTHER): Payer: 59 | Admitting: Physician Assistant

## 2014-07-26 VITALS — BP 111/74 | HR 69 | Wt 210.0 lb

## 2014-07-26 DIAGNOSIS — M25511 Pain in right shoulder: Secondary | ICD-10-CM

## 2014-07-26 MED ORDER — HYDROCODONE-ACETAMINOPHEN 5-325 MG PO TABS: 1.0000 | ORAL_TABLET | Freq: Three times a day (TID) | ORAL | Status: DC | PRN

## 2014-07-26 MED ORDER — METOPROLOL TARTRATE 25 MG PO TABS: 12.5000 mg | ORAL_TABLET | Freq: Two times a day (BID) | ORAL | Status: DC

## 2014-07-27 NOTE — Progress Notes (Signed)
   Subjective:    Patient ID: Stephen King, male    DOB: [birth year], 64 y.o.   MRN: [id number]  HPI Patient is a 64 year old male who presents to the clinic with moderate to severe persistent pain of his right shoulder. This is been ongoing for the last year and a half or so. He has received 3 injections GAVE him benefit with the last one only lasted for about 2 weeks. He is very active with rowing and hand D work. While he is doing so work she does not noticed the pain it is after that he experienced the pain. The pain will sometimes wake him up in the middle of the night. The Pennsaid screen he was using twice daily and was helping but he noticed a rash starting to develop. He has stopped and since the pain has increased.   Review of Systems  All other systems reviewed and are negative.      Objective:   Physical Exam  Constitutional: He is oriented to person, place, and time.  Musculoskeletal:  Right shoulder: No pain over AC joint or biceptal groove.  Pain over anterior and posterior shoulder.  Any internal or external movement is painful.  Empty can and Hawkins positive.  Strength of right arm 5/5.  Rotator cuff muscle seems stable.  Neurological: He is alert and oriented to person, place, and time.  Psychiatric: He has a normal mood and affect. His behavior is normal.          Assessment & Plan:  Right shoulder pain- will send to ortho. Right shoulder xray did show: Minimal degenerative spurring from the [facility] joint. No acute Abnormality. Perhaps spurring is causing impingement. No signs of rotator cuff pathology.  Last injection only last 2 weeks. pennsaid cream did help but if he uses too much causes a rash. Not a candidate for NSAID oral therapy. Continue icing and home exercises.

## 2014-09-18 ENCOUNTER — Other Ambulatory Visit: Payer: Self-pay

## 2014-09-19 ENCOUNTER — Encounter: Payer: Self-pay | Admitting: Cardiology

## 2014-09-19 ENCOUNTER — Encounter: Payer: Self-pay | Admitting: Physician Assistant

## 2014-09-20 NOTE — Telephone Encounter (Signed)
I don't see diabetes anywhere in his chart.

## 2014-09-29 ENCOUNTER — Other Ambulatory Visit: Payer: Self-pay | Admitting: Orthopedic Surgery

## 2014-10-19 ENCOUNTER — Ambulatory Visit (INDEPENDENT_AMBULATORY_CARE_PROVIDER_SITE_OTHER): Payer: 59 | Admitting: Family Medicine

## 2014-10-19 ENCOUNTER — Encounter: Payer: Self-pay | Admitting: Family Medicine

## 2014-10-19 VITALS — BP 105/69 | HR 66 | Temp 99.5°F | Wt 211.0 lb

## 2014-10-19 DIAGNOSIS — J209 Acute bronchitis, unspecified: Secondary | ICD-10-CM

## 2014-10-19 DIAGNOSIS — R05 Cough: Secondary | ICD-10-CM

## 2014-10-19 DIAGNOSIS — R059 Cough, unspecified: Secondary | ICD-10-CM

## 2014-10-19 MED ORDER — GUAIFENESIN-CODEINE 100-10 MG/5ML PO SOLN: 5.0000 mL | Freq: Every evening | ORAL | Status: DC | PRN

## 2014-10-19 MED ORDER — IPRATROPIUM BROMIDE 0.06 % NA SOLN: 2.0000 | Freq: Four times a day (QID) | NASAL | Status: DC

## 2014-10-19 MED ORDER — PREDNISONE 50 MG PO TABS: 50.0000 mg | ORAL_TABLET | Freq: Every day | ORAL | Status: DC

## 2014-10-19 MED ORDER — PROAIR RESPICLICK 108 (90 BASE) MCG/ACT IN AEPB: 1.0000 | INHALATION_SPRAY | RESPIRATORY_TRACT | Status: DC | PRN

## 2014-10-19 MED ORDER — IPRATROPIUM-ALBUTEROL 0.5-2.5 (3) MG/3ML IN SOLN
3.0000 mL | Freq: Once | RESPIRATORY_TRACT | Status: AC
  Administered 2014-10-19: 3 mL via RESPIRATORY_TRACT

## 2014-10-19 MED ORDER — AZITHROMYCIN 250 MG PO TABS: 250.0000 mg | ORAL_TABLET | Freq: Every day | ORAL | Status: DC

## 2014-10-19 NOTE — Progress Notes (Signed)
Stephen King is a 64 y.o. male who presents to [facility]  today for cough and wheezing. Patient is a 41 history of shortness of breath cough and wheezing. Cough is productive. He's tried some over-the-counter medicines which have helped a bit. No fevers chills vomiting or diarrhea. He feels well otherwise. He has a history of smoking quitting about 15 years ago. No history of asthma.   Past Medical History  Diagnosis Date  . Stroke 10/11  . Hernia, umbilical   . Hypertension    Past Surgical History  Procedure Laterality Date  . Cataracts    . Hemorroidectomy    . C6-7 fusion  10/2005  . Neck fusion     History  Substance Use Topics  . Smoking status: Former Smoker    Types: Cigarettes    Quit date: 03/25/1999  . Smokeless tobacco: Not on file  . Alcohol Use: No   ROS as above Medications: Current Outpatient Prescriptions  Medication Sig Dispense Refill  . acetaminophen (TYLENOL) 325 MG tablet Take 2 tablets (650 mg total) by mouth every 6 (six) hours as needed for mild pain (or Fever >/= 101).    . Diclofenac Sodium 2 % SOLN Place 2 sprays onto the skin 2 (two) times daily. 112 g 2  . fluocinonide cream (LIDEX) 3.29 % Apply 1 application topically 2 (two) times daily. 60 g 0  . HYDROcodone-acetaminophen (NORCO/VICODIN) 5-325 MG per tablet Take 1 tablet by mouth every 8 (eight) hours as needed for moderate pain. 30 tablet 0  . metoprolol tartrate (LOPRESSOR) 25 MG tablet Take 0.5 tablets (12.5 mg total) by mouth 2 (two) times daily. 90 tablet 5  . rivaroxaban (XARELTO) 20 MG TABS tablet Take 1 tablet (20 mg total) by mouth daily with supper. 30 tablet 0   No current facility-administered medications for this visit.   Allergies  Allergen Reactions  . Piroxicam      Exam:  BP 105/69 mmHg  Pulse 66  Temp(Src) 99.5 F (37.5 C) (Oral)  Wt 211 lb (95.709 kg)  SpO2 93% Gen: Well NAD HEENT: EOMI,  MMM Lungs: Normal work of breathing.  Wheezing and prolongation sore phase present bilaterally. Heart: RRR no MRG Abd: NABS, Soft. Nondistended, Nontender Exts: Brisk capillary refill, warm and well perfused. No leg swelling.  Patient received a 2.5/0.5 mg DuoNeb nebulizer treatment, and felt better. His lung exam improved  No results found for this or any previous visit (from the past 24 hour(s)). No results found.   Please see individual assessment and plan sections.

## 2014-10-19 NOTE — Assessment & Plan Note (Signed)
Treat with prednisone and albuterol azithromycin Atrovent nasal spray and codeine cough syrup. Dispense a respiclick trial inhaler.

## 2014-10-19 NOTE — Patient Instructions (Signed)
Thank you for coming in today. Call or go to the emergency room if you get worse, have trouble breathing, have chest pains, or palpitations.  Use the inhaler as needed.  Do not drive after taking the codeine.   Acute Bronchitis Bronchitis is inflammation of the airways that extend from the windpipe into the lungs (bronchi). The inflammation often causes mucus to develop. This leads to a cough, which is the most common symptom of bronchitis.  In acute bronchitis, the condition usually develops suddenly and goes away over time, usually in a couple weeks. Smoking, allergies, and asthma can make bronchitis worse. Repeated episodes of bronchitis may cause further lung problems.  CAUSES Acute bronchitis is most often caused by the same virus that causes a cold. The virus can spread from person to person (contagious) through coughing, sneezing, and touching contaminated objects. SIGNS AND SYMPTOMS   Cough.   Fever.   Coughing up mucus.   Body aches.   Chest congestion.   Chills.   Shortness of breath.   Sore throat.  DIAGNOSIS  Acute bronchitis is usually diagnosed through a physical exam. Your health care provider will also ask you questions about your medical history. Tests, such as chest X-rays, are sometimes done to rule out other conditions.  TREATMENT  Acute bronchitis usually goes away in a couple weeks. Oftentimes, no medical treatment is necessary. Medicines are sometimes given for relief of fever or cough. Antibiotic medicines are usually not needed but may be prescribed in certain situations. In some cases, an inhaler may be recommended to help reduce shortness of breath and control the cough. A cool mist vaporizer may also be used to help thin bronchial secretions and make it easier to clear the chest.  HOME CARE INSTRUCTIONS  Get plenty of rest.   Drink enough fluids to keep your urine clear or pale yellow (unless you have a medical condition that requires fluid  restriction). Increasing fluids may help thin your respiratory secretions (sputum) and reduce chest congestion, and it will prevent dehydration.   Take medicines only as directed by your health care provider.  If you were prescribed an antibiotic medicine, finish it all even if you start to feel better.  Avoid smoking and secondhand smoke. Exposure to cigarette smoke or irritating chemicals will make bronchitis worse. If you are a smoker, consider using nicotine gum or skin patches to help control withdrawal symptoms. Quitting smoking will help your lungs heal faster.   Reduce the chances of another bout of acute bronchitis by washing your hands frequently, avoiding people with cold symptoms, and trying not to touch your hands to your mouth, nose, or eyes.   Keep all follow-up visits as directed by your health care provider.  SEEK MEDICAL CARE IF: Your symptoms do not improve after 1 week of treatment.  SEEK IMMEDIATE MEDICAL CARE IF:  You develop an increased fever or chills.   You have chest pain.   You have severe shortness of breath.  You have bloody sputum.   You develop dehydration.  You faint or repeatedly feel like you are going to pass out.  You develop repeated vomiting.  You develop a severe headache. MAKE SURE YOU:   Understand these instructions.  Will watch your condition.  Will get help right away if you are not doing well or get worse. Document Released: 04/17/2004 Document Revised: 07/25/2013 Document Reviewed: 08/31/2012 [facility] Patient Information 2015 [place], [place]. This information is not intended to replace advice given to you by  your health care provider. Make sure you discuss any questions you have with your health care provider.  

## 2014-10-21 ENCOUNTER — Other Ambulatory Visit: Payer: Self-pay | Admitting: Cardiology

## 2014-10-23 ENCOUNTER — Other Ambulatory Visit: Payer: Self-pay

## 2014-10-23 NOTE — Telephone Encounter (Signed)
Approved      Disp Refills Start End    rivaroxaban (XARELTO) 20 MG TABS tablet 30 tablet 0 12/12/2013     Sig - Route:  Take 1 tablet (20 mg total) by mouth daily with supper. - Oral    Class:  Normal    DAW:  No    Comment:  Call [facility] with any problems. [phone] [place] [address]    Authorizing Provider:  Donella Stade, PA-C    Ordering User:  Narda Rutherford, [place]

## 2014-10-24 ENCOUNTER — Encounter (HOSPITAL_BASED_OUTPATIENT_CLINIC_OR_DEPARTMENT_OTHER): Payer: Self-pay | Admitting: *Deleted

## 2014-10-27 ENCOUNTER — Encounter: Payer: Self-pay | Admitting: Cardiology

## 2014-10-27 ENCOUNTER — Ambulatory Visit (INDEPENDENT_AMBULATORY_CARE_PROVIDER_SITE_OTHER): Payer: 59 | Admitting: Cardiology

## 2014-10-27 VITALS — BP 104/76 | HR 74 | Ht 71.0 in | Wt 212.0 lb

## 2014-10-27 DIAGNOSIS — Z8673 Personal history of transient ischemic attack (TIA), and cerebral infarction without residual deficits: Secondary | ICD-10-CM | POA: Diagnosis not present

## 2014-10-27 DIAGNOSIS — I480 Paroxysmal atrial fibrillation: Secondary | ICD-10-CM | POA: Diagnosis not present

## 2014-10-27 DIAGNOSIS — Z01810 Encounter for preprocedural cardiovascular examination: Secondary | ICD-10-CM | POA: Diagnosis not present

## 2014-10-27 NOTE — Progress Notes (Signed)
[facility] [address] [place], [place]  [address] Phone: [phone] Fax:  [phone]  Date:  10/27/2014   ID:  Stephen King, DOB February 19, 1951, MRN [id number]  PCP:  Iran Planas, PA-C   History of Present Illness: Stephen King is a 64 y.o. male here for evaluation of atrial fibrillation with rapid ventricular response following hospitalization on 08/22/13. Stroke in 2011 and presented with substernal chest pain radiating to his back lasting approximately 1-1/2 hours associated with shortness of breath lightheadedness, nausea rated as a 10 over 10 at its worst. Initial troponin was normal. He was placed on anticoagulation. Echocardiogram showed normal ejection fraction with no wall motion abnormalities. He was observed on telemetry and converted. 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  Just led a [employer] trip, boating in [place], 10 days no issues. No further CP.   He's had no alcohol use, no fevers, no surgeries, no thyroid issues. No amphetamines. He has had no further bouts of atrial fibrillation.  10/27/14 - Bronchitis post [place] trip. Awaiting shoulder surgery. Doing well. No episodes of A. fib. Blood pressure soft.  Wt Readings from Last 3 Encounters:  10/27/14 212 lb (96.163 kg)  10/19/14 211 lb (95.709 kg)  10/24/14 210 lb (95.255 kg)     Past Medical History  Diagnosis Date  . Stroke 10/11  . Hernia, umbilical   . Dysrhythmia     PAF  . Arthritis     Past Surgical History  Procedure Laterality Date  . Cataracts    . Hemorroidectomy    . C6-7 fusion  10/2005  . Neck fusion      Current Outpatient Prescriptions  Medication Sig Dispense Refill  . albuterol (PROVENTIL HFA;VENTOLIN HFA) 108 (90 BASE) MCG/ACT inhaler Inhale 1 puff into the lungs every 6 (six) hours as needed for wheezing or shortness of breath.    Marland Kitchen HYDROcodone-acetaminophen (NORCO/VICODIN) 5-325 MG per tablet Take 1 tablet by mouth every 8 (eight) hours as  needed for moderate pain. 30 tablet 0  . metoprolol tartrate (LOPRESSOR) 25 MG tablet Take 0.5 tablets (12.5 mg total) by mouth 2 (two) times daily. 90 tablet 5  . XARELTO 20 MG TABS tablet TAKE 1 TABLET BY MOUTH DAILY WITH SUPPER 30 tablet 5  . fluocinonide cream (LIDEX) 4.85 % Apply 1 application topically 2 (two) times daily. (Patient not taking: Reported on 10/27/2014) 60 g 0   No current facility-administered medications for this visit.    Allergies:    Allergies  Allergen Reactions  . Feldene [Piroxicam] Hives    Social History:  The patient  reports that he quit smoking about 15 years ago. His smoking use included Cigarettes. He does not have any smokeless tobacco history on file. He reports that he does not drink alcohol or use illicit drugs.   Family History  Problem Relation Age of Onset  . Alcohol abuse Mother   . Colon cancer Father   . Alcohol abuse Father   . Stroke Maternal Grandfather     ROS:  Please see the history of present illness.   Denies any bleeding, syncope, orthopnea, PND. 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 Just completed Iris study.   All other systems reviewed and negative.   PHYSICAL EXAM: VS:  BP 104/76 mmHg  Pulse 74  Ht 5\' 11"  (1.803 m)  Wt 212 lb (96.163 kg)  BMI 29.58 kg/m2 Well  nourished, well developed, in no acute distress HEENT: [place]/AT, EOMI, right parotid mass. Rosacea Neck: no JVD, normal carotid upstroke, no bruit Cardiac:  normal S1, S2; RRR; no murmur Lungs:  clear to auscultation bilaterally, no wheezing, rhonchi or rales Abd: soft, nontender, no hepatomegaly, no bruits Ext: no edema, 2+ distal pulses Skin: warm and dry GU: deferred Neuro: no focal abnormalities noted, AAO x 3  EKG:  04/04/14-sinus rhythm, 63, no other abnormalities-previous 08/21/13-sinus rhythm, no other abnormalities 08/20/13-atrial fibrillation heart rate 165 with nonspecific ST-T wave changes   ECHO: 08/21/13 - Left ventricle: The  cavity size was normal. There was mild concentric hypertrophy. Systolic function was normal. The estimated ejection fraction was in the range of 60% to 65%. Wall motion was normal; there were no regional wall motion abnormalities. - Mitral valve: There was systolic anterior motion. There was mild regurgitation. - Right ventricle: The cavity size was mildly dilated. Wall thickness was normal. - Right atrium: The atrium was mildly dilated.    Labs: 08/21/13-troponin normal, hemoglobin A1c 5.1, TSH 1.3, creatinine 0.9, LDL 67, hemoglobin 12.8  ASSESSMENT AND PLAN:  1. Preoperative risk stratification-overall doing well. No evidence of atrial fibrillation. Recently hiked in [place] without any chest discomfort or episodes. Recent bout of bronchitis, relieved. Overall doing well. He may proceed with shoulder surgery. Hold Xarelto for 2 days prior. Resume as soon as next day after surgery if possible. 2. Paroxysmal atrial fibrillation with rapid ventricular response - currently sinus rhythm. Low-dose metoprolol. Soft blood pressure at baseline. He's not had any symptoms. Continue with current medications. 3. Chest pain-no further episodes of chest pain. 4. Anticoagulation- Xarelto. CHADS-VASc (CVA-2) expressed the importance of anticoagulation. He understands. Continue. No signs of bleeding. Basic metabolic profile and CBC every 6 months. 5. Six-month followup  Signed, Candee Furbish, MD [facility]  10/27/2014 3:18 PM

## 2014-10-27 NOTE — Patient Instructions (Signed)
Medication Instructions:  Your physician recommends that you continue on your current medications as directed. Please refer to the Current Medication list given to you today. Ok to hold Xarelto Saturday and Sunday for surgery on Monday.  Resume as soon as possible after surgery.  Follow-Up: Follow up in 6 months with Dr. Marlou Porch.  You will receive a letter in the mail 2 months before you are due.  Please call us when you receive this letter to schedule your follow up appointment.  Thank you for choosing [facility]!!

## 2014-10-30 ENCOUNTER — Ambulatory Visit (HOSPITAL_BASED_OUTPATIENT_CLINIC_OR_DEPARTMENT_OTHER)
Admission: RE | Admit: 2014-10-30 | Discharge: 2014-10-30 | Disposition: A | Discharge status: Home / Self Care | Payer: 59 | Source: Ambulatory Visit | Attending: Orthopedic Surgery | Admitting: Orthopedic Surgery

## 2014-10-30 ENCOUNTER — Ambulatory Visit (HOSPITAL_BASED_OUTPATIENT_CLINIC_OR_DEPARTMENT_OTHER): Payer: 59 | Admitting: Certified Registered"

## 2014-10-30 ENCOUNTER — Encounter (HOSPITAL_BASED_OUTPATIENT_CLINIC_OR_DEPARTMENT_OTHER)
Admission: RE | Disposition: A | Discharge status: Home / Self Care | Payer: Self-pay | Source: Ambulatory Visit | Attending: Orthopedic Surgery

## 2014-10-30 ENCOUNTER — Encounter (HOSPITAL_BASED_OUTPATIENT_CLINIC_OR_DEPARTMENT_OTHER): Payer: Self-pay

## 2014-10-30 DIAGNOSIS — S43431A Superior glenoid labrum lesion of right shoulder, initial encounter: Secondary | ICD-10-CM | POA: Diagnosis not present

## 2014-10-30 DIAGNOSIS — M75111 Incomplete rotator cuff tear or rupture of right shoulder, not specified as traumatic: Secondary | ICD-10-CM | POA: Diagnosis not present

## 2014-10-30 DIAGNOSIS — I480 Paroxysmal atrial fibrillation: Secondary | ICD-10-CM | POA: Insufficient documentation

## 2014-10-30 DIAGNOSIS — M1990 Unspecified osteoarthritis, unspecified site: Secondary | ICD-10-CM | POA: Diagnosis not present

## 2014-10-30 DIAGNOSIS — Z79899 Other long term (current) drug therapy: Secondary | ICD-10-CM | POA: Diagnosis not present

## 2014-10-30 DIAGNOSIS — Z87891 Personal history of nicotine dependence: Secondary | ICD-10-CM | POA: Insufficient documentation

## 2014-10-30 DIAGNOSIS — M7541 Impingement syndrome of right shoulder: Secondary | ICD-10-CM | POA: Diagnosis not present

## 2014-10-30 DIAGNOSIS — X58XXXA Exposure to other specified factors, initial encounter: Secondary | ICD-10-CM | POA: Diagnosis not present

## 2014-10-30 DIAGNOSIS — Z8673 Personal history of transient ischemic attack (TIA), and cerebral infarction without residual deficits: Secondary | ICD-10-CM | POA: Insufficient documentation

## 2014-10-30 DIAGNOSIS — Z7901 Long term (current) use of anticoagulants: Secondary | ICD-10-CM | POA: Diagnosis not present

## 2014-10-30 HISTORY — DX: Unspecified osteoarthritis, unspecified site: M19.90

## 2014-10-30 HISTORY — PX: SHOULDER ARTHROSCOPY WITH SUBACROMIAL DECOMPRESSION: SHX5684

## 2014-10-30 HISTORY — PX: SHOULDER ARTHROSCOPY WITH ROTATOR CUFF REPAIR: SHX5685

## 2014-10-30 HISTORY — DX: Cardiac arrhythmia, unspecified: I49.9

## 2014-10-30 LAB — POCT HEMOGLOBIN-HEMACUE: Hemoglobin: 16.4 g/dL (ref 13.0–17.0)

## 2014-10-30 SURGERY — SHOULDER ARTHROSCOPY WITH SUBACROMIAL DECOMPRESSION
Anesthesia: General | Site: Shoulder | Laterality: Right

## 2014-10-30 MED ORDER — FENTANYL CITRATE (PF) 100 MCG/2ML IJ SOLN
50.0000 ug | INTRAMUSCULAR | Status: DC | PRN
  Administered 2014-10-30: 100 ug via INTRAVENOUS

## 2014-10-30 MED ORDER — EPHEDRINE SULFATE 50 MG/ML IJ SOLN
INTRAMUSCULAR | Status: DC | PRN
  Administered 2014-10-30: 5 mg via INTRAVENOUS
  Administered 2014-10-30 (×2): 10 mg via INTRAVENOUS

## 2014-10-30 MED ORDER — MIDAZOLAM HCL 2 MG/2ML IJ SOLN
INTRAMUSCULAR | Status: AC
  Filled 2014-10-30: qty 2

## 2014-10-30 MED ORDER — POVIDONE-IODINE 7.5 % EX SOLN: Freq: Once | CUTANEOUS | Status: DC

## 2014-10-30 MED ORDER — CEFAZOLIN SODIUM-DEXTROSE 2-3 GM-% IV SOLR
INTRAVENOUS | Status: AC
  Filled 2014-10-30: qty 50

## 2014-10-30 MED ORDER — PROPOFOL 10 MG/ML IV BOLUS
INTRAVENOUS | Status: AC
  Filled 2014-10-30: qty 20

## 2014-10-30 MED ORDER — PROPOFOL 10 MG/ML IV BOLUS
INTRAVENOUS | Status: DC | PRN
  Administered 2014-10-30: 150 mg via INTRAVENOUS

## 2014-10-30 MED ORDER — BUPIVACAINE HCL (PF) 0.5 % IJ SOLN
INTRAMUSCULAR | Status: AC
  Filled 2014-10-30: qty 30

## 2014-10-30 MED ORDER — LACTATED RINGERS IV SOLN
INTRAVENOUS | Status: DC
  Administered 2014-10-30 (×2): via INTRAVENOUS

## 2014-10-30 MED ORDER — ONDANSETRON HCL 4 MG/2ML IJ SOLN
INTRAMUSCULAR | Status: DC | PRN
Start: 2014-10-30 — End: 2014-10-30
  Administered 2014-10-30: 4 mg via INTRAVENOUS

## 2014-10-30 MED ORDER — LIDOCAINE HCL (CARDIAC) 20 MG/ML IV SOLN
INTRAVENOUS | Status: DC | PRN
  Administered 2014-10-30: 100 mg via INTRAVENOUS

## 2014-10-30 MED ORDER — CEFAZOLIN SODIUM-DEXTROSE 2-3 GM-% IV SOLR
2.0000 g | INTRAVENOUS | Status: AC
  Administered 2014-10-30: 2 g via INTRAVENOUS

## 2014-10-30 MED ORDER — MIDAZOLAM HCL 2 MG/2ML IJ SOLN
1.0000 mg | INTRAMUSCULAR | Status: DC | PRN
  Administered 2014-10-30: 2 mg via INTRAVENOUS

## 2014-10-30 MED ORDER — LIDOCAINE HCL 4 % MT SOLN
OROMUCOSAL | Status: DC | PRN
  Administered 2014-10-30: 3 mL via TOPICAL

## 2014-10-30 MED ORDER — DEXAMETHASONE SODIUM PHOSPHATE 4 MG/ML IJ SOLN
INTRAMUSCULAR | Status: DC | PRN
  Administered 2014-10-30: 10 mg via INTRAVENOUS

## 2014-10-30 MED ORDER — PROPOFOL 500 MG/50ML IV EMUL
INTRAVENOUS | Status: AC
  Filled 2014-10-30: qty 50

## 2014-10-30 MED ORDER — FENTANYL CITRATE (PF) 100 MCG/2ML IJ SOLN
INTRAMUSCULAR | Status: AC
  Filled 2014-10-30: qty 2

## 2014-10-30 MED ORDER — BUPIVACAINE-EPINEPHRINE (PF) 0.5% -1:200000 IJ SOLN
INTRAMUSCULAR | Status: DC | PRN
  Administered 2014-10-30: 30 mL via PERINEURAL

## 2014-10-30 MED ORDER — GLYCOPYRROLATE 0.2 MG/ML IJ SOLN: 0.2000 mg | Freq: Once | INTRAMUSCULAR | Status: DC | PRN

## 2014-10-30 MED ORDER — OXYCODONE-ACETAMINOPHEN 5-325 MG PO TABS: 1.0000 | ORAL_TABLET | ORAL | Status: DC | PRN

## 2014-10-30 MED ORDER — DOCUSATE SODIUM 100 MG PO CAPS: 100.0000 mg | ORAL_CAPSULE | Freq: Three times a day (TID) | ORAL | Status: DC | PRN

## 2014-10-30 MED ORDER — FENTANYL CITRATE (PF) 100 MCG/2ML IJ SOLN
INTRAMUSCULAR | Status: AC
  Filled 2014-10-30: qty 6

## 2014-10-30 MED ORDER — SCOPOLAMINE 1 MG/3DAYS TD PT72: 1.0000 | MEDICATED_PATCH | Freq: Once | TRANSDERMAL | Status: DC | PRN

## 2014-10-30 MED ORDER — SUCCINYLCHOLINE CHLORIDE 20 MG/ML IJ SOLN
INTRAMUSCULAR | Status: DC | PRN
  Administered 2014-10-30: 140 mg via INTRAVENOUS

## 2014-10-30 SURGICAL SUPPLY — 87 items
ANCH SUT SWLK 19.1X4.75 VT (Anchor) ×2 IMPLANT
ANCHOR PEEK 4.75X19.1 SWLK C (Anchor) ×4 IMPLANT
APL SKNCLS STERI-STRIP NONHPOA (GAUZE/BANDAGES/DRESSINGS)
BENZOIN TINCTURE PRP APPL 2/3 (GAUZE/BANDAGES/DRESSINGS) IMPLANT
BLADE CLIPPER SURG (BLADE) IMPLANT
BLADE SURG 15 STRL LF DISP TIS (BLADE) IMPLANT
BLADE SURG 15 STRL SS (BLADE)
BUR OVAL 4.0 (BURR) ×3 IMPLANT
CANNULA 5.75X71 LONG (CANNULA) ×3 IMPLANT
CANNULA TWIST IN 8.25X7CM (CANNULA) IMPLANT
CHLORAPREP W/TINT 26ML (MISCELLANEOUS) ×3 IMPLANT
CLOSURE WOUND 1/2 X4 (GAUZE/BANDAGES/DRESSINGS)
DECANTER SPIKE VIAL GLASS SM (MISCELLANEOUS) IMPLANT
DRAPE INCISE IOBAN 66X45 STRL (DRAPES) ×3 IMPLANT
DRAPE STERI 35X30 U-POUCH (DRAPES) ×3 IMPLANT
DRAPE SURG 17X23 STRL (DRAPES) ×3 IMPLANT
DRAPE U 20/CS (DRAPES) ×3 IMPLANT
DRAPE U-SHAPE 47X51 STRL (DRAPES) ×3 IMPLANT
DRAPE U-SHAPE 76X120 STRL (DRAPES) ×6 IMPLANT
DRSG PAD ABDOMINAL 8X10 ST (GAUZE/BANDAGES/DRESSINGS) ×3 IMPLANT
ELECT REM PT RETURN 9FT ADLT (ELECTROSURGICAL)
ELECTRODE REM PT RTRN 9FT ADLT (ELECTROSURGICAL) ×1 IMPLANT
GAUZE SPONGE 4X4 12PLY STRL (GAUZE/BANDAGES/DRESSINGS) ×3 IMPLANT
GAUZE SPONGE 4X4 16PLY XRAY LF (GAUZE/BANDAGES/DRESSINGS) ×2 IMPLANT
GAUZE XEROFORM 1X8 LF (GAUZE/BANDAGES/DRESSINGS) ×3 IMPLANT
GLOVE BIO SURGEON STRL SZ 6.5 (GLOVE) ×1 IMPLANT
GLOVE BIO SURGEON STRL SZ7 (GLOVE) ×3 IMPLANT
GLOVE BIO SURGEON STRL SZ7.5 (GLOVE) ×3 IMPLANT
GLOVE BIO SURGEONS STRL SZ 6.5 (GLOVE) ×1
GLOVE BIOGEL PI IND STRL 7.0 (GLOVE) ×1 IMPLANT
GLOVE BIOGEL PI IND STRL 8 (GLOVE) ×1 IMPLANT
GLOVE BIOGEL PI INDICATOR 7.0 (GLOVE) ×8
GLOVE BIOGEL PI INDICATOR 8 (GLOVE) ×2
GLOVE ECLIPSE 6.5 STRL STRAW (GLOVE) ×2 IMPLANT
GOWN STRL REUS W/ TWL LRG LVL3 (GOWN DISPOSABLE) ×2 IMPLANT
GOWN STRL REUS W/ TWL XL LVL3 (GOWN DISPOSABLE) ×1 IMPLANT
GOWN STRL REUS W/TWL LRG LVL3 (GOWN DISPOSABLE) ×6
GOWN STRL REUS W/TWL XL LVL3 (GOWN DISPOSABLE) ×9
LASSO CRESCENT QUICKPASS (SUTURE) IMPLANT
LIQUID BAND (GAUZE/BANDAGES/DRESSINGS) IMPLANT
MANIFOLD NEPTUNE II (INSTRUMENTS) ×3 IMPLANT
NDL 1/2 CIR CATGUT .05X1.09 (NEEDLE) IMPLANT
NDL SCORPION MULTI FIRE (NEEDLE) IMPLANT
NDL SUT 6 .5 CRC .975X.05 MAYO (NEEDLE) IMPLANT
NEEDLE 1/2 CIR CATGUT .05X1.09 (NEEDLE) IMPLANT
NEEDLE MAYO TAPER (NEEDLE)
NEEDLE SCORPION MULTI FIRE (NEEDLE) ×3 IMPLANT
NS IRRIG 1000ML POUR BTL (IV SOLUTION) IMPLANT
PACK ARTHROSCOPY DSU (CUSTOM PROCEDURE TRAY) ×3 IMPLANT
PACK BASIN DAY SURGERY FS (CUSTOM PROCEDURE TRAY) ×3 IMPLANT
PENCIL BUTTON HOLSTER BLD 10FT (ELECTRODE) IMPLANT
RESECTOR FULL RADIUS 4.2MM (BLADE) ×3 IMPLANT
SHEET MEDIUM DRAPE 40X70 STRL (DRAPES) IMPLANT
SLEEVE SCD COMPRESS KNEE MED (MISCELLANEOUS) ×3 IMPLANT
SLING ARM IMMOBILIZER MED (SOFTGOODS) IMPLANT
SLING ARM LRG ADULT FOAM STRAP (SOFTGOODS) IMPLANT
SLING ARM MED ADULT FOAM STRAP (SOFTGOODS) IMPLANT
SLING ARM XL FOAM STRAP (SOFTGOODS) IMPLANT
SPONGE LAP 4X18 X RAY DECT (DISPOSABLE) IMPLANT
STRIP CLOSURE SKIN 1/2X4 (GAUZE/BANDAGES/DRESSINGS) IMPLANT
SUCTION FRAZIER TIP 10 FR DISP (SUCTIONS) IMPLANT
SUPPORT WRAP ARM LG (MISCELLANEOUS) IMPLANT
SUT BONE WAX W31G (SUTURE) IMPLANT
SUT ETHIBOND 2 OS 4 DA (SUTURE) IMPLANT
SUT ETHILON 3 0 PS 1 (SUTURE) ×3 IMPLANT
SUT ETHILON 4 0 PS 2 18 (SUTURE) IMPLANT
SUT FIBERWIRE #2 38 T-5 BLUE (SUTURE)
SUT MNCRL AB 3-0 PS2 18 (SUTURE) IMPLANT
SUT MNCRL AB 4-0 PS2 18 (SUTURE) IMPLANT
SUT PDS AB 0 CT 36 (SUTURE) IMPLANT
SUT PROLENE 3 0 PS 2 (SUTURE) IMPLANT
SUT TIGER TAPE 7 IN WHITE (SUTURE) IMPLANT
SUT VIC AB 0 CT1 27 (SUTURE)
SUT VIC AB 0 CT1 27XBRD ANBCTR (SUTURE) IMPLANT
SUT VIC AB 2-0 SH 27 (SUTURE)
SUT VIC AB 2-0 SH 27XBRD (SUTURE) IMPLANT
SUTURE FIBERWR #2 38 T-5 BLUE (SUTURE) IMPLANT
SYR BULB 3OZ (MISCELLANEOUS) IMPLANT
TAPE FIBER 2MM 7IN #2 BLUE (SUTURE) IMPLANT
TOWEL OR 17X24 6PK STRL BLUE (TOWEL DISPOSABLE) ×3 IMPLANT
TOWEL OR NON WOVEN STRL DISP B (DISPOSABLE) ×3 IMPLANT
TUBE CONNECTING 20'X1/4 (TUBING) ×1
TUBE CONNECTING 20X1/4 (TUBING) ×2 IMPLANT
TUBING ARTHROSCOPY IRRIG 16FT (MISCELLANEOUS) ×3 IMPLANT
WAND STAR VAC 90 (SURGICAL WAND) ×3 IMPLANT
WATER STERILE IRR 1000ML POUR (IV SOLUTION) ×3 IMPLANT
YANKAUER SUCT BULB TIP NO VENT (SUCTIONS) IMPLANT

## 2014-10-30 NOTE — Discharge Instructions (Signed)
Discharge Instructions after Arthroscopic Shoulder Surgery   A sling has been provided for you. You may remove the sling after 72 hours. The sling may be worn for your protection, if you are in a crowd.  Use ice on the shoulder intermittently over the first 48 hours after surgery.  Pain medication has been prescribed for you.  Use your medication liberally over the first 48 hours, and then begin to taper your use. You may take Extra Strength Tylenol or Tylenol only in place of the pain pills. DO NOT take ANY nonsteroidal anti-inflammatory pain medications: Advil, Motrin, Ibuprofen, Aleve, Naproxen, or Naprosyn.  You may remove your dressing after two days.  You may shower 5 days after surgery. The incision CANNOT get wet prior to 5 days. Simply allow the water to wash over the site and then pat dry. Do not rub the incision. Make sure your axilla (armpit) is completely dry after showering.  Three to 5 times each day you should perform assisted overhead reaching and external rotation (outward turning) exercises with the operative arm. Both exercises should be done with the non-operative arm used as the "therapist arm" while the operative arm remains relaxed. Ten of each exercise should be done three to five times each day.    Overhead reach is helping to lift your stiff arm up as high as it will go. To stretch your overhead reach, lie flat on your back, relax, and grasp the wrist of the tight shoulder with your opposite hand. Using the power in your opposite arm, bring the stiff arm up as far as it is comfortable. Start holding it for ten seconds and then work up to where you can hold it for a count of 30. Breathe slowly and deeply while the arm is moved. Repeat this stretch ten times, trying to help the arm up a little higher each time.       External rotation is turning the arm out to the side while your elbow stays close to your body. External rotation is best stretched while you are lying on  your back. Hold a cane, yardstick, broom handle, or dowel in both hands. Bend both elbows to a right angle. Use steady, gentle force from your normal arm to rotate the hand of the stiff shoulder out away from your body. Continue the rotation as far as it will go comfortably, holding it there for a count of 10. Repeat this exercise ten times.     Please call [phone] during normal business hours or [phone] after hours for any problems. Including the following:  - excessive redness of the incisions - drainage for more than 4 days - fever of more than 101.5 F  *Please note that pain medications will not be refilled after hours or on weekends.  Regional Anesthesia Blocks  1. Numbness or the inability to move the "blocked" extremity may last from 3-48 hours after placement. The length of time depends on the medication injected and your individual response to the medication. If the numbness is not going away after 48 hours, call your surgeon.  2. The extremity that is blocked will need to be protected until the numbness is gone and the  Strength has returned. Because you cannot feel it, you will need to take extra care to avoid injury. Because it may be weak, you may have difficulty moving it or using it. You may not know what position it is in without looking at it while the block is in effect.  3. For blocks in the legs and feet, returning to weight bearing and walking needs to be done carefully. You will need to wait until the numbness is entirely gone and the strength has returned. You should be able to move your leg and foot normally before you try and bear weight or walk. You will need someone to be with you when you first try to ensure you do not fall and possibly risk injury.  4. Bruising and tenderness at the needle site are common side effects and will resolve in a few days.  5. Persistent numbness or new problems with movement should be communicated to the surgeon or the [facility] [phone] [place] [phone]).  Post Anesthesia Home Care Instructions  Activity: Get plenty of rest for the remainder of the day. A responsible adult should stay with you for 24 hours following the procedure.  For the next 24 hours, DO NOT: -Drive a car -Paediatric nurse -Drink alcoholic beverages -Take any medication unless instructed by your physician -Make any legal decisions or sign important papers.  Meals: Start with liquid foods such as gelatin or soup. Progress to regular foods as tolerated. Avoid greasy, spicy, heavy foods. If nausea and/or vomiting occur, drink only clear liquids until the nausea and/or vomiting subsides. Call your physician if vomiting continues.  Special Instructions/Symptoms: Your throat may feel dry or sore from the anesthesia or the breathing tube placed in your throat during surgery. If this causes discomfort, gargle with warm salt water. The discomfort should disappear within 24 hours.  If you had a scopolamine patch placed behind your ear for the management of post- operative nausea and/or vomiting:  1. The medication in the patch is effective for 72 hours, after which it should be removed.  Wrap patch in a tissue and discard in the trash. Wash hands thoroughly with soap and water. 2. You may remove the patch earlier than 72 hours if you experience unpleasant side effects which may include dry mouth, dizziness or visual disturbances. 3. Avoid touching the patch. Wash your hands with soap and water after contact with the patch.

## 2014-10-30 NOTE — Anesthesia Procedure Notes (Addendum)
Anesthesia Regional Block:  Interscalene brachial plexus block  Pre-Anesthetic Checklist: ,, timeout performed, Correct Patient, Correct Site, Correct Laterality, Correct Procedure, Correct Position, site marked, Risks and benefits discussed,  Surgical consent,  Pre-op evaluation,  At surgeon's request and post-op pain management  Laterality: Right  Prep: chloraprep       Needles:  Injection technique: Single-shot  Needle Type: Echogenic Stimulator Needle     Needle Length: 9cm 9 cm Needle Gauge: 21 and 21 G    Additional Needles:  Procedures: ultrasound guided (picture in chart) and nerve stimulator Interscalene brachial plexus block  Nerve Stimulator or Paresthesia:  Response: 0.4 mA,   Additional Responses:   Narrative:  Start time: 10/30/2014 10:55 AM End time: 10/30/2014 11:05 AM Injection made incrementally with aspirations every 5 mL. Anesthesiologist: Lillia Abed  Additional Notes: Monitors applied. Patient sedated. Sterile prep and drape,hand hygiene and sterile gloves were used. Relevant anatomy identified.Needle position confirmed.Local anesthetic injected incrementally after negative aspiration. Local anesthetic spread visualized around nerve(s). Vascular puncture avoided. No complications. Image printed for medical record.The patient tolerated the procedure well.        Procedure Name: Intubation Date/Time: 10/30/2014 11:39 AM Performed by: Maryella Shivers Pre-anesthesia Checklist: Patient identified, Emergency Drugs available, Suction available and Patient being monitored Patient Re-evaluated:Patient Re-evaluated prior to inductionOxygen Delivery Method: Circle System Utilized Preoxygenation: Pre-oxygenation with 100% oxygen Intubation Type: IV induction Ventilation: Mask ventilation without difficulty Laryngoscope Size: Mac and 3 Grade View: Grade II Tube type: Oral Tube size: 8.0 mm Number of attempts: 1 Airway Equipment and Method: Stylet and Oral  airway Placement Confirmation: ETT inserted through vocal cords under direct vision,  positive ETCO2 and breath sounds checked- equal and bilateral Secured at: 22 cm Tube secured with: Tape Dental Injury: Teeth and Oropharynx as per pre-operative assessment

## 2014-10-30 NOTE — H&P (Signed)
Stephen King is an 64 y.o. male.   Chief Complaint: R shoulder pain  HPI: R shoulder pain and weakness with high grade partial RCT, failed conservative treatment.  Past Medical History  Diagnosis Date  . Stroke 10/11  . Hernia, umbilical   . Dysrhythmia     PAF  . Arthritis     Past Surgical History  Procedure Laterality Date  . Cataracts    . Hemorroidectomy    . C6-7 fusion  10/2005  . Neck fusion      Family History  Problem Relation Age of Onset  . Alcohol abuse Mother   . Colon cancer Father   . Alcohol abuse Father   . Stroke Maternal Grandfather    Social History:  reports that he quit smoking about 15 years ago. His smoking use included Cigarettes. He does not have any smokeless tobacco history on file. He reports that he does not drink alcohol or use illicit drugs.  Allergies:  Allergies  Allergen Reactions  . Feldene [Piroxicam] Hives    Medications Prior to Admission  Medication Sig Dispense Refill  . albuterol (PROVENTIL HFA;VENTOLIN HFA) 108 (90 BASE) MCG/ACT inhaler Inhale 1 puff into the lungs every 6 (six) hours as needed for wheezing or shortness of breath.    Marland Kitchen HYDROcodone-acetaminophen (NORCO/VICODIN) 5-325 MG per tablet Take 1 tablet by mouth every 8 (eight) hours as needed for moderate pain. 30 tablet 0  . metoprolol tartrate (LOPRESSOR) 25 MG tablet Take 0.5 tablets (12.5 mg total) by mouth 2 (two) times daily. 90 tablet 5  . XARELTO 20 MG TABS tablet TAKE 1 TABLET BY MOUTH DAILY WITH SUPPER 30 tablet 5  . fluocinonide cream (LIDEX) 7.86 % Apply 1 application topically 2 (two) times daily. (Patient not taking: Reported on 10/27/2014) 60 g 0    Results for orders placed or performed during the hospital encounter of 10/30/14 (from the past 48 hour(s))  Hemoglobin-hemacue, POC     Status: None   Collection Time: 10/30/14 10:43 AM  Result Value Ref Range   Hemoglobin 16.4 13.0 - 17.0 g/dL   No results found.  Review of Systems  All other systems  reviewed and are negative.   Blood pressure 117/74, pulse 58, temperature 97.6 F (36.4 C), temperature source Oral, resp. rate 18, height 5\' 11"  (1.803 m), weight 94.065 kg (207 lb 6 oz), SpO2 100 %. Physical Exam  Constitutional: He is oriented to person, place, and time. He appears well-developed and well-nourished.  HENT:  Head: Atraumatic.  Eyes: EOM are normal.  Cardiovascular: Intact distal pulses.   Respiratory: Effort normal.  Musculoskeletal:  R shoulder pain and weakness with RC testing.  Neurological: He is alert and oriented to person, place, and time.  Skin: Skin is warm and dry.  Psychiatric: He has a normal mood and affect.     Assessment/Plan R shoulder high grade RCT, impingement. Plan R arth RCR, SAD Risks / benefits of surgery discussed Consent on chart  NPO for OR Preop antibiotics   Thomasina Housley WILLIAM 10/30/2014, 11:04 AM

## 2014-10-30 NOTE — Anesthesia Postprocedure Evaluation (Signed)
Anesthesia Post Note  Patient: Stephen King  Procedure(s) Performed: Procedure(s) (LRB): Right shoulder arthrscopy rotator cuff repair, subacromial decompression (Right)  Anesthesia type: general  Patient location: PACU  Post pain: Pain level controlled  Post assessment: Patient's Cardiovascular Status Stable  Last Vitals:  Filed Vitals:   10/30/14 1338  BP:   Pulse: 65  Temp:   Resp: 19    Post vital signs: Reviewed and stable  Level of consciousness: sedated  Complications: No apparent anesthesia complications

## 2014-10-30 NOTE — Progress Notes (Signed)
Assisted Dr. Ossey with right, ultrasound guided, interscalene  block. Side rails up, monitors on throughout procedure. See vital signs in flow sheet. Tolerated Procedure well. 

## 2014-10-30 NOTE — Op Note (Signed)
Procedure(s): Right shoulder arthrscopy rotator cuff repair, subacromial decompression Procedure Note  Stephen King male 64 y.o. 10/30/2014  Procedure(s) and Anesthesia Type:    #1 right shoulder arthroscopic rotator cuff repair #2 right shoulder arthroscopic subacromial decompression #3 right shoulder arthroscopic debridement type I SLAP tear and anterior labral tear  Surgeon(s) and Role:    * Tania Ade, MD - Primary     Surgeon: Nita Sells   Assistants: Jeanmarie Hubert PA-C (Danielle was present and scrubbed throughout the procedure and was essential in positioning, assisting with the camera and instrumentation,, and closure)  Anesthesia: General endotracheal anesthesiaWith preoperative interscalene block given by the attending anesthesiologist     Procedure Detail    Estimated Blood Loss: Min         Drains: none  Blood Given: none         Specimens: none        Complications:  * No complications entered in OR log *         Disposition: PACU - hemodynamically stable.         Condition: stable    Procedure:   INDICATIONS FOR SURGERY: The patient is 64 y.o. male who Has had over one year history of right shoulder pain which has been refractory to nonoperative management. He was found on MRI to have high-grade and likely small full-thickness anterior supraspinatus tear. He was indicated for surgical treatment to try and decrease pain and restore function. He understood risks benefits alternatives to the procedure and wished to go forward with surgery.  OPERATIVE FINDINGS: Examination under anesthesia: No stiffness or instability. Diagnostic Arthroscopy:    DESCRIPTION OF PROCEDURE: The patient was identified in preoperative  holding area where I personally marked the operative site after  verifying site, side, and procedure with the patient. An interscalene block was given by the attending anesthesiologist the holding area.  The patient  was taken back to the operating room where general anesthesia was induced without complication and was placed in the beach-chair position with the back  elevated about 60 degrees and all extremities and head and neck carefully padded and  positioned.   The right upper extremity was then prepped and  draped in a standard sterile fashion. The appropriate time-out  procedure was carried out. The patient did receive IV antibiotics  within 30 minutes of incision.   A small posterior portal incision was made and the arthroscope was introduced into the joint. An anterior portal was then established above the subscapularis using needle localization. Small cannula was placed anteriorly. Diagnostic arthroscopy was then carried out.  He was noted to have intact subscapularis. Glenohumeral joint surfaces were intact without significant chondromalacia. He was noted to have degenerative tearing of the superior labrum with subluxation into the joint. There was also a large anterior labral flap. These were debrided with shaver back to healthy bleeding labrum. There is no significant detachment of the biceps origin. The biceps tendon was pulled into the joint and found to be intact. He was noted to have significant fraying of the articular side of the supraspinatus which was gently debrided. It was noted at this point that he did have a small 1 cm tear of the anterior supraspinatus which penetrated through the entire thickness of the tendon into the subacromial space. This was debrided with a shaver.  The arthroscope was then introduced into the subacromial space a standard lateral portal was established with needle localization. The shaver was used through the lateral portal  to perform extensive bursectomy. Coracoacromial ligament was examined and found to be Frayed indicating chronic impingement.  The bursal side of the rotator cuff tear was identified. The shaver was used to debride the tear edge and the  tuberosity. A bur was then used to debride the tuberosity down to bleeding bone and remove a small excrescence of bone superiorly. The camera was moved to a posterior lateral viewing portal and the repair was carried out with 1 4.75 swivel lock anchor centered in the width of the tear mediallyJust off the articular margin. The 2 suture strands of fiber tape and 2 strands of FiberWire were then passed evenly throughout the tear and brought over to an additional 4.75 swivel lock anchor and lateral row bringing the tendon nicely down over the prepared tuberosity.This created A nice watertight repair.  The coracoacromial ligament was taken down off the anterior acromion with the ArthroCare exposing a Moderate hooked anterior acromial spur. A high-speed bur was then used through the lateral portal to take down the anterior acromial spur from lateral to medial in a standard acromioplasty.  The acromioplasty was also viewed from the lateral portal and the bur was used as necessary to ensure that the acromion was completely flat from posterior to anterior.  The arthroscopic equipment was removed from the joint and the portals were closed with 3-0 nylon in an interrupted fashion. Sterile dressings were then applied including Xeroform 4 x 4's ABDs and tape. The patient was then allowed to awaken from general anesthesia, placed in a sling, transferred to the stretcher and taken to the recovery room in stable condition.   POSTOPERATIVE PLAN: The patient will be discharged home today and will followup in one week for suture removal and wound check.  He will follow the standard cuff protocol.

## 2014-10-30 NOTE — Anesthesia Preprocedure Evaluation (Signed)
Anesthesia Evaluation  Patient identified by MRN, date of birth, ID band Patient awake    Reviewed: Allergy & Precautions, NPO status , Patient's Chart, lab work & pertinent test results  Airway Mallampati: I  TM Distance: >3 FB Neck ROM: Full    Dental   Pulmonary former smoker,    Pulmonary exam normal       Cardiovascular Normal cardiovascular exam    Neuro/Psych    GI/Hepatic   Endo/Other    Renal/GU      Musculoskeletal   Abdominal   Peds  Hematology   Anesthesia Other Findings   Reproductive/Obstetrics                             Anesthesia Physical Anesthesia Plan  ASA: II  Anesthesia Plan: General   Post-op Pain Management: GA combined w/ Regional for post-op pain   Induction: Intravenous  Airway Management Planned: Oral ETT  Additional Equipment:   Intra-op Plan:   Post-operative Plan: Extubation in OR  Informed Consent: I have reviewed the patients History and Physical, chart, labs and discussed the procedure including the risks, benefits and alternatives for the proposed anesthesia with the patient or authorized representative who has indicated his/her understanding and acceptance.     Plan Discussed with: CRNA and Surgeon  Anesthesia Plan Comments:         Anesthesia Quick Evaluation

## 2014-10-30 NOTE — Transfer of Care (Signed)
Immediate Anesthesia Transfer of Care Note  Patient: Stephen King  Procedure(s) Performed: Procedure(s) with comments: Right shoulder arthrscopy rotator cuff repair, subacromial decompression (Right) - Right shoulder arthrscopy rotator cuff repair, subacromial decompression  Patient Location: PACU  Anesthesia Type:GA combined with regional for post-op pain  Level of Consciousness: sedated  Airway & Oxygen Therapy: Patient Spontanous Breathing and Patient connected to face mask oxygen  Post-op Assessment: Report given to RN and Post -op Vital signs reviewed and stable  Post vital signs: Reviewed and stable  Last Vitals:  Filed Vitals:   10/30/14 1248  BP:   Pulse: 67  Temp: 36.9 C  Resp: 17    Complications: No apparent anesthesia complications

## 2014-10-31 ENCOUNTER — Encounter (HOSPITAL_BASED_OUTPATIENT_CLINIC_OR_DEPARTMENT_OTHER): Payer: Self-pay | Admitting: Orthopedic Surgery

## 2014-11-01 ENCOUNTER — Encounter (HOSPITAL_BASED_OUTPATIENT_CLINIC_OR_DEPARTMENT_OTHER): Payer: Self-pay | Admitting: Orthopedic Surgery

## 2014-11-16 ENCOUNTER — Telehealth: Payer: Self-pay

## 2014-11-16 NOTE — Telephone Encounter (Signed)
OK to refill xarelto Stephen Furbish, MD'

## 2014-11-16 NOTE — Telephone Encounter (Signed)
Approved      Disp Refills Start End    XARELTO 20 MG TABS tablet 30 tablet 5 10/24/2014     Sig:  TAKE 1 TABLET BY MOUTH DAILY WITH SUPPER    Class:  Normal    DAW:  No    Authorizing Provider:  Jerline Pain, MD    Ordering User:  Brett Canales, LPN

## 2014-11-17 NOTE — Telephone Encounter (Signed)
OK to fill under Dr Marlou Porch as long as pt is keeping appts as scheduled.

## 2014-11-24 ENCOUNTER — Other Ambulatory Visit: Payer: Self-pay | Admitting: Cardiology

## 2014-11-24 MED ORDER — RIVAROXABAN 20 MG PO TABS: ORAL_TABLET | ORAL | Status: DC

## 2015-03-20 ENCOUNTER — Other Ambulatory Visit: Payer: Self-pay | Admitting: Cardiology

## 2015-04-26 ENCOUNTER — Encounter: Payer: Self-pay | Admitting: Cardiology

## 2015-04-26 ENCOUNTER — Ambulatory Visit (INDEPENDENT_AMBULATORY_CARE_PROVIDER_SITE_OTHER): Payer: 59 | Admitting: Cardiology

## 2015-04-26 VITALS — BP 116/82 | HR 68 | Ht 70.5 in | Wt 221.2 lb

## 2015-04-26 DIAGNOSIS — Z7901 Long term (current) use of anticoagulants: Secondary | ICD-10-CM | POA: Diagnosis not present

## 2015-04-26 DIAGNOSIS — I480 Paroxysmal atrial fibrillation: Secondary | ICD-10-CM

## 2015-04-26 MED ORDER — METOPROLOL TARTRATE 25 MG PO TABS: 12.5000 mg | ORAL_TABLET | ORAL | Status: DC | PRN

## 2015-04-26 MED ORDER — RIVAROXABAN 20 MG PO TABS: 20.0000 mg | ORAL_TABLET | Freq: Every day | ORAL | Status: DC

## 2015-04-26 NOTE — Patient Instructions (Signed)
Medication Instructions:  Please use Metoprolol on an as needed basis. Continue all other medications as listed.  Follow-Up: Follow up in 6 months with Dr. Marlou Porch.  You will receive a letter in the mail 2 months before you are due.  Please call us when you receive this letter to schedule your follow up appointment.  If you need a refill on your cardiac medications before your next appointment, please call your pharmacy.  Thank you for choosing [facility]!!

## 2015-04-26 NOTE — Progress Notes (Signed)
[facility]. [address], [address], [place]  [address] Phone: [phone] Fax:  [phone]  Date:  04/26/2015   ID:  DEONDREA King, DOB Mar 03, 1951, MRN [id number]  PCP:  Iran Planas, PA-C   History of Present Illness: Stephen King is a 65 y.o. male here for evaluation of atrial fibrillation with rapid ventricular response following hospitalization on 08/22/13. Stroke in 2011 and presented with substernal chest pain radiating to his back lasting approximately 1-1/2 hours associated with shortness of breath lightheadedness, nausea rated as a 10 over 10 at its worst. Initial troponin was normal. He was placed on anticoagulation. Echocardiogram showed normal ejection fraction with no wall motion abnormalities. He was observed on telemetry and converted. 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  Just led a [employer] trip, boating in [place], 10 days no issues. No further CP.   He's had no alcohol use, no fevers, no surgeries, no thyroid issues. No amphetamines. He has had no further bouts of atrial fibrillation.  10/27/14 - Bronchitis post [place] trip. Awaiting shoulder surgery. Doing well. No episodes of A. fib. Blood pressure soft.  04/26/15-still having some shoulder pain but he is off of pain medication. No chest pain. Had episodes of lightheadedness, tunnel vision. September atrial fibrillation episode postoperatively. Denies any bleeding, syncope, orthopnea, PND  Wt Readings from Last 3 Encounters:  04/26/15 221 lb 3.2 oz (100.336 kg)  10/30/14 207 lb 6 oz (94.065 kg)  10/27/14 212 lb (96.163 kg)     Past Medical History  Diagnosis Date  . Stroke ([facility]) 10/11  . Hernia, umbilical   . Dysrhythmia     PAF  . Arthritis     Past Surgical History  Procedure Laterality Date  . Cataracts    . Hemorroidectomy    . C6-7 fusion  10/2005  . Neck fusion    . Shoulder arthroscopy with subacromial decompression Right 10/30/2014    Procedure: Right  shoulder arthroscopy rotator cuff repair, subacromial decompression;  Surgeon: Tania Ade, MD;  Location: [facility];  Service: Orthopedics;  Laterality: Right;  Right shoulder arthrscopy rotator cuff repair, subacromial decompression  . Shoulder arthroscopy with rotator cuff repair Right 10/30/2014    Procedure: SHOULDER ARTHROSCOPY WITH ROTATOR CUFF REPAIR;  Surgeon: Tania Ade, MD;  Location: [facility];  Service: Orthopedics;  Laterality: Right;    Current Outpatient Prescriptions  Medication Sig Dispense Refill  . albuterol (PROVENTIL HFA;VENTOLIN HFA) 108 (90 BASE) MCG/ACT inhaler Inhale 1 puff into the lungs every 6 (six) hours as needed for wheezing or shortness of breath.    . fluocinonide cream (LIDEX) [id number] % Apply 1 application topically 2 (two) times daily. 60 g 0  . metoprolol tartrate (LOPRESSOR) 25 MG tablet Take 0.5 tablets (12.5 mg total) by mouth 2 (two) times daily. 90 tablet 5  . XARELTO 20 MG TABS tablet Take 1 tablet by mouth  daily with supper 90 tablet 2   No current facility-administered medications for this visit.    Allergies:    Allergies  Allergen Reactions  . Feldene [Piroxicam] Hives    Social History:  The patient  reports that he quit smoking about 16 years ago. His smoking use included Cigarettes. He does not have any smokeless tobacco history on file. He reports that he does not drink alcohol or use illicit drugs.   Family History  Problem Relation Age of Onset  .  Alcohol abuse Mother   . Colon cancer Father   . Alcohol abuse Father   . Stroke Maternal Grandfather     ROS:  Please see the history of present illness.   Denies any bleeding, syncope, orthopnea, PND. 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 Just completed Iris study.   All other systems reviewed and negative.   PHYSICAL EXAM: VS:  BP 116/82 mmHg  Pulse 68  Ht 5' 10.5" (1.791 m)  Wt 221 lb 3.2 oz (100.336 kg)  BMI 31.28  kg/m2 Well nourished, well developed, in no acute distress HEENT: [place]/AT, EOMI, right parotid mass. Rosacea Neck: no JVD, normal carotid upstroke, no bruit Cardiac:  normal S1, S2; RRR; no murmur Lungs:  clear to auscultation bilaterally, no wheezing, rhonchi or rales Abd: soft, nontender, no hepatomegaly, no bruits Ext: no edema, 2+ distal pulses Skin: warm and dry GU: deferred Neuro: no focal abnormalities noted, AAO x 3  EKG:  04/04/14-sinus rhythm, 63, no other abnormalities-previous 08/21/13-sinus rhythm, no other abnormalities 08/20/13-atrial fibrillation heart rate 165 with nonspecific ST-T wave changes   ECHO: 08/21/13 - Left ventricle: The cavity size was normal. There was mild concentric hypertrophy. Systolic function was normal. The estimated ejection fraction was in the range of 60% to 65%. Wall motion was normal; there were no regional wall motion abnormalities. - Mitral valve: There was systolic anterior motion. There was mild regurgitation. - Right ventricle: The cavity size was mildly dilated. Wall thickness was normal. - Right atrium: The atrium was mildly dilated.    Labs: 08/21/13-troponin normal, hemoglobin A1c 5.1, TSH 1.3, creatinine 0.9, LDL 67, hemoglobin 12.8  ASSESSMENT AND PLAN:   1. Paroxysmal atrial fibrillation with rapid ventricular response - currently sinus rhythm. Low-dose metoprolol, when necessary. Soft blood pressure at baseline, was having some symptoms of tunnel vision, likely hypotension periodically. Hydrate.. In September had an episode of what he thinks was atrial fibrillation again postoperatively. Took an extra metoprolol, lay down and felt better.  2. Anticoagulation- Xarelto. CHADS-VASc (CVA-2) expressed the importance of anticoagulation. He understands. Continue. No signs of bleeding. Basic metabolic profile and CBC every 6 months. Recent lab work normal. 3. Six-month followup  Signed, Candee Furbish, MD [facility]  04/26/2015 2:54 PM

## 2015-10-23 ENCOUNTER — Ambulatory Visit (INDEPENDENT_AMBULATORY_CARE_PROVIDER_SITE_OTHER): Payer: 59 | Admitting: Cardiology

## 2015-10-23 ENCOUNTER — Encounter: Payer: Self-pay | Admitting: Cardiology

## 2015-10-23 VITALS — BP 122/80 | HR 78 | Ht 70.0 in | Wt 222.6 lb

## 2015-10-23 DIAGNOSIS — Z7901 Long term (current) use of anticoagulants: Secondary | ICD-10-CM

## 2015-10-23 DIAGNOSIS — Z8673 Personal history of transient ischemic attack (TIA), and cerebral infarction without residual deficits: Secondary | ICD-10-CM | POA: Diagnosis not present

## 2015-10-23 DIAGNOSIS — I950 Idiopathic hypotension: Secondary | ICD-10-CM | POA: Diagnosis not present

## 2015-10-23 DIAGNOSIS — I480 Paroxysmal atrial fibrillation: Secondary | ICD-10-CM

## 2015-10-23 LAB — CBC
HEMATOCRIT: 44 % (ref 38.5–50.0)
HEMOGLOBIN: 15.1 g/dL (ref 13.2–17.1)
MCH: 32 pg (ref 27.0–33.0)
MCHC: 34.3 g/dL (ref 32.0–36.0)
MCV: 93.2 fL (ref 80.0–100.0)
MPV: 10.3 fL (ref 7.5–12.5)
PLATELETS: 276 10*3/uL (ref 140–400)
RBC: 4.72 MIL/uL (ref 4.20–5.80)
RDW: 13.4 % (ref 11.0–15.0)
WBC: 7.6 10*3/uL (ref 3.8–10.8)

## 2015-10-23 NOTE — Patient Instructions (Signed)
Medication Instructions:  The current medical regimen is effective;  continue present plan and medications.  Labwork: Please have blood work today (CBC, BMP)  Follow-Up: Follow up in 6 months with Dr. Skains.  You will receive a letter in the mail 2 months before you are due.  Please call us when you receive this letter to schedule your follow up appointment.  If you need a refill on your cardiac medications before your next appointment, please call your pharmacy.  Thank you for choosing [facility]!!     

## 2015-10-23 NOTE — Progress Notes (Signed)
[facility]. [address], [address], [place]  [address] Phone: [phone] Fax:  [phone]  Date:  10/23/2015   ID:  Stephen King, DOB 03/05/1951, MRN [id number]  PCP:  Iran Planas, PA-C   History of Present Illness: Stephen King is a 65 y.o. male here for evaluation of atrial fibrillation with rapid ventricular response following hospitalization on 08/22/13. Stroke in 2011 and presented with substernal chest pain radiating to his back lasting approximately 1-1/2 hours associated with shortness of breath lightheadedness, nausea rated as a 10 over 10 at its worst. Initial troponin was normal. He was placed on anticoagulation. Echocardiogram showed normal ejection fraction with no wall motion abnormalities. He was observed on telemetry and converted. 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 No further CP. Also recently went to [place], cruise.  He's had no alcohol use, no fevers, no surgeries, no thyroid issues. No amphetamines. He has rare bouts of atrial fibrillation.  Rare bouts of dizziness, hypotension. Blood pressure has been soft in the past. He is on no antihypertensives.    Wt Readings from Last 3 Encounters:  10/23/15 222 lb 9.6 oz (101 kg)  04/26/15 221 lb 3.2 oz (100.3 kg)  10/30/14 207 lb 6 oz (94.1 kg)     Past Medical History:  Diagnosis Date  . Arthritis   . Dysrhythmia    PAF  . Hernia, umbilical   . Stroke ([facility]) 10/11    Past Surgical History:  Procedure Laterality Date  . c6-7 fusion  10/2005  . cataracts    . HEMORROIDECTOMY    . neck fusion    . SHOULDER ARTHROSCOPY WITH ROTATOR CUFF REPAIR Right 10/30/2014   Procedure: SHOULDER ARTHROSCOPY WITH ROTATOR CUFF REPAIR;  Surgeon: Tania Ade, MD;  Location: [facility];  Service: Orthopedics;  Laterality: Right;  . SHOULDER ARTHROSCOPY WITH SUBACROMIAL DECOMPRESSION Right 10/30/2014   Procedure: Right shoulder arthroscopy rotator cuff repair, subacromial decompression;  Surgeon: Tania Ade, MD;  Location: [facility];  Service: Orthopedics;  Laterality: Right;  Right shoulder arthrscopy rotator cuff repair, subacromial decompression    Current Outpatient Prescriptions  Medication Sig Dispense Refill  . albuterol (PROVENTIL HFA;VENTOLIN HFA) 108 (90 BASE) MCG/ACT inhaler Inhale 1 puff into the lungs every 6 (six) hours as needed for wheezing or shortness of breath.    . fluocinonide cream (LIDEX) [id number] % Apply 1 application topically 2 (two) times daily. 60 g 0  . rivaroxaban (XARELTO) 20 MG TABS tablet Take 1 tablet (20 mg total) by mouth daily with supper. 90 tablet 3   No current facility-administered medications for this visit.     Allergies:    Allergies  Allergen Reactions  . Feldene [Piroxicam] Hives    Social History:  The patient  reports that he quit smoking about 16 years ago. His smoking use included Cigarettes. He does not have any smokeless tobacco history on file. He reports that he does not drink alcohol or use drugs.   Family History  Problem Relation Age of Onset  . Alcohol abuse Mother   . Colon cancer Father   . Alcohol abuse Father   . Stroke Maternal Grandfather     ROS:  Please see the history of present illness.   Denies any bleeding, syncope, orthopnea, PND. 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 Just  completed Iris study.   All other systems reviewed and negative.   PHYSICAL EXAM: VS:  BP 122/80   Pulse 78   Ht 5\' 10"  (1.778 m)   Wt 222 lb 9.6 oz (101 kg)   BMI 31.94 kg/m  Well nourished, well developed, in no acute distress HEENT: [place]/AT, EOMI, right parotid mass. Rosacea Neck: no JVD, normal carotid upstroke, no bruit Cardiac:  normal S1, S2; RRR; no murmur Lungs:  clear to auscultation bilaterally, no wheezing, rhonchi or rales Abd: soft, nontender, no hepatomegaly, no  bruitsoverweight Ext: no edema, 2+ distal pulses Skin: warm and dry GU: deferred Neuro: no focal abnormalities noted, AAO x 3  EKG:  EKG ordered today-10/23/15-sinus rhythm, sinus arrhythmia heart rate 70 bpm, no other abnormalities. 04/04/14-sinus rhythm, 63, no other abnormalities-previous 08/21/13-sinus rhythm, no other abnormalities 08/20/13-atrial fibrillation heart rate 165 with nonspecific ST-T wave changes   ECHO: 08/21/13 - Left ventricle: The cavity size was normal. There was mild concentric hypertrophy. Systolic function was normal. The estimated ejection fraction was in the range of 60% to 65%. Wall motion was normal; there were no regional wall motion abnormalities. - Mitral valve: There was systolic anterior motion. There was mild regurgitation. - Right ventricle: The cavity size was mildly dilated. Wall thickness was normal. - Right atrium: The atrium was mildly dilated.    Labs: 08/21/13-troponin normal, hemoglobin A1c 5.1, TSH 1.3, creatinine 0.9, LDL 67, hemoglobin 12.8  ASSESSMENT AND PLAN:   1. Paroxysmal atrial fibrillation with rapid ventricular response - currently sinus rhythm. Low-dose metoprolol, when necessary. Soft blood pressure at baseline, was having some symptoms of tunnel vision, likely hypotension periodically. Hydrate.. In September had an episode of what he thinks was atrial fibrillation again postoperatively. Took an extra metoprolol, lay down and felt better. I also encouraged him to liberalize salt intake and fluid intake to see if this helps him with his hypotension, occasional dizziness. 2. Anticoagulation- Xarelto. CHADS-VASc (CVA-2) expressed the importance of anticoagulation. He understands. Continue. No signs of bleeding. Basic metabolic profile and CBC every 6 months. Previously unremarkable 3. Six-month followup  Signed, Candee Furbish, MD [facility]  10/23/2015 4:07 PM

## 2015-10-24 LAB — BASIC METABOLIC PANEL
BUN: 11 mg/dL (ref 7–25)
CHLORIDE: 105 mmol/L (ref 98–110)
CO2: 26 mmol/L (ref 20–31)
Calcium: 8.9 mg/dL (ref 8.6–10.3)
Creat: 0.99 mg/dL (ref 0.70–1.25)
Glucose, Bld: 88 mg/dL (ref 65–99)
Potassium: 4 mmol/L (ref 3.5–5.3)
Sodium: 141 mmol/L (ref 135–146)

## 2015-10-26 ENCOUNTER — Ambulatory Visit (INDEPENDENT_AMBULATORY_CARE_PROVIDER_SITE_OTHER): Payer: 59 | Admitting: Physician Assistant

## 2015-10-26 ENCOUNTER — Encounter: Payer: Self-pay | Admitting: Physician Assistant

## 2015-10-26 ENCOUNTER — Other Ambulatory Visit: Payer: Self-pay | Admitting: *Deleted

## 2015-10-26 ENCOUNTER — Ambulatory Visit (INDEPENDENT_AMBULATORY_CARE_PROVIDER_SITE_OTHER): Payer: 59

## 2015-10-26 VITALS — BP 119/65 | HR 68 | Ht 70.0 in | Wt 220.0 lb

## 2015-10-26 DIAGNOSIS — Z13220 Encounter for screening for lipoid disorders: Secondary | ICD-10-CM | POA: Diagnosis not present

## 2015-10-26 DIAGNOSIS — M25562 Pain in left knee: Secondary | ICD-10-CM | POA: Diagnosis not present

## 2015-10-26 DIAGNOSIS — M1712 Unilateral primary osteoarthritis, left knee: Secondary | ICD-10-CM | POA: Insufficient documentation

## 2015-10-26 DIAGNOSIS — M25561 Pain in right knee: Secondary | ICD-10-CM | POA: Diagnosis not present

## 2015-10-26 NOTE — Progress Notes (Signed)
   Subjective:    Patient ID: Stephen King, male    DOB: November 02, 1950, 65 y.o.   MRN: [id number]  HPI Patient is a 65 year old male who presents to the clinic with left knee pain. He has a history of knee pain. He was seen by Dr. Darene Lamer about 2 years ago and had Orthovisc shots in his right knee. He has had good relief from the shots. His left knee is coughing the most pain today. At times it can be 8-9 out of 10 in pain. It seems to be localized around the medial aspect of left knee. He denies any known trauma or injury. He does have a history of being hard on his face. He exercises regularly. He is very active. He notices a lot of the pain when he is standing for long periods of time. He has not taken anything except Tylenol for the pain.   Review of Systems  All other systems reviewed and are negative.      Objective:   Physical Exam  Constitutional: He is oriented to person, place, and time. He appears well-developed and well-nourished.  HENT:  Head: Normocephalic and atraumatic.  Musculoskeletal:  Normal ROM of bilateral knees. Mild effusion over superior left knee.  Strength 5/5 bilateral.  Tenderness over left medial joint space.  Negative anterior drawer and mccmurrays.   Neurological: He is alert and oriented to person, place, and time.  Psychiatric: He has a normal mood and affect. His behavior is normal.          Assessment & Plan:  Left knee pain- likely some osteoarthritis. Will get new xrays. Discussed to make appt with sports medicine. Steroid injection given today. Discussed glucosamine, tylenol and fish oil. Stay away from NSAIDs due to anticoagulant use. Ice as needed. He had such a good response with right knee with ortho visc. Shoulder consider left knee for treatment.   Knee Injection Procedure Note  Pre-operative Diagnosis: left knee pain    Post-operative Diagnosis: left knee pain   Indications: pain/inflammation  Anesthesia: ethyl chloride  Procedure  Details   Verbal consent was obtained for the procedure. The joint was prepped with Betadine and a small wheel of anesthetic was injected into the subcutaneous tissue. A 22 gauge needle was inserted into the medial aspect of the left knee in an anterior approach.9  ml 1% lidocaine and 1 ml of 40mg  of depo medrol was then injected into the joint through the same needle. The needle was removed and the area cleansed and dressed.  Complications:  None; patient tolerated the procedure well.   Lipid panel ordered today.

## 2015-10-26 NOTE — Patient Instructions (Signed)
Glucosamine chondrotin Continue tylenol  Tumeric.  Follow up with Dr. Darene Lamer for orthovisc for left knee.

## 2015-11-01 ENCOUNTER — Ambulatory Visit (INDEPENDENT_AMBULATORY_CARE_PROVIDER_SITE_OTHER): Payer: 59 | Admitting: Sports Medicine

## 2015-11-01 ENCOUNTER — Encounter: Payer: Self-pay | Admitting: Sports Medicine

## 2015-11-01 ENCOUNTER — Telehealth: Payer: Self-pay | Admitting: Sports Medicine

## 2015-11-01 ENCOUNTER — Telehealth: Payer: Self-pay | Admitting: Physician Assistant

## 2015-11-01 DIAGNOSIS — M170 Bilateral primary osteoarthritis of knee: Secondary | ICD-10-CM | POA: Diagnosis not present

## 2015-11-01 NOTE — Telephone Encounter (Signed)
-----   Message from Silverio Decamp, MD sent at 11/01/2015  2:19 PM EDT ----- Orthovisc approval please both knees, XR confirmed, failed steroid injections, on Xarelto so NSAIDS contraindicated. Stephen King.

## 2015-11-01 NOTE — Telephone Encounter (Signed)
Submitted for approval on Orthovisc. Awaiting confirmation.  

## 2015-11-01 NOTE — Assessment & Plan Note (Signed)
Good response Orthovisc in the right knee 2 years ago. Left knee steroid injection a month ago did not provide any relief, repeating steroid injection today this time with kenalog 40, and I am going to get him set up for Orthovisc in both knees. He continue with Glucosamine and Chondroitin as well as Tylenol arthritis strength. I will see him back to start the Orthovisc injections.

## 2015-11-01 NOTE — Telephone Encounter (Signed)
Please see other phone note

## 2015-11-01 NOTE — Progress Notes (Signed)
   Subjective:    I'm seeing this patient as a consultation for:  Iran Planas, PA-C  CC: Left knee pain  HPI: This is a pleasant 65 year old male, consulted on him for right knee osteoarthritis approximately 2 years ago, we did a series of Orthovisc and he was good for 2 years. The right knee is okay, but he had been having some pain in his left knee, he was given a Depo-Medrol injection approximately a month ago, unfortunately did not get any relief. At this point he is referred to me for consideration of further and definitive treatment of left knee osteoarthritis and consideration of Visco supplementation.  Past medical history, Surgical history, Family history not pertinant except as noted below, Social history, Allergies, and medications have been entered into the medical record, reviewed, and no changes needed.   Review of Systems: No headache, visual changes, nausea, vomiting, diarrhea, constipation, dizziness, abdominal pain, skin rash, fevers, chills, night sweats, weight loss, swollen lymph nodes, body aches, joint swelling, muscle aches, chest pain, shortness of breath, mood changes, visual or auditory hallucinations.   Objective:   General: Well Developed, well nourished, and in no acute distress.  Neuro/Psych: Alert and oriented x3, extra-ocular muscles intact, able to move all 4 extremities, sensation grossly intact. Skin: Warm and dry, no rashes noted.  Respiratory: Not using accessory muscles, speaking in full sentences, trachea midline.  Cardiovascular: Pulses palpable, no extremity edema. Abdomen: Does not appear distended. Left Knee: Normal to inspection with no erythema or effusion or obvious bony abnormalities. Tender to palpation at the medial joint line ROM normal in flexion and extension and lower leg rotation. Ligaments with solid consistent endpoints including ACL, PCL, LCL, MCL. Negative Mcmurray's and provocative meniscal tests. Non painful patellar  compression. Patellar and quadriceps tendons unremarkable. Hamstring and quadriceps strength is normal.  Procedure: Real-time Ultrasound Guided Injection of left knee Device: GE Logiq E  Verbal informed consent obtained.  Time-out conducted.  Noted no overlying erythema, induration, or other signs of local infection.  Skin prepped in a sterile fashion.  Local anesthesia: Topical Ethyl chloride.  With sterile technique and under real time ultrasound guidance:  1 mL kenalog 40, 2 mL lidocaine, 2 mL Marcaine injected easily. Completed without difficulty  Pain immediately resolved suggesting accurate placement of the medication.  Advised to call if fevers/chills, erythema, induration, drainage, or persistent bleeding.  Images permanently stored and available for review in the ultrasound unit.  Impression: Technically successful ultrasound guided injection.  Impression and Recommendations:   This case required medical decision making of moderate complexity.  Primary osteoarthritis of both knees Good response Orthovisc in the right knee 2 years ago. Left knee steroid injection a month ago did not provide any relief, repeating steroid injection today this time with kenalog 40, and I am going to get him set up for Orthovisc in both knees. He continue with Glucosamine and Chondroitin as well as Tylenol arthritis strength. I will see him back to start the Orthovisc injections.

## 2015-11-03 LAB — LIPID PANEL
CHOLESTEROL: 151 mg/dL (ref 125–200)
HDL: 53 mg/dL (ref >=40)
LDL Cholesterol: 90 mg/dL (ref <130)
TRIGLYCERIDES: 42 mg/dL (ref <150)
Total CHOL/HDL Ratio: 2.8 Ratio (ref <=5.0)
VLDL: 8 mg/dL (ref <30)

## 2015-11-07 NOTE — Telephone Encounter (Signed)
Received the following from OV beneits investigation:  Patient has Self-Funded [employer] Choice Plus POS with an effective date of 03/24/2012. The doctor may not buy and bill, the medication must be obtained through the specialty pharmacy. Please contact the Orthovisc line if you would like medication transferred to specialty pharmacy. Medical notes must be submitted with the claim. Include medical necessity, diagnosis, and all clinical. [id number] is covered at 100% of the contracted rate when performed in an office setting. The deductible does not apply. If out of pocket is met, Copay will no longer apply. $60.00 Copay for specialist office visit applies. Reference# [id number].   PA completed via CoverMyMeds. Sent to plan for review.

## 2015-11-08 NOTE — Telephone Encounter (Signed)
Insurance requested clinical notes. Notes faxed.

## 2015-12-11 ENCOUNTER — Ambulatory Visit (INDEPENDENT_AMBULATORY_CARE_PROVIDER_SITE_OTHER): Payer: 59 | Admitting: Physician Assistant

## 2015-12-11 ENCOUNTER — Encounter: Payer: Self-pay | Admitting: Physician Assistant

## 2015-12-11 VITALS — BP 128/77 | HR 84 | Ht 70.0 in | Wt 222.0 lb

## 2015-12-11 DIAGNOSIS — J069 Acute upper respiratory infection, unspecified: Secondary | ICD-10-CM | POA: Diagnosis not present

## 2015-12-11 DIAGNOSIS — J0110 Acute frontal sinusitis, unspecified: Secondary | ICD-10-CM | POA: Diagnosis not present

## 2015-12-11 MED ORDER — AMOXICILLIN-POT CLAVULANATE 875-125 MG PO TABS: 1.0000 | ORAL_TABLET | Freq: Two times a day (BID) | ORAL | 0 refills | Status: DC

## 2015-12-11 NOTE — Progress Notes (Addendum)
Subjective:     Patient ID: Stephen King, male   DOB: 17-Jul-1950, 65 y.o.   MRN: [id number]  HPI Patient is a 65 y.o. Caucasian male presenting today with complaints of sore throat, headache, cough, and congestion that began last Sunday (september 9th). Patient reports that his symptoms have not improved for the last 10 days. The patient also states that his right ear feels like there is "water in it" with decreased hearing. Patient notes that he has had recent sick contact with co-workers and that he has been talking alka-selzer plus over-the-counter with minimal symptom relief. Patient denies allergies, fever, chills, smoking, or having had his flu shot.  Review of Systems  Constitutional: Positive for chills, diaphoresis and fatigue. Negative for activity change, appetite change, fever and unexpected weight change.  HENT: Positive for congestion, ear pain, hearing loss, postnasal drip, rhinorrhea, sinus pressure and sneezing. Negative for ear discharge and sore throat.   Eyes: Negative for pain, discharge, redness and itching.  Respiratory: Positive for cough. Negative for chest tightness, shortness of breath and wheezing.   Cardiovascular: Negative for chest pain, palpitations and leg swelling.  Gastrointestinal: Positive for diarrhea. Negative for abdominal distention, abdominal pain, anal bleeding, blood in stool, constipation, nausea and vomiting.  Genitourinary: Negative.   Musculoskeletal: Positive for myalgias.  Skin: Negative for color change, pallor, rash and wound.  Neurological: Positive for headaches. Negative for dizziness, syncope, weakness, light-headedness and numbness.      Objective:   Physical Exam  Constitutional: He is oriented to person, place, and time. He appears well-developed and well-nourished. No distress.  HENT:  Head: Normocephalic and atraumatic.  Right Ear: External ear normal.  Left Ear: External ear normal.  Nose: Nose normal.  Mouth/Throat: Oropharynx  is clear and moist. No oropharyngeal exudate.  Eyes: Conjunctivae and EOM are normal. Pupils are equal, round, and reactive to light. Right eye exhibits no discharge. Left eye exhibits no discharge. No scleral icterus.  Neck: Normal range of motion. Neck supple. No JVD present. No tracheal deviation present. No thyromegaly present.  Cardiovascular: Normal rate, regular rhythm, normal heart sounds and intact distal pulses.  Exam reveals no gallop and no friction rub.   No murmur heard. Pulmonary/Chest: Effort normal and breath sounds normal. No stridor. No respiratory distress. He has no wheezes. He has no rales. He exhibits no tenderness.  Lymphadenopathy:    He has no cervical adenopathy.  Neurological: He is alert and oriented to person, place, and time. He displays normal reflexes. No cranial nerve deficit. Coordination normal.  Skin: Skin is warm and dry. No rash noted. He is not diaphoretic. No erythema. No pallor.  Psychiatric: He has a normal mood and affect. His behavior is normal. Judgment and thought content normal.      Assessment:     Diagnoses and all orders for this visit:  Acute upper respiratory infection -     amoxicillin-clavulanate (AUGMENTIN) 875-125 MG tablet; Take 1 tablet by mouth 2 (two) times daily. For 10 days.  Acute frontal sinusitis, recurrence not specified -     amoxicillin-clavulanate (AUGMENTIN) 875-125 MG tablet; Take 1 tablet by mouth 2 (two) times daily. For 10 days.   Plan:     1. Acute upper respiratory infection/sinusitis - Suspect patients etiology is likely secondary to bacteria due to the prolonged symptom duration. Patient started on Augmentin 875-125mg  tablets twice daily for 10 days. Patient to continue symptomatic treatment with over-the-counter decongestants and anti-histamines. Patient instructed to return-to-clinic if symptoms  persist or worsen for potential medrol dose pack.  Summary- Patient to return-to-clinic if symptoms persist or  worsen.

## 2016-02-26 IMAGING — CR DG KNEE AP/LAT W/ SUNRISE*R*
3 series · 3 of 3 positions shown · non-contrast
Comparison: None.

CLINICAL DATA: Knee pain, fall

EXAM:
DG KNEE - 3 VIEWS

[view not recorded (1 of 3)]
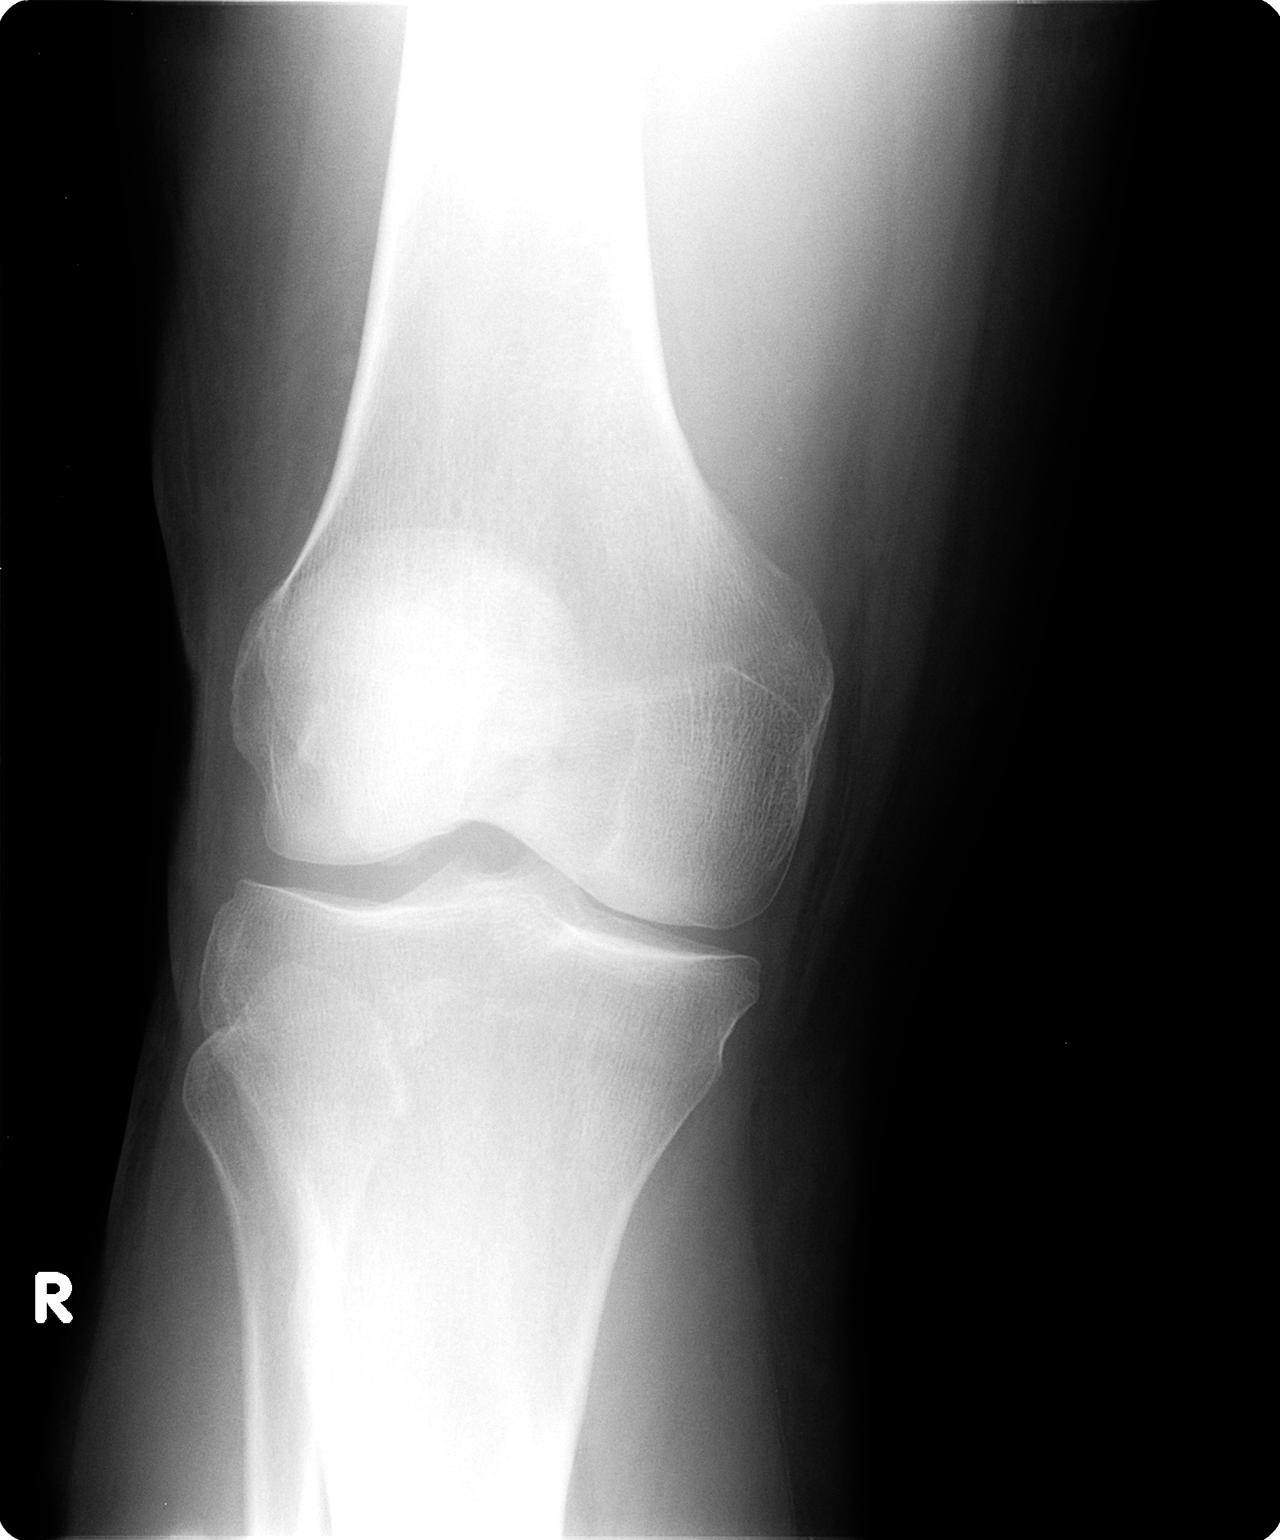

[view not recorded (2 of 3)]
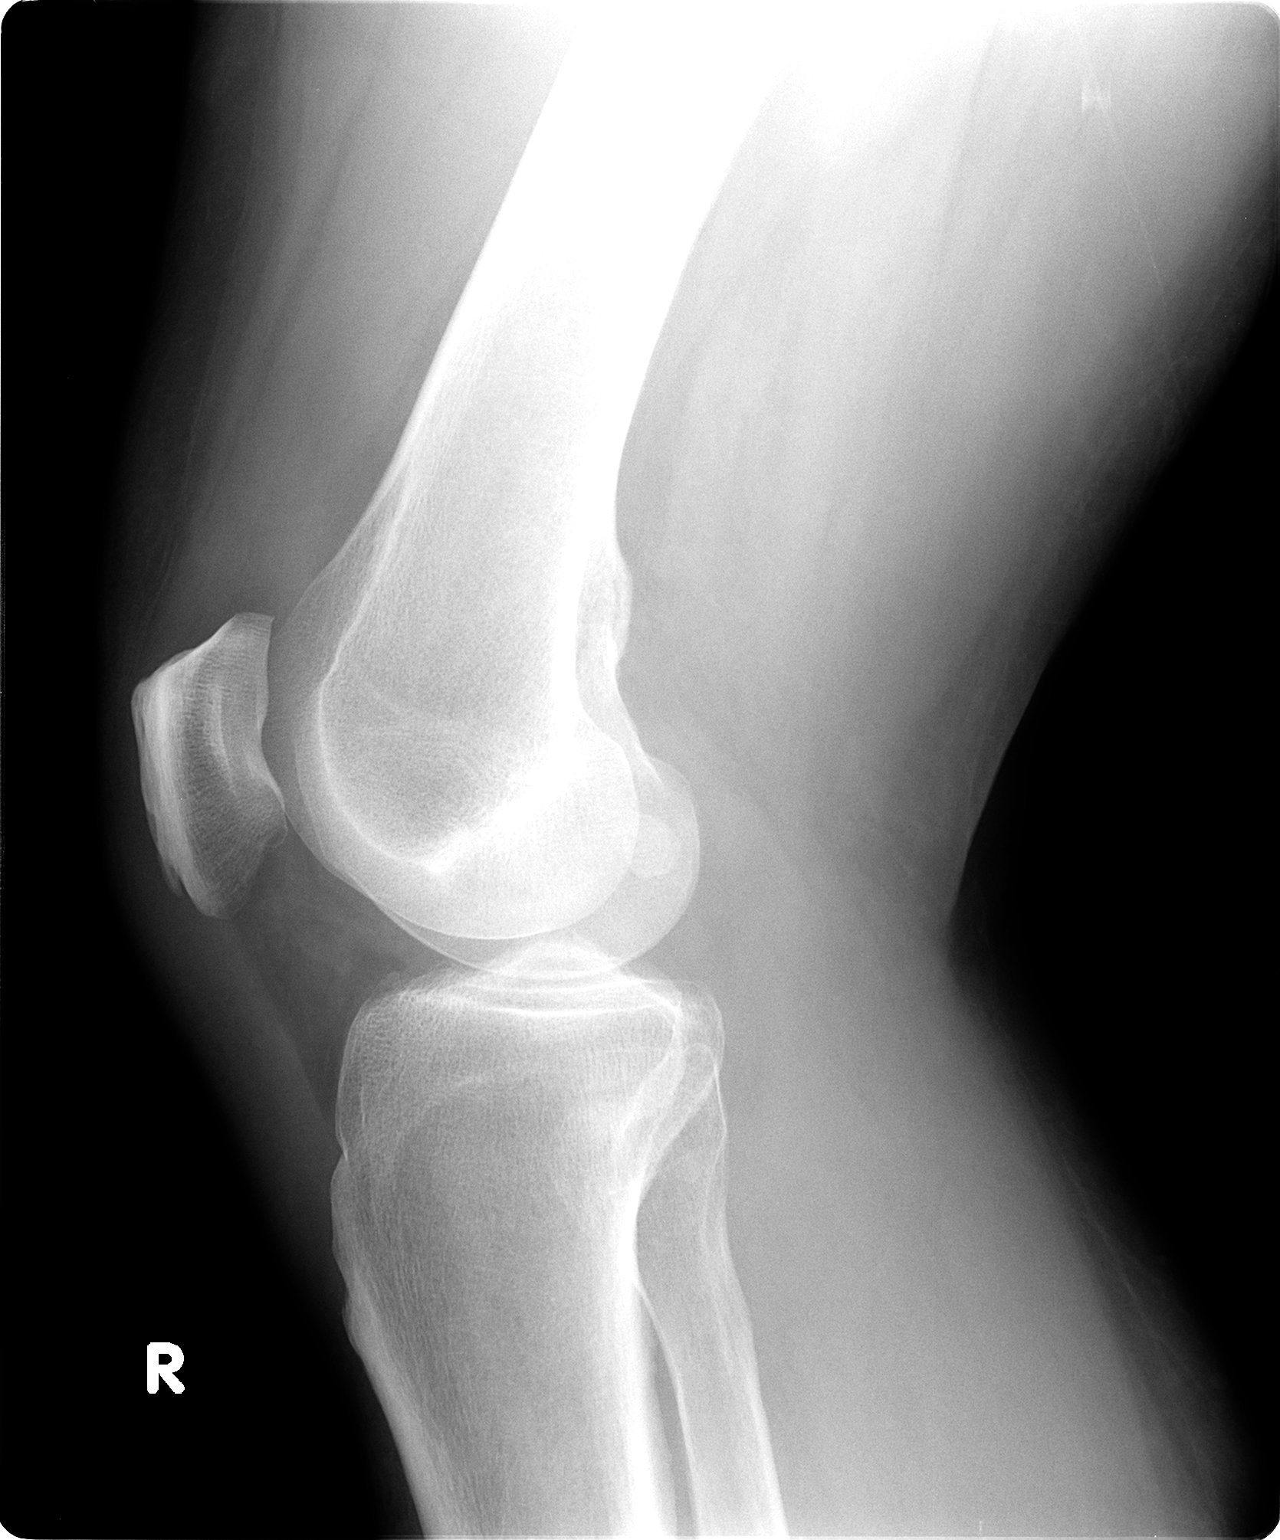

[view not recorded (3 of 3)]
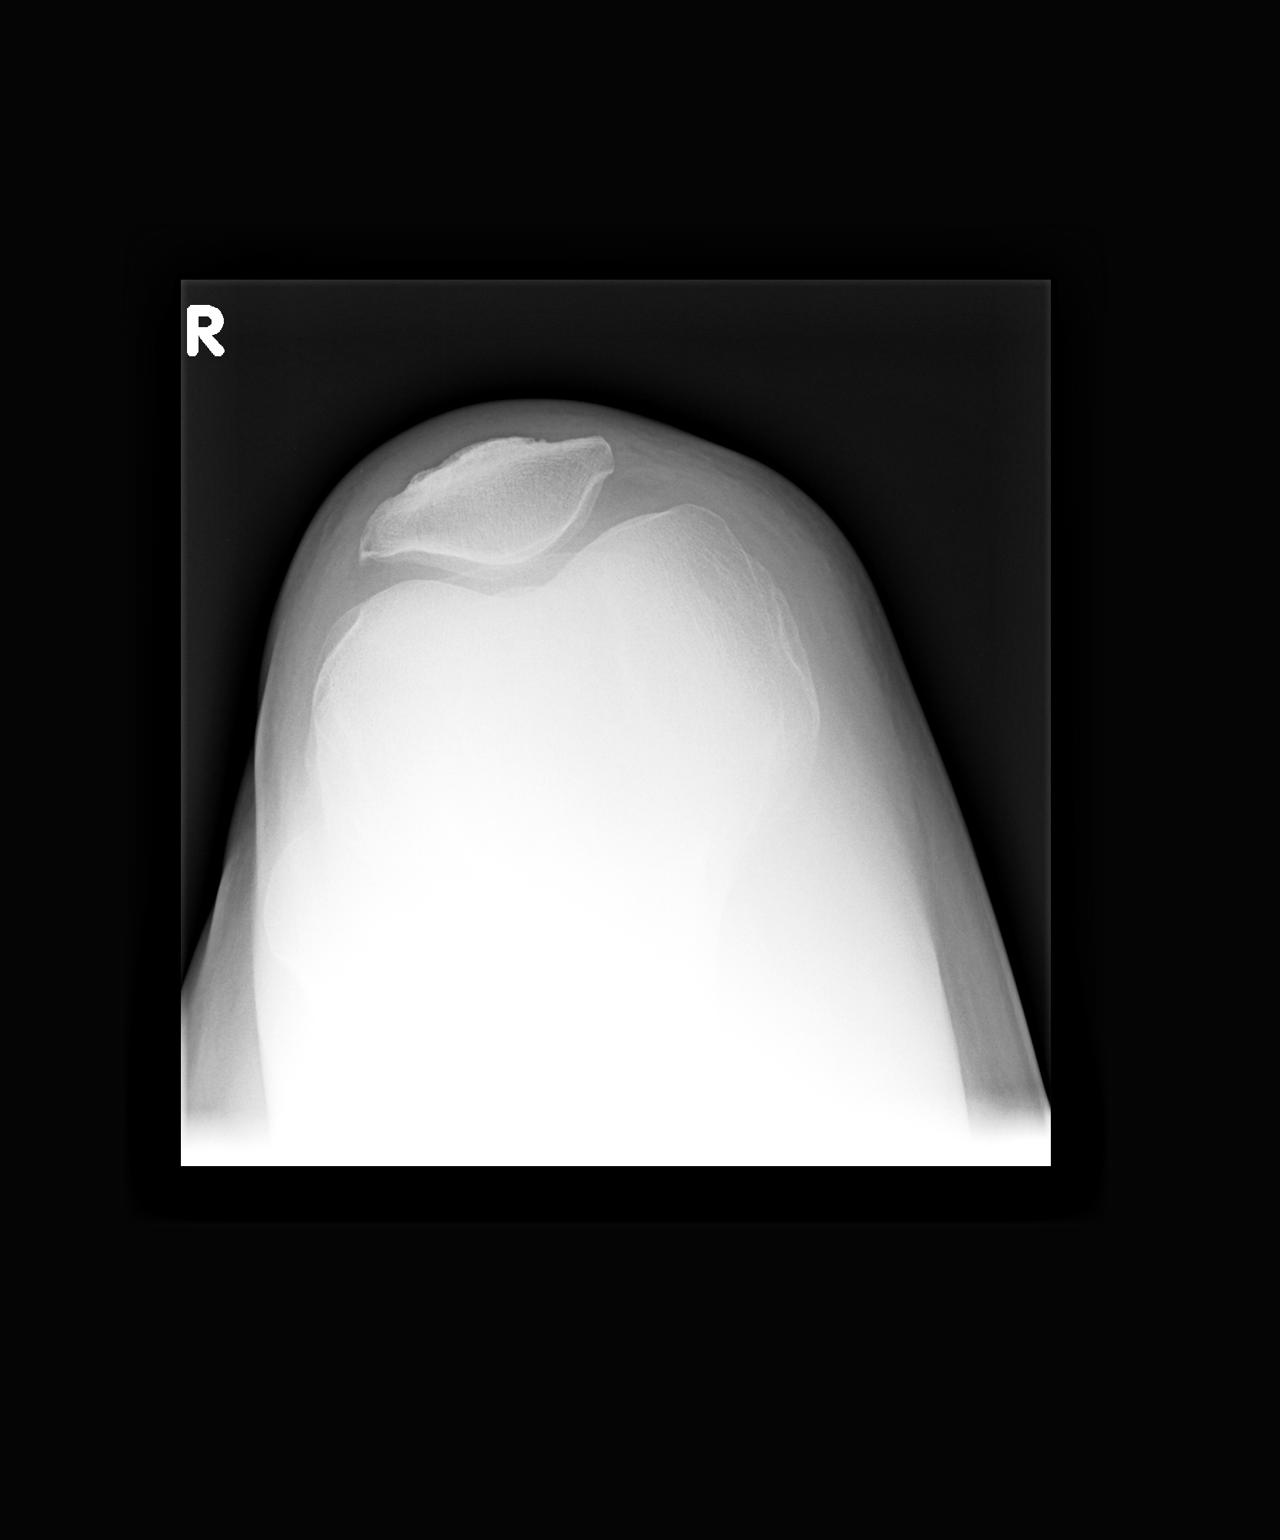

[3 of 3 positions shown; findings below may reference images not displayed]

FINDINGS: There is no evidence of fracture, dislocation, or joint effusion.
There is no evidence of arthropathy or other focal bone abnormality.
Mild soft tissue swelling. No radiopaque foreign body.
IMPRESSION: Mild soft tissue swelling.  No acute osseous finding.

## 2016-03-07 ENCOUNTER — Ambulatory Visit (INDEPENDENT_AMBULATORY_CARE_PROVIDER_SITE_OTHER): Payer: 59 | Admitting: Physician Assistant

## 2016-03-07 ENCOUNTER — Encounter: Payer: Self-pay | Admitting: Physician Assistant

## 2016-03-07 VITALS — BP 143/87 | HR 73 | Ht 70.0 in | Wt 226.0 lb

## 2016-03-07 DIAGNOSIS — M791 Myalgia, unspecified site: Secondary | ICD-10-CM

## 2016-03-07 DIAGNOSIS — R531 Weakness: Secondary | ICD-10-CM

## 2016-03-07 DIAGNOSIS — Z23 Encounter for immunization: Secondary | ICD-10-CM | POA: Diagnosis not present

## 2016-03-07 DIAGNOSIS — Z1211 Encounter for screening for malignant neoplasm of colon: Secondary | ICD-10-CM

## 2016-03-07 DIAGNOSIS — M2550 Pain in unspecified joint: Secondary | ICD-10-CM

## 2016-03-07 MED ORDER — DICLOFENAC SODIUM 2 % TD SOLN: TRANSDERMAL | 2 refills | Status: DC

## 2016-03-07 NOTE — Progress Notes (Signed)
Subjective:    Patient ID: Stephen King, male    DOB: 01/16/1951, 65 y.o.   MRN: [id number]  HPI  Pt is a 65 yo male who presents to the clinic with aching all over for the past 4-5 weeks. At times his hands, ankles, and knees swell but not all at the same time. Glucosamine chondroitin seems to make worse. He has had some scrotum pain episodic that will last 1 hour and then resolve. It has been going on for 2 years.    Needs colonoscopy. Father had colon cancer.      Review of Systems    see HPI.  Objective:   Physical Exam  Constitutional: He is oriented to person, place, and time. He appears well-developed and well-nourished.  HENT:  Head: Normocephalic and atraumatic.  Cardiovascular: Normal rate, regular rhythm and normal heart sounds.   Pulmonary/Chest: Effort normal and breath sounds normal.  Musculoskeletal:  NOrmal ROM of bilateral shoulders. Bilateral upper and lower extremity strength 5/5.  No swelling noted for joints.   Neurological: He is alert and oriented to person, place, and time.  Psychiatric: He has a normal mood and affect. His behavior is normal.          Assessment & Plan:  Marland KitchenMarland KitchenDiagnoses and all orders for this visit:  Multiple joint pain -     CBC with Differential/Platelet -     COMPLETE METABOLIC PANEL WITH GFR -     Sed Rate (ESR) -     PSA, total and free -     Rheumatoid factor -     Cyclic citrul peptide antibody, IgG -     Diclofenac Sodium (PENNSAID) 2 % SOLN; Use 1-2 pumps twice a day on painful area. -     TSH -     C-reactive protein -     CK  Weakness -     CBC with Differential/Platelet -     COMPLETE METABOLIC PANEL WITH GFR -     Sed Rate (ESR) -     PSA, total and free -     Rheumatoid factor -     Cyclic citrul peptide antibody, IgG -     TSH -     C-reactive protein -     CK -     Flu Vaccine QUAD 36+ mos PF IM (Fluarix & Fluzone Quad PF) -     Ambulatory referral to Gastroenterology  Myalgia -     Diclofenac  Sodium (PENNSAID) 2 % SOLN; Use 1-2 pumps twice a day on painful area. -     CK  Influenza vaccine needed -     Flu Vaccine QUAD 36+ mos PF IM (Fluarix & Fluzone Quad PF)  Colon cancer screening -     Ambulatory referral to Gastroenterology   Unclear etiology will get labs to evaluate. Could be arthritis. Glucosamine chondrotin made worse. NSAIDs not indicated due to being on xaralto. pennsaid cream given. Tumeric to consider. Add fish oil 4065m daily.

## 2016-03-07 NOTE — Patient Instructions (Signed)
Fish oil 4000mg  daily.  pennsaid as needed twice a day.  Tumeric if would like to try. 500mg  twice a day.

## 2016-03-08 LAB — COMPLETE METABOLIC PANEL WITH GFR
ALT: 32 U/L (ref 9–46)
AST: 21 U/L (ref 10–35)
Albumin: 3.9 g/dL (ref 3.6–5.1)
Alkaline Phosphatase: 57 U/L (ref 40–115)
BUN: 10 mg/dL (ref 7–25)
CALCIUM: 9.2 mg/dL (ref 8.6–10.3)
CHLORIDE: 104 mmol/L (ref 98–110)
CO2: 27 mmol/L (ref 20–31)
CREATININE: 0.97 mg/dL (ref 0.70–1.25)
GFR, Est African American: >89 mL/min (ref >=60)
GFR, Est Non African American: 82 mL/min (ref >=60)
Glucose, Bld: 82 mg/dL (ref 65–99)
Potassium: 4.5 mmol/L (ref 3.5–5.3)
Sodium: 139 mmol/L (ref 135–146)
TOTAL PROTEIN: 6.4 g/dL (ref 6.1–8.1)
Total Bilirubin: 0.6 mg/dL (ref 0.2–1.2)

## 2016-03-08 LAB — CBC WITH DIFFERENTIAL/PLATELET
BASOS PCT: 0 %
Basophils Absolute: 0 cells/uL (ref 0–200)
Eosinophils Absolute: 63 cells/uL (ref 15–500)
Eosinophils Relative: 1 %
HEMATOCRIT: 45.2 % (ref 38.5–50.0)
Hemoglobin: 15.3 g/dL (ref 13.2–17.1)
Lymphocytes Relative: 37 %
Lymphs Abs: 2331 cells/uL (ref 850–3900)
MCH: 31.4 pg (ref 27.0–33.0)
MCHC: 33.8 g/dL (ref 32.0–36.0)
MCV: 92.6 fL (ref 80.0–100.0)
MPV: 10.1 fL (ref 7.5–12.5)
Monocytes Absolute: 693 cells/uL (ref 200–950)
Monocytes Relative: 11 %
NEUTROS ABS: 3213 {cells}/uL (ref 1500–7800)
Neutrophils Relative %: 51 %
Platelets: 293 10*3/uL (ref 140–400)
RBC: 4.88 MIL/uL (ref 4.20–5.80)
RDW: 13.7 % (ref 11.0–15.0)
WBC: 6.3 10*3/uL (ref 3.8–10.8)

## 2016-03-08 LAB — SEDIMENTATION RATE: SED RATE: 1 mm/h (ref 0–20)

## 2016-03-10 DIAGNOSIS — M791 Myalgia, unspecified site: Secondary | ICD-10-CM | POA: Insufficient documentation

## 2016-03-10 DIAGNOSIS — R531 Weakness: Secondary | ICD-10-CM | POA: Insufficient documentation

## 2016-03-10 DIAGNOSIS — M2550 Pain in unspecified joint: Secondary | ICD-10-CM | POA: Insufficient documentation

## 2016-03-10 LAB — CYCLIC CITRUL PEPTIDE ANTIBODY, IGG: Cyclic Citrullin Peptide Ab: <16 Units

## 2016-03-10 LAB — PSA, TOTAL AND FREE
PSA, % Free: 67 % (ref >25)
PSA, Free: 0.4 ng/mL
PSA, Total: 0.6 ng/mL (ref <=4.0)

## 2016-03-10 LAB — RHEUMATOID FACTOR

## 2016-03-11 LAB — TSH: TSH: 1.49 mIU/L (ref 0.40–4.50)

## 2016-03-12 LAB — CK: CK TOTAL: 125 U/L (ref 7–232)

## 2016-03-12 NOTE — Progress Notes (Signed)
Call pt: Thyroid look great.  RA panel notes negative.  All other labs look good.  Pending CK.

## 2016-03-13 LAB — C-REACTIVE PROTEIN: CRP: 3.3 mg/L (ref <8.0)

## 2016-03-14 ENCOUNTER — Telehealth: Payer: Self-pay | Admitting: Pharmacist

## 2016-03-14 NOTE — Telephone Encounter (Signed)
Received clearance request from Dr Cindee Salt with [facility] that pt is scheduled for upcoming colonoscopy on 04/09/16 with request to hold Xarelto for 4 days prior. 4 days is excessive since Xarelto is cleared within 24 hours. Pt has afib and hx of stroke, recommend that pt only hold Xarelto the evening prior to procedure and resume ASAP. Clearance faxed to [phone].

## 2016-04-04 ENCOUNTER — Encounter: Payer: Self-pay | Admitting: Physician Assistant

## 2016-04-21 ENCOUNTER — Ambulatory Visit (INDEPENDENT_AMBULATORY_CARE_PROVIDER_SITE_OTHER): Payer: 59 | Admitting: Cardiology

## 2016-04-21 ENCOUNTER — Encounter: Payer: Self-pay | Admitting: Cardiology

## 2016-04-21 VITALS — BP 118/82 | HR 70 | Ht 70.0 in | Wt 228.8 lb

## 2016-04-21 DIAGNOSIS — I480 Paroxysmal atrial fibrillation: Secondary | ICD-10-CM | POA: Diagnosis not present

## 2016-04-21 DIAGNOSIS — Z8673 Personal history of transient ischemic attack (TIA), and cerebral infarction without residual deficits: Secondary | ICD-10-CM

## 2016-04-21 DIAGNOSIS — Z7901 Long term (current) use of anticoagulants: Secondary | ICD-10-CM | POA: Diagnosis not present

## 2016-04-21 MED ORDER — RIVAROXABAN 20 MG PO TABS: 20.0000 mg | ORAL_TABLET | Freq: Every day | ORAL | 3 refills | Status: DC

## 2016-04-21 NOTE — Progress Notes (Signed)
[facility] [address], [place]  [address] Phone: [phone] Fax:  [phone]  Date:  04/21/2016   ID:  Stephen King, DOB [birth year], MRN [id number]  PCP:  Iran Planas, PA-C   History of Present Illness: Stephen King is a 66 y.o. male here for follow up of atrial fibrillation with rapid ventricular response following hospitalization on 08/22/13. Stroke in 2011 and presented with substernal chest pain radiating to his back lasting approximately 1-1/2 hours associated with shortness of breath lightheadedness, nausea rated as a 10 over 10 at its worst. Initial troponin was normal. He was placed on anticoagulation. Echocardiogram showed normal ejection fraction with no wall motion abnormalities. He was observed on telemetry and converted. 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 No further CP. Also recently went to [place], cruise.  He's had no alcohol use, no fevers, no surgeries, no thyroid issues. No amphetamines. He has rare bouts of atrial fibrillation.  Rare bouts of dizziness, hypotension. Blood pressure has been soft in the past. He is on no antihypertensives.    Wt Readings from Last 3 Encounters:  04/21/16 228 lb 12 oz (103.8 kg)  03/07/16 226 lb (102.5 kg)  12/11/15 222 lb (100.7 kg)     Past Medical History:  Diagnosis Date  . Arthritis   . Dysrhythmia    PAF  . Hernia, umbilical   . Stroke ([facility]) 10/11    Past Surgical History:  Procedure Laterality Date  . c6-7 fusion  10/2005  . cataracts    . HEMORROIDECTOMY    . neck fusion    . SHOULDER ARTHROSCOPY WITH ROTATOR CUFF REPAIR Right 10/30/2014   Procedure: SHOULDER ARTHROSCOPY WITH ROTATOR CUFF REPAIR;  Surgeon: Tania Ade, MD;  Location: [facility];  Service: Orthopedics;  Laterality: Right;  . SHOULDER ARTHROSCOPY WITH SUBACROMIAL DECOMPRESSION Right 10/30/2014   Procedure: Right  shoulder arthroscopy rotator cuff repair, subacromial decompression;  Surgeon: Tania Ade, MD;  Location: [facility];  Service: Orthopedics;  Laterality: Right;  Right shoulder arthrscopy rotator cuff repair, subacromial decompression    Current Outpatient Prescriptions  Medication Sig Dispense Refill  . rivaroxaban (XARELTO) 20 MG TABS tablet Take 1 tablet (20 mg total) by mouth daily with supper. 90 tablet 3  . Diclofenac Sodium (PENNSAID) 2 % SOLN Use 1-2 pumps twice a day on painful area. (Patient not taking: Reported on 04/21/2016) 2 Bottle 2   No current facility-administered medications for this visit.     Allergies:    Allergies  Allergen Reactions  . Feldene [Piroxicam] Hives    Social History:  The patient  reports that he quit smoking about 17 years ago. His smoking use included Cigarettes. He has never used smokeless tobacco. He reports that he does not drink alcohol or use drugs.   Family History  Problem Relation Age of Onset  . Alcohol abuse Mother   . Colon cancer Father   . Alcohol abuse Father   . Stroke Maternal Grandfather     ROS:  Please see the history of present illness.   Denies any bleeding, syncope, orthopnea, PND. 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 Just completed Iris study.   All other systems reviewed and negative.   PHYSICAL EXAM: VS:  BP 118/82   Pulse 70   Ht 5\' 10"  (1.778  m)   Wt 228 lb 12 oz (103.8 kg)   BMI 32.82 kg/m  Well nourished, well developed, in no acute distress HEENT: [place]/AT, EOMI, right parotid mass. Rosacea Neck: no JVD, normal carotid upstroke, no bruit Cardiac:  normal S1, S2; RRR; no murmur Lungs:  clear to auscultation bilaterally, no wheezing, rhonchi or rales Abd: soft, nontender, no hepatomegaly, no bruitsoverweight Ext: no edema, 2+ distal pulses Skin: warm and dry GU: deferred Neuro: no focal abnormalities noted, AAO x 3  EKG:  EKG ordered today- 10/23/15-sinus  rhythm, sinus arrhythmia heart rate 70 bpm, no other abnormalities. 04/04/14-sinus rhythm, 63, no other abnormalities-previous 08/21/13-sinus rhythm, no other abnormalities 08/20/13-atrial fibrillation heart rate 165 with nonspecific ST-T wave changes   ECHO: 08/21/13 - Left ventricle: The cavity size was normal. There was mild concentric hypertrophy. Systolic function was normal. The estimated ejection fraction was in the range of 60% to 65%. Wall motion was normal; there were no regional wall motion abnormalities. - Mitral valve: There was systolic anterior motion. There was mild regurgitation. - Right ventricle: The cavity size was mildly dilated. Wall thickness was normal. - Right atrium: The atrium was mildly dilated.    Labs: 08/21/13-troponin normal, hemoglobin A1c 5.1, TSH 1.3, creatinine 0.9, LDL 67, hemoglobin 12.8  ASSESSMENT AND PLAN:   1. Paroxysmal atrial fibrillation with rapid ventricular response - currently sinus rhythm. Low-dose metoprolol, when necessary. Soft blood pressure at baseline, was having some symptoms of tunnel vision, likely hypotension periodically. Hydrate.. In September had an episode of what he thinks was atrial fibrillation again postoperatively. Took an extra metoprolol, lay down and felt better. I also encouraged him to liberalize salt intake and fluid intake to see if this helps him with his hypotension, occasional dizziness. 2. Anticoagulation- Xarelto. CHADS-VASc (CVA-2) expressed the importance of anticoagulation. He understands. Continue. No signs of bleeding. Basic metabolic profile and CBC every 6 months. Previously unremarkable 3. Six-month followup  Signed, Candee Furbish, MD [facility]  04/21/2016 3:10 PM

## 2016-04-21 NOTE — Patient Instructions (Signed)
Medication Instructions:  The current medical regimen is effective;  continue present plan and medications.  Follow-Up: Follow up in 6 months with Katy Thompson, PA.  You will receive a letter in the mail 2 months before you are due.  Please call us when you receive this letter to schedule your follow up appointment.  Follow up in 1 year with Dr. Skains.  You will receive a letter in the mail 2 months before you are due.  Please call us when you receive this letter to schedule your follow up appointment.  If you need a refill on your cardiac medications before your next appointment, please call your pharmacy.  Thank you for choosing [facility]!!     

## 2016-04-29 LAB — HM COLONOSCOPY

## 2016-05-05 ENCOUNTER — Encounter: Payer: Self-pay | Admitting: Gastroenterology

## 2016-05-06 ENCOUNTER — Telehealth: Payer: Self-pay | Admitting: *Deleted

## 2016-05-06 NOTE — Telephone Encounter (Signed)
[id number] - PA Case ID: [id number] pennsaid submitted to through covermymeds

## 2016-05-08 ENCOUNTER — Other Ambulatory Visit: Payer: Self-pay | Admitting: Cardiology

## 2016-05-13 NOTE — Telephone Encounter (Signed)
Pennsaid is a policy exclusion. Patient notified.price may be about $30 and go to [email or web address] for coupon

## 2016-05-18 ENCOUNTER — Encounter: Payer: Self-pay | Admitting: Physician Assistant

## 2016-05-21 ENCOUNTER — Encounter: Payer: Self-pay | Admitting: Cardiology

## 2016-05-22 ENCOUNTER — Telehealth: Payer: Self-pay | Admitting: Cardiology

## 2016-05-22 NOTE — Telephone Encounter (Signed)
Attempted to contact pt about holding Xarelto.  Discussed with Dr Marlou Porch who gave verbal orders for pt to hold Xarelto today and to restart once dentist states it is OK to resume.  Left message at home number for pt to call back ASAP.

## 2016-05-22 NOTE — Telephone Encounter (Signed)
Spoke with pt who is aware to hold his Xarelto today and resume as soon as his dentist states he can.  Pt states understanding and has no further questions.  Advised pt that if this occurs again he should call directly and ask for me or to leave a message for me and I will call him back.    Attempted to contact pt about holding Xarelto.  Discussed with Dr Marlou Porch who gave verbal orders for pt to hold Xarelto today and to restart once dentist states it is OK to resume.  Left message at home number for pt to call back ASAP.

## 2016-05-22 NOTE — Telephone Encounter (Signed)
Follow Up:     Returning your call about his Xarelto.

## 2016-06-18 ENCOUNTER — Encounter: Payer: Self-pay | Admitting: Emergency Medicine

## 2016-06-18 ENCOUNTER — Emergency Department
Admission: EM | Admit: 2016-06-18 | Discharge: 2016-06-18 | Disposition: A | Discharge status: Home / Self Care | Payer: 59 | Source: Home / Self Care | Attending: Family Medicine | Admitting: Family Medicine

## 2016-06-18 ENCOUNTER — Ambulatory Visit: Payer: 59 | Admitting: Physician Assistant

## 2016-06-18 DIAGNOSIS — J019 Acute sinusitis, unspecified: Secondary | ICD-10-CM | POA: Diagnosis not present

## 2016-06-18 DIAGNOSIS — B9689 Other specified bacterial agents as the cause of diseases classified elsewhere: Secondary | ICD-10-CM | POA: Diagnosis not present

## 2016-06-18 LAB — POCT RAPID STREP A (OFFICE): Rapid Strep A Screen: NEGATIVE

## 2016-06-18 MED ORDER — BENZONATATE 100 MG PO CAPS: 100.0000 mg | ORAL_CAPSULE | Freq: Three times a day (TID) | ORAL | 0 refills | Status: DC

## 2016-06-18 MED ORDER — AMOXICILLIN-POT CLAVULANATE 875-125 MG PO TABS: 1.0000 | ORAL_TABLET | Freq: Two times a day (BID) | ORAL | 0 refills | Status: DC

## 2016-06-18 NOTE — ED Provider Notes (Signed)
CSN: [id number]     Arrival date & time 06/18/16  1627 History   First MD Initiated Contact with Patient 06/18/16 1655     Chief Complaint  Patient presents with  . Sore Throat   (Consider location/radiation/quality/duration/timing/severity/associated sxs/prior Treatment) HPI Stephen King is a 66 y.o. male presenting to UC with c/o 5 days of worsening sore throat, cough, congestion and sinus pain and pressure with bilateral ear pain, body aches, and chills.  Sinus pain and pressure is most bothersome for the pt. Another family member was sick last week.  He has tried OTC cough/cold medication w/o relief.  Denies fever. Denies n/v/d.    Past Medical History:  Diagnosis Date  . Arthritis   . Dysrhythmia    PAF  . Hernia, umbilical   . Stroke ([place]) 10/11   Past Surgical History:  Procedure Laterality Date  . c6-7 fusion  10/2005  . cataracts    . HEMORROIDECTOMY    . neck fusion    . SHOULDER ARTHROSCOPY WITH ROTATOR CUFF REPAIR Right 10/30/2014   Procedure: SHOULDER ARTHROSCOPY WITH ROTATOR CUFF REPAIR;  Surgeon: Tania Ade, MD;  Location: [facility];  Service: Orthopedics;  Laterality: Right;  . SHOULDER ARTHROSCOPY WITH SUBACROMIAL DECOMPRESSION Right 10/30/2014   Procedure: Right shoulder arthroscopy rotator cuff repair, subacromial decompression;  Surgeon: Tania Ade, MD;  Location: [facility];  Service: Orthopedics;  Laterality: Right;  Right shoulder arthrscopy rotator cuff repair, subacromial decompression   Family History  Problem Relation Age of Onset  . Alcohol abuse Mother   . Colon cancer Father   . Alcohol abuse Father   . Stroke Maternal Grandfather    Social History  Substance Use Topics  . Smoking status: Former Smoker    Types: Cigarettes    Quit date: 03/25/1999  . Smokeless tobacco: Never Used  . Alcohol use No    Review of Systems  Constitutional: Negative for chills and fever.  HENT: Positive for congestion, ear  pain, postnasal drip, rhinorrhea, sinus pain and sinus pressure. Negative for sore throat, trouble swallowing and voice change.   Respiratory: Positive for cough. Negative for shortness of breath.   Cardiovascular: Negative for chest pain and palpitations.  Gastrointestinal: Negative for abdominal pain, diarrhea, nausea and vomiting.  Musculoskeletal: Negative for arthralgias, back pain and myalgias.  Skin: Negative for rash.  Neurological: Positive for headaches. Negative for dizziness and light-headedness.    Allergies  Feldene [piroxicam]  Home Medications   Prior to Admission medications   Medication Sig Start Date End Date Taking? Authorizing Provider  amoxicillin-clavulanate (AUGMENTIN) 875-125 MG tablet Take 1 tablet by mouth 2 (two) times daily. One po bid x 7 days 06/18/16   Noland Fordyce, PA-C  benzonatate (TESSALON) 100 MG capsule Take 1-2 capsules (100-200 mg total) by mouth every 8 (eight) hours. 06/18/16   Noland Fordyce, PA-C  rivaroxaban (XARELTO) 20 MG TABS tablet Take 1 tablet (20 mg total) by mouth daily with supper. 04/21/16   Jerline Pain, MD  XARELTO 20 MG TABS tablet TAKE 1 TABLET BY MOUTH  DAILY WITH SUPPER 05/09/16   Jerline Pain, MD   Meds Ordered and Administered this Visit  Medications - No data to display  BP 127/83 (BP Location: Left Arm)   Pulse 71   Temp 98.5 F (36.9 C) (Oral)   Ht 5\' 10"  (1.778 m)   Wt 218 lb (98.9 kg)   SpO2 96%   BMI 31.28 kg/m  No  data found.   Physical Exam  Constitutional: He is oriented to person, place, and time. He appears well-developed and well-nourished. No distress.  HENT:  Head: Normocephalic and atraumatic.  Right Ear: Tympanic membrane normal.  Left Ear: Tympanic membrane normal.  Nose: Mucosal edema present. Right sinus exhibits maxillary sinus tenderness and frontal sinus tenderness. Left sinus exhibits maxillary sinus tenderness and frontal sinus tenderness.  Mouth/Throat: Uvula is midline, oropharynx is  clear and moist and mucous membranes are normal.  Eyes: EOM are normal.  Neck: Normal range of motion. Neck supple.  Cardiovascular: Normal rate and regular rhythm.   Pulmonary/Chest: Effort normal and breath sounds normal. No respiratory distress. He has no wheezes. He has no rales.  Musculoskeletal: Normal range of motion.  Neurological: He is alert and oriented to person, place, and time.  Skin: Skin is warm and dry. He is not diaphoretic.  Psychiatric: He has a normal mood and affect. His behavior is normal.  Nursing note and vitals reviewed.   Urgent Care Course     Procedures (including critical care time)  Labs Review Labs Reviewed  POCT RAPID STREP A (OFFICE)    Imaging Review No results found.    MDM   1. Acute bacterial rhinosinusitis    Hx and exam c/w acute bacterial sinusitis  Rx: Augmentin and tessalon  f/u with PCP in 1 week if not improving.     Noland Fordyce, PA-C 06/18/16 1806

## 2016-06-18 NOTE — ED Triage Notes (Signed)
Sore throat, cough, headache, body aches, chills x 5 days

## 2016-08-04 NOTE — Progress Notes (Signed)
Cardiology Office Note    Date:  08/05/2016   ID:  Stephen King, DOB April 21, 1950, MRN [id number]  PCP:  Donella Stade, PA-C  Cardiologist:  Dr. Marlou Porch  CC: chest pain  History of Present Illness:  Stephen King is a 66 y.o. male with a history of PAF on Xarelto and previous CVA (2011) who presents to clinic for evaluation of chest pain.   He was admitted in 07/2013 for chest pain and found to have afib with RVR. He ruled out for MI. 2D ECHO showed normal LVEF with no RWMAs. Given previous history of CVA, he was started on Xarelto. He was set up to see Dr. Marlou Porch for outpatient follow up.  He was last seen by Dr. Marlou Porch in the office in 03/2016 and doing well. He has low BP at baseline and is maintained on PRN metoprolol to use for palpitations. He has infrequent palpitations, last episode after surgery in fall of 2017. He was told to liberalize salt and water as he occasionally gets dizzy and this was felt to likely be related to hypotension.   Today he presents to clinic for follow up. Since seeing Dr. Marlou Porch in January he has been having periodic episodes of chest pain and SOB. It feels like someone "punched him in the center of his chest." Whenever he gets chest pain, his arm feels heavy and weak. Not worse with exertion. It is constant and dull pain that he rates at a 5/10. Nothing makes it better or worse. Not worse with palpation. He currently is having a dull chest pain right now. 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 Earlier while at work while driving around at work he has some SOB. The most exertion he gets is doing yard work on the weekends. Last weekend he worked in the yard and did fine with no chest pain or SOB. Prior to the current episode of chest pain the last time he had it was about a month ago. He has had about 4 episodes total since january. No associated diaphoresis and nausea. He also has felt like he is "out of sorts" recently. Just  "doesn't feel right," but cannot explain further. No dizziness or syncope. No palpitations but woke up from sleep about a month ago with nausea and pulse felt irregular so he took a metoprolol No further episodes.   No family history of CAD. Mother had a PPM.    Past Medical History:  Diagnosis Date  . Arthritis   . Dysrhythmia    PAF  . Hernia, umbilical   . Stroke ([place]) 10/11    Past Surgical History:  Procedure Laterality Date  . c6-7 fusion  10/2005  . cataracts    . HEMORROIDECTOMY    . neck fusion    . SHOULDER ARTHROSCOPY WITH ROTATOR CUFF REPAIR Right 10/30/2014   Procedure: SHOULDER ARTHROSCOPY WITH ROTATOR CUFF REPAIR;  Surgeon: Tania Ade, MD;  Location: [facility];  Service: Orthopedics;  Laterality: Right;  . SHOULDER ARTHROSCOPY WITH SUBACROMIAL DECOMPRESSION Right 10/30/2014   Procedure: Right shoulder arthroscopy rotator cuff repair, subacromial decompression;  Surgeon: Tania Ade, MD;  Location: [facility];  Service: Orthopedics;  Laterality: Right;  Right shoulder arthrscopy rotator cuff repair, subacromial decompression    Current Medications: Outpatient Medications Prior to Visit  Medication Sig Dispense Refill  . rivaroxaban (XARELTO) 20 MG TABS tablet Take 1 tablet (20 mg total) by mouth daily with  supper. 90 tablet 3  . amoxicillin-clavulanate (AUGMENTIN) 875-125 MG tablet Take 1 tablet by mouth 2 (two) times daily. One po bid x 7 days 14 tablet 0  . benzonatate (TESSALON) 100 MG capsule Take 1-2 capsules (100-200 mg total) by mouth every 8 (eight) hours. 21 capsule 0  . XARELTO 20 MG TABS tablet TAKE 1 TABLET BY MOUTH  DAILY WITH SUPPER 90 tablet 1   No facility-administered medications prior to visit.      Allergies:   Feldene [piroxicam]   Social History   Social History  . Marital status: Married    Spouse name: N/A  . Number of children: N/A  . Years of education: N/A   Social History Main Topics  . Smoking  status: Former Smoker    Types: Cigarettes    Quit date: 03/25/1999  . Smokeless tobacco: Never Used  . Alcohol use No  . Drug use: No  . Sexual activity: Not Asked   Other Topics Concern  . None   Social History Narrative  . None     Family History:  The patient's family history includes Alcohol abuse in his father and mother; Colon cancer in his father; Stroke in his maternal grandfather.      ROS:   Please see the history of present illness.    ROS All other systems reviewed and are negative.   PHYSICAL EXAM:   VS:  BP 118/90 (BP Location: Right Arm, Patient Position: Sitting, Cuff Size: Large)   Pulse (!) 57   Ht 5\' 10"  (1.778 m)   Wt 220 lb 12.8 oz (100.2 kg)   SpO2 94%   BMI 31.68 kg/m    GEN: Well nourished, well developed, in no acute distress  HEENT: normal  Neck: no JVD, carotid bruits, or masses Cardiac: RRR; no murmurs, rubs, or gallops,no edema  Respiratory:  clear to auscultation bilaterally, normal work of breathing GI: soft, nontender, nondistended, + BS MS: no deformity or atrophy  Skin: warm and dry, no rash Neuro:  Alert and Oriented x 3, Strength and sensation are intact Psych: euthymic mood, full affect    Wt Readings from Last 3 Encounters:  08/05/16 220 lb 12.8 oz (100.2 kg)  06/18/16 218 lb (98.9 kg)  04/21/16 228 lb 12 oz (103.8 kg)      Studies/Labs Reviewed:   EKG:  EKG is ordered today.  The ekg ordered today demonstrates sinus bradycardia HR 57  Recent Labs: 03/07/2016: ALT 32; BUN 10; Creat 0.97; Hemoglobin 15.3; Platelets 293; Potassium 4.5; Sodium 139; TSH 1.49   Lipid Panel    Component Value Date/Time   CHOL 151 11/02/2015 0804   TRIG 42 11/02/2015 0804   HDL 53 11/02/2015 0804   CHOLHDL 2.8 11/02/2015 0804   VLDL 8 11/02/2015 0804   LDLCALC 90 11/02/2015 0804    Additional studies/ records that were reviewed today include:  ECHO: 08/21/13 - Left ventricle: The cavity size was normal. There was mild concentric  hypertrophy. Systolic function was normal. The estimated ejection fraction was in the range of 60% to 65%. Wall motion was normal; there were no regional wall motion abnormalities. - Mitral valve: There was systolic anterior motion. There was mild regurgitation. - Right ventricle: The cavity size was mildly dilated. Wall thickness was normal. - Right atrium: The atrium was mildly dilated.   ASSESSMENT & PLAN:   Chest pain: atypical but will get a exercise myoview for further investigation  PAF: maintaining sinus bradycardia. Continue Xarelto 20mg   daily CHADSVASC of at least 2 (age, CVA)  Hx of CVA: continue Xarelto   Episodic hypotension: has been told to liberalize salt and water in past. BP stable currently   Out of body feeling and left arm weakness: similar to sx prior to CVA in past. I tested his strength in office today, which was normal.  I have asked him to see his PCP ASAP  Medication Adjustments/Labs and Tests Ordered: Current medicines are reviewed at length with the patient today.  Concerns regarding medicines are outlined above.  Medication changes, Labs and Tests ordered today are listed in the Patient Instructions below. Patient Instructions  Medication Instructions:  Your physician recommends that you continue on your current medications as directed. Please refer to the Current Medication list given to you today.   Labwork: None ordered  Testing/Procedures: Your physician has requested that you have en exercise stress myoview. For further information please visit [email or web address]. Please follow instruction sheet, as given.    Follow-Up: Your physician recommends that you schedule a follow-up appointment in: 3 MONTHS WITH DR. Marlou Porch    Any Other Special Instructions Will Be Listed Below (If Applicable).  Exercise Stress Electrocardiogram An exercise stress electrocardiogram is a test to check how blood flows to your heart. It is done to find areas of poor  blood flow. You will need to walk on a treadmill for this test. The electrocardiogram will record your heartbeat when you are at rest and when you are exercising. What happens before the procedure?  Do not have drinks with caffeine or foods with caffeine for 24 hours before the test, or as told by your doctor. This includes coffee, tea (even decaf tea), sodas, chocolate, and cocoa.  Follow your doctor's instructions about eating and drinking before the test.  Ask your doctor what medicines you should or should not take before the test. Take your medicines with water unless told by your doctor not to.  If you use an inhaler, bring it with you to the test.  Bring a snack to eat after the test.  Do not  smoke for 4 hours before the test.  Do not put lotions, powders, creams, or oils on your chest before the test.  Wear comfortable shoes and clothing. What happens during the procedure?  You will have patches put on your chest. Small areas of your chest may need to be shaved. Wires will be connected to the patches.  Your heart rate will be watched while you are resting and while you are exercising.  You will walk on the treadmill. The treadmill will slowly get faster to raise your heart rate.  The test will take about 1-2 hours. What happens after the procedure?  Your heart rate and blood pressure will be watched after the test.  You may return to your normal diet, activities, and medicines or as told by your doctor. This information is not intended to replace advice given to you by your health care provider. Make sure you discuss any questions you have with your health care provider. Document Released: 08/27/2007 Document Revised: 11/07/2015 Document Reviewed: 11/15/2012 Elsevier Interactive Patient Education  2017 [employer].    If you need a refill on your cardiac medications before your next appointment, please call your pharmacy.      Mable Fill, PA-C    08/05/2016 11:20 AM    [facility] [place], [place], [place]  [address] Phone: [phone]; Fax: [phone]

## 2016-08-05 ENCOUNTER — Encounter: Payer: Self-pay | Admitting: Physician Assistant

## 2016-08-05 ENCOUNTER — Ambulatory Visit (INDEPENDENT_AMBULATORY_CARE_PROVIDER_SITE_OTHER): Payer: 59 | Admitting: Physician Assistant

## 2016-08-05 VITALS — BP 118/90 | HR 57 | Ht 70.0 in | Wt 220.8 lb

## 2016-08-05 DIAGNOSIS — I950 Idiopathic hypotension: Secondary | ICD-10-CM

## 2016-08-05 DIAGNOSIS — Z7901 Long term (current) use of anticoagulants: Secondary | ICD-10-CM

## 2016-08-05 DIAGNOSIS — I480 Paroxysmal atrial fibrillation: Secondary | ICD-10-CM | POA: Diagnosis not present

## 2016-08-05 DIAGNOSIS — Z8673 Personal history of transient ischemic attack (TIA), and cerebral infarction without residual deficits: Secondary | ICD-10-CM

## 2016-08-05 DIAGNOSIS — R079 Chest pain, unspecified: Secondary | ICD-10-CM | POA: Diagnosis not present

## 2016-08-05 NOTE — Patient Instructions (Addendum)
Medication Instructions:  Your physician recommends that you continue on your current medications as directed. Please refer to the Current Medication list given to you today.   Labwork: None ordered  Testing/Procedures: Your physician has requested that you have en exercise stress myoview. For further information please visit [email or web address]. Please follow instruction sheet, as given.    Follow-Up: Your physician recommends that you schedule a follow-up appointment in: 3 MONTHS WITH DR. Marlou Porch    Any Other Special Instructions Will Be Listed Below (If Applicable).  Exercise Stress Electrocardiogram An exercise stress electrocardiogram is a test to check how blood flows to your heart. It is done to find areas of poor blood flow. You will need to walk on a treadmill for this test. The electrocardiogram will record your heartbeat when you are at rest and when you are exercising. What happens before the procedure?  Do not have drinks with caffeine or foods with caffeine for 24 hours before the test, or as told by your doctor. This includes coffee, tea (even decaf tea), sodas, chocolate, and cocoa.  Follow your doctor's instructions about eating and drinking before the test.  Ask your doctor what medicines you should or should not take before the test. Take your medicines with water unless told by your doctor not to.  If you use an inhaler, bring it with you to the test.  Bring a snack to eat after the test.  Do not  smoke for 4 hours before the test.  Do not put lotions, powders, creams, or oils on your chest before the test.  Wear comfortable shoes and clothing. What happens during the procedure?  You will have patches put on your chest. Small areas of your chest may need to be shaved. Wires will be connected to the patches.  Your heart rate will be watched while you are resting and while you are exercising.  You will walk on the treadmill. The treadmill will slowly get  faster to raise your heart rate.  The test will take about 1-2 hours. What happens after the procedure?  Your heart rate and blood pressure will be watched after the test.  You may return to your normal diet, activities, and medicines or as told by your doctor. This information is not intended to replace advice given to you by your health care provider. Make sure you discuss any questions you have with your health care provider. Document Released: 08/27/2007 Document Revised: 11/07/2015 Document Reviewed: 11/15/2012 Elsevier Interactive Patient Education  2017 [employer].    If you need a refill on your cardiac medications before your next appointment, please call your pharmacy.

## 2016-08-06 ENCOUNTER — Telehealth (HOSPITAL_COMMUNITY): Payer: Self-pay | Admitting: *Deleted

## 2016-08-06 NOTE — Telephone Encounter (Signed)
Patient's wife given detailed instructions per Myocardial Perfusion Study Information Sheet for the test on 08/08/16 at 7:45. Patient notified to arrive 15 minutes early and that it is imperative to arrive on time for appointment to keep from having the test rescheduled.  If you need to cancel or reschedule your appointment, please call the office within 24 hours of your appointment. Failure to do so may result in a cancellation of your appointment, and a $50 no show fee. Patient verbalized understanding.[name]

## 2016-08-08 ENCOUNTER — Ambulatory Visit (HOSPITAL_COMMUNITY): Payer: 59 | Attending: Cardiology

## 2016-08-08 ENCOUNTER — Encounter: Payer: Self-pay | Admitting: Physician Assistant

## 2016-08-08 ENCOUNTER — Ambulatory Visit (INDEPENDENT_AMBULATORY_CARE_PROVIDER_SITE_OTHER): Payer: 59 | Admitting: Physician Assistant

## 2016-08-08 VITALS — BP 127/85 | HR 71 | Wt 218.0 lb

## 2016-08-08 DIAGNOSIS — R079 Chest pain, unspecified: Secondary | ICD-10-CM | POA: Insufficient documentation

## 2016-08-08 DIAGNOSIS — R531 Weakness: Secondary | ICD-10-CM | POA: Diagnosis not present

## 2016-08-08 DIAGNOSIS — R5383 Other fatigue: Secondary | ICD-10-CM | POA: Diagnosis not present

## 2016-08-08 DIAGNOSIS — Z8673 Personal history of transient ischemic attack (TIA), and cerebral infarction without residual deficits: Secondary | ICD-10-CM | POA: Diagnosis not present

## 2016-08-08 DIAGNOSIS — I4891 Unspecified atrial fibrillation: Secondary | ICD-10-CM

## 2016-08-08 DIAGNOSIS — R5381 Other malaise: Secondary | ICD-10-CM

## 2016-08-08 LAB — MYOCARDIAL PERFUSION IMAGING
CSEPHR: 92 %
Estimated workload: 12.1 METS
Exercise duration (min): 10 min
Exercise duration (sec): 15 s
LV dias vol: 92 mL (ref 62–150)
LVSYSVOL: 37 mL
MPHR: 155 {beats}/min
NUC STRESS TID: 0.77
Peak HR: 144 {beats}/min
RATE: 0.33
Rest HR: 49 {beats}/min
SDS: 3
SRS: 1
SSS: 4

## 2016-08-08 MED ORDER — TECHNETIUM TC 99M TETROFOSMIN IV KIT
33.0000 | PACK | Freq: Once | INTRAVENOUS | Status: AC | PRN
  Administered 2016-08-08: 33 via INTRAVENOUS
  Filled 2016-08-08: qty 33

## 2016-08-08 MED ORDER — TECHNETIUM TC 99M TETROFOSMIN IV KIT
10.5000 | PACK | Freq: Once | INTRAVENOUS | Status: AC | PRN
  Administered 2016-08-08: 10.5 via INTRAVENOUS
  Filled 2016-08-08: qty 11

## 2016-08-08 NOTE — Patient Instructions (Addendum)
Tumeric 500mg  twice a day.

## 2016-08-08 NOTE — Progress Notes (Signed)
   Subjective:    Patient ID: Stephen King, male    DOB: [birth year], 66 y.o.   MRN: [id number]  HPI  Pt is a 66 yo male with PMH of CVA, atrial fibrillation who presents to the clinic with episodic weakness and palpitations episodes since January. He has had 3 episodes. The longest lasted 1hr and a half. He is not on metoprolol because BP went too low on it and made him feel dizzy. He is not on a statin due to LDL being great. When he has episodes he admits he is SOB and upper body is very weak. He has seen cardiology and myoview stress test was ordered. He does not know his test results yet. He continually feels tired with no energy. He was previously very active.    Review of Systems    see HPI.  Objective:   Physical Exam  Constitutional: He is oriented to person, place, and time. He appears well-developed and well-nourished.  HENT:  Head: Normocephalic and atraumatic.  Neck: No JVD present. No tracheal deviation present.  Cardiovascular: Normal rate, regular rhythm and normal heart sounds.   No bruits.   Pulmonary/Chest: Effort normal and breath sounds normal. He has no wheezes.  Neurological: He is alert and oriented to person, place, and time. He has normal reflexes. He displays normal reflexes. No cranial nerve deficit. He exhibits normal muscle tone. Coordination normal.  Upper and lower extremity 5/5 strength.   Psychiatric: He has a normal mood and affect. His behavior is normal.          Assessment & Plan:  .[name] was seen today for sob, chest pain.  Diagnoses and all orders for this visit:  Weakness -     US Carotid Duplex Bilateral; Future -     MR BRAIN W WO CONTRAST -     US Carotid Duplex Bilateral  History of CVA (cerebrovascular accident) -     US Carotid Duplex Bilateral; Future -     MR BRAIN W WO CONTRAST -     US Carotid Duplex Bilateral  No energy -     VITAMIN D 25 Hydroxy (Vit-D Deficiency, Fractures) -     Vitamin B12 -      Testosterone  Malaise -     VITAMIN D 25 Hydroxy (Vit-D Deficiency, Fractures) -     Vitamin B12 -     Testosterone  Atrial fibrillation with RVR (HCC) -     US Carotid Duplex Bilateral; Future -     US Carotid Duplex Bilateral   No focal neurological findings.  Always a concern for TIA due to hx. Will get carotid dopplers to assess risk. If there is plaque build up discussed starting a statin despite good LDL's.  Due to weakness and hx of CVA I would like an MRI of brain to look for any changes.  Will look labs for fatigue.  I feel like episodes represent patient going into abnormal rhythm and then symptoms improve once he converts back. We may need cardiology to consider antiarrythmic that he can tolerate.

## 2016-08-09 LAB — VITAMIN D 25 HYDROXY (VIT D DEFICIENCY, FRACTURES): Vit D, 25-Hydroxy: 20 ng/mL — ABNORMAL LOW (ref 30–100)

## 2016-08-09 LAB — TESTOSTERONE: TESTOSTERONE: 360 ng/dL (ref 250–827)

## 2016-08-09 LAB — VITAMIN B12: Vitamin B-12: 347 pg/mL (ref 200–1100)

## 2016-08-11 DIAGNOSIS — R5383 Other fatigue: Secondary | ICD-10-CM | POA: Insufficient documentation

## 2016-08-11 DIAGNOSIS — R5381 Other malaise: Secondary | ICD-10-CM | POA: Insufficient documentation

## 2016-08-11 NOTE — Progress Notes (Signed)
Call pt: b12 low normal. I would start 1069mcg supplement.  Vitamin D low start D3 1000 units.  Testosterone normal but on low side.consider recheck if energy does not improve with supplements.

## 2016-08-19 ENCOUNTER — Other Ambulatory Visit: Payer: Self-pay | Admitting: Physician Assistant

## 2016-08-19 DIAGNOSIS — I480 Paroxysmal atrial fibrillation: Secondary | ICD-10-CM

## 2016-08-19 DIAGNOSIS — Z8673 Personal history of transient ischemic attack (TIA), and cerebral infarction without residual deficits: Secondary | ICD-10-CM

## 2016-08-19 DIAGNOSIS — R531 Weakness: Secondary | ICD-10-CM

## 2016-08-29 ENCOUNTER — Ambulatory Visit (HOSPITAL_COMMUNITY)
Admission: RE | Admit: 2016-08-29 | Discharge: 2016-08-29 | Disposition: A | Discharge status: Home / Self Care | Payer: 59 | Source: Ambulatory Visit | Attending: Cardiology | Admitting: Cardiology

## 2016-08-29 DIAGNOSIS — R42 Dizziness and giddiness: Secondary | ICD-10-CM | POA: Diagnosis not present

## 2016-08-29 DIAGNOSIS — R531 Weakness: Secondary | ICD-10-CM | POA: Diagnosis present

## 2016-08-29 DIAGNOSIS — I6523 Occlusion and stenosis of bilateral carotid arteries: Secondary | ICD-10-CM | POA: Diagnosis not present

## 2016-08-29 DIAGNOSIS — Z8673 Personal history of transient ischemic attack (TIA), and cerebral infarction without residual deficits: Secondary | ICD-10-CM

## 2016-11-15 ENCOUNTER — Other Ambulatory Visit: Payer: Self-pay | Admitting: Physician Assistant

## 2016-11-18 ENCOUNTER — Encounter: Payer: Self-pay | Admitting: Cardiology

## 2016-11-18 ENCOUNTER — Ambulatory Visit (INDEPENDENT_AMBULATORY_CARE_PROVIDER_SITE_OTHER): Payer: 59 | Admitting: Cardiology

## 2016-11-18 VITALS — BP 122/74 | HR 76 | Ht 70.0 in | Wt 219.8 lb

## 2016-11-18 DIAGNOSIS — Z8673 Personal history of transient ischemic attack (TIA), and cerebral infarction without residual deficits: Secondary | ICD-10-CM

## 2016-11-18 DIAGNOSIS — Z7901 Long term (current) use of anticoagulants: Secondary | ICD-10-CM

## 2016-11-18 DIAGNOSIS — I480 Paroxysmal atrial fibrillation: Secondary | ICD-10-CM | POA: Diagnosis not present

## 2016-11-18 MED ORDER — RIVAROXABAN 20 MG PO TABS: 20.0000 mg | ORAL_TABLET | Freq: Every day | ORAL | 3 refills | Status: DC

## 2016-11-18 MED ORDER — METOPROLOL TARTRATE 25 MG PO TABS: 25.0000 mg | ORAL_TABLET | Freq: Two times a day (BID) | ORAL | 11 refills | Status: DC | PRN

## 2016-11-18 NOTE — Patient Instructions (Signed)
Medication Instructions:  Your physician has recommended you make the following change in your medication: 1.) START Metoprolol 25 mg twice a day as needed.   Labwork: None Ordered   Testing/Procedures: None Ordered  Follow-Up: Your physician wants you to follow-up in: 1 year with Dr. Marlou Porch. You will receive a reminder letter in the mail two months in advance. If you don't receive a letter, please call our office to schedule the follow-up appointment.   Any Other Special Instructions Will Be Listed Below (If Applicable).     If you need a refill on your cardiac medications before your next appointment, please call your pharmacy.

## 2016-11-18 NOTE — Progress Notes (Signed)
[facility]. [address] [place], [place]  [address] Phone: [phone] Fax:  [phone]  Date:  11/18/2016   ID:  LAUTARO KORAL, DOB 1950-05-09, MRN [id number]  PCP:  Donella Stade, PA-C   History of Present Illness: Stephen King is a 66 y.o. male here for follow up of atrial fibrillation with rapid ventricular response following hospitalization on 08/22/13. Stroke in 2011 and presented with substernal chest pain radiating to his back lasting approximately 1-1/2 hours associated with shortness of breath lightheadedness, nausea rated as a 10 over 10 at its worst. Initial troponin was normal. He was placed on anticoagulation. Echocardiogram showed normal ejection fraction with no wall motion abnormalities. He was observed on telemetry and converted.   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 Also went to [place], cruise.  He's had no alcohol use, no fevers, no surgeries, no thyroid issues. No amphetamines. He has rare bouts of atrial fibrillation.  Rare bouts of dizziness, hypotension. Blood pressure has been soft in the past. He is on no antihypertensives.  Had a bad head cold, sinusitis in March 2018. Did not trigger atrial fibrillation. Has not had to take any when necessary metoprolol in quite some time. He continues to take Xarelto without any difficulty, no bleeding.    Wt Readings from Last 3 Encounters:  11/18/16 219 lb 12.8 oz (99.7 kg)  08/08/16 218 lb (98.9 kg)  08/05/16 220 lb 12.8 oz (100.2 kg)     Past Medical History:  Diagnosis Date  . Arthritis   . Dysrhythmia    PAF  . Hernia, umbilical   . Stroke ([facility]) 10/11    Past Surgical History:  Procedure Laterality Date  . c6-7 fusion  10/2005  . cataracts    . HEMORROIDECTOMY    . neck fusion    . SHOULDER ARTHROSCOPY WITH ROTATOR CUFF REPAIR Right 10/30/2014   Procedure: SHOULDER ARTHROSCOPY WITH ROTATOR CUFF REPAIR;  Surgeon: Tania Ade, MD;  Location: [facility];  Service: Orthopedics;  Laterality: Right;  . SHOULDER ARTHROSCOPY WITH SUBACROMIAL DECOMPRESSION Right 10/30/2014   Procedure: Right shoulder arthroscopy rotator cuff repair, subacromial decompression;  Surgeon: Tania Ade, MD;  Location: [facility];  Service: Orthopedics;  Laterality: Right;  Right shoulder arthrscopy rotator cuff repair, subacromial decompression    Current Outpatient Prescriptions  Medication Sig Dispense Refill  . rivaroxaban (XARELTO) 20 MG TABS tablet Take 1 tablet (20 mg total) by mouth daily with supper. 90 tablet 3  . metoprolol tartrate (LOPRESSOR) 25 MG tablet Take 1 tablet (25 mg total) by mouth 2 (two) times daily as needed. 30 tablet 11   No current facility-administered medications for this visit.     Allergies:    Allergies  Allergen Reactions  . Feldene [Piroxicam] Hives    Social History:  The patient  reports that he quit smoking about 17 years ago. His smoking use included Cigarettes. He has never used smokeless tobacco. He reports that he does not drink alcohol or use drugs.   Family History  Problem Relation Age of Onset  . Alcohol abuse Mother   . Colon cancer Father   . Alcohol abuse Father   . Stroke Maternal Grandfather     ROS:  Please see the history of present illness.   No strokelike symptoms, no fevers, no chills, no orthopnea, no PND  PHYSICAL EXAM: VS:  BP 122/74   Pulse 76  Ht 5\' 10"  (1.778 m)   Wt 219 lb 12.8 oz (99.7 kg)   BMI 31.54 kg/m  Well nourished, well developed, in no acute distress HEENT: [place]/AT, EOMI, right parotid mass. Rosacea Neck: no JVD, normal carotid upstroke, no bruit Cardiac:  normal S1, S2; RRR; no murmur Lungs:  clear to auscultation bilaterally, no wheezing, rhonchi or rales Abd: soft, nontender, no hepatomegaly, no bruitsoverweight Ext: no edema, 2+ distal pulses Skin: warm and dry GU: deferred Neuro: no focal abnormalities noted, AAO x 3  EKG:  EKG - 10/23/15-sinus  rhythm, sinus arrhythmia heart rate 70 bpm, no other abnormalities. 04/04/14-sinus rhythm, 63, no other abnormalities-previous 08/21/13-sinus rhythm, no other abnormalities 08/20/13-atrial fibrillation heart rate 165 with nonspecific ST-T wave changes   ECHO: 08/21/13 - Left ventricle: The cavity size was normal. There was mild concentric hypertrophy. Systolic function was normal. The estimated ejection fraction was in the range of 60% to 65%. Wall motion was normal; there were no regional wall motion abnormalities. - Mitral valve: There was systolic anterior motion. There was mild regurgitation. - Right ventricle: The cavity size was mildly dilated. Wall thickness was normal. - Right atrium: The atrium was mildly dilated.  Nuclear stress test 08/08/16-atypical chest pain  Nuclear stress EF: 60%.  Blood pressure demonstrated a normal response to exercise.  There was no ST segment deviation noted during stress.  The left ventricular ejection fraction is normal (55-65%).  Defect 1: There is a small defect of mild severity present in the mid inferior location.  This is a low risk study.   Low risk stress nuclear study with mild inferior thinning but no ischemia or infarction; EF 60 with normal wall motion.     Labs: 08/21/13-troponin normal, hemoglobin A1c 5.1, TSH 1.3, creatinine 0.9, LDL 67, hemoglobin 12.8  ASSESSMENT AND PLAN:   1. Paroxysmal atrial fibrillation with rapid ventricular response - currently sinus rhythm. Low-dose metoprolol, when necessary. I went ahead and refilled this. 25 mg twice a day when necessary.  2. Anticoagulation- Xarelto. CHADS-VASc (CVA-2) expressed the importance of anticoagulation. He understands. Continue. No signs of bleeding. Previous blood work reviewed and unremarkable.  3. Atypical chest pain-nuclear stress test on 08/08/16 was low risk but no ischemia. Normal. 4. 38-month followup  Signed, Candee Furbish, MD [facility]  11/18/2016 9:37 AM

## 2017-03-26 ENCOUNTER — Other Ambulatory Visit: Payer: Self-pay | Admitting: Cardiology

## 2017-03-26 MED ORDER — RIVAROXABAN 20 MG PO TABS: 20.0000 mg | ORAL_TABLET | Freq: Every day | ORAL | 0 refills | Status: DC

## 2017-03-26 NOTE — Telephone Encounter (Signed)
°*  STAT* If patient is at the pharmacy, call can be transferred to refill team.   1. Which medications need to be refilled? (please list name of each medication and dose if known) rivaroxaban (XARELTO) 20 MG TABS tablet  2. Which pharmacy/location (including street and city if local pharmacy) is medication to be sent to? Medicare RX prescription drug coverage  [phone]  3. Do they need a 30 day or 90 day supply? [place]

## 2017-03-26 NOTE — Telephone Encounter (Signed)
Pt returned call to clinic states he just went onto Medicare and they told him his mail order pharmacy was [facility].  Advised pt will send in 3 month supply, and when he sees Dr Marlou Porch in February we will need to get blood work at that time to follow up on Xarelto dosage to ensure 20mg  QD is still appropriate dosage for pt.

## 2017-03-26 NOTE — Telephone Encounter (Signed)
Pt last saw Dr Marlou Porch 11/18/16, pt is overdue for lab work last labs 03/07/16 Creat 0.97.  Pt has upcoming appt with Dr Marlou Porch on 04/28/17 placed a not on appt overdue for follow-up Xarelto blood work NEEDS CBC and BMP at [facility].  Age 67, weight 99.7kg, CrCl based on previous labwork is 107.07, Xarelto 20mg  QD is appropriate dosing based on this CrCl.  Will refill Xarelto x 1 refill and await lab work results from blood work to be done at [facility] with Dr Marlou Porch on 04/28/17

## 2017-04-13 ENCOUNTER — Encounter: Payer: Self-pay | Admitting: *Deleted

## 2017-04-28 ENCOUNTER — Ambulatory Visit: Payer: Medicare Other | Admitting: Cardiology

## 2017-04-28 ENCOUNTER — Encounter: Payer: Self-pay | Admitting: Cardiology

## 2017-04-28 VITALS — BP 94/60 | HR 71 | Ht 70.0 in | Wt 219.4 lb

## 2017-04-28 DIAGNOSIS — Z7901 Long term (current) use of anticoagulants: Secondary | ICD-10-CM | POA: Diagnosis not present

## 2017-04-28 DIAGNOSIS — I480 Paroxysmal atrial fibrillation: Secondary | ICD-10-CM

## 2017-04-28 LAB — BASIC METABOLIC PANEL
BUN/Creatinine Ratio: 10 (ref 10–24)
BUN: 10 mg/dL (ref 8–27)
CHLORIDE: 103 mmol/L (ref 96–106)
CO2: 24 mmol/L (ref 20–29)
Calcium: 8.9 mg/dL (ref 8.6–10.2)
Creatinine, Ser: 0.96 mg/dL (ref 0.76–1.27)
GFR, EST AFRICAN AMERICAN: 95 mL/min/{1.73_m2} (ref >59)
GFR, EST NON AFRICAN AMERICAN: 83 mL/min/{1.73_m2} (ref >59)
Glucose: 89 mg/dL (ref 65–99)
POTASSIUM: 4.6 mmol/L (ref 3.5–5.2)
SODIUM: 141 mmol/L (ref 134–144)

## 2017-04-28 LAB — CBC
Hematocrit: 42.9 % (ref 37.5–51.0)
Hemoglobin: 15.3 g/dL (ref 13.0–17.7)
MCH: 32.8 pg (ref 26.6–33.0)
MCHC: 35.7 g/dL (ref 31.5–35.7)
MCV: 92 fL (ref 79–97)
PLATELETS: 247 10*3/uL (ref 150–379)
RBC: 4.67 x10E6/uL (ref 4.14–5.80)
RDW: 13.4 % (ref 12.3–15.4)
WBC: 6.1 10*3/uL (ref 3.4–10.8)

## 2017-04-28 NOTE — Patient Instructions (Signed)
Medication Instructions:  The current medical regimen is effective;  continue present plan and medications.  Labwork: Please have blood work today (BMP,CBC).  Follow-Up: Follow up in 1 year with Dr. Marlou Porch.  You will receive a letter in the mail 2 months before you are due.  Please call us when you receive this letter to schedule your follow up appointment.  If you need a refill on your cardiac medications before your next appointment, please call your pharmacy.  Thank you for choosing [facility]!!

## 2017-04-28 NOTE — Progress Notes (Signed)
[facility]. [address], [address] [place], [place]  [address] Phone: [phone] Fax:  [phone]  Date:  04/28/2017   ID:  Stephen King, DOB 1951/01/08, MRN [id number]  PCP:  Donella Stade, PA-C   History of Present Illness: Stephen King is a 67 y.o. male here for follow up of atrial fibrillation with rapid ventricular response following hospitalization on 08/22/13. Stroke in 2011 and presented with substernal chest pain radiating to his back lasting approximately 1-1/2 hours associated with shortness of breath lightheadedness, nausea rated as a 10 over 10 at its worst. Initial troponin was normal. He was placed on anticoagulation. Echocardiogram showed normal ejection fraction with no wall motion abnormalities. He was observed on telemetry and converted.   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 Also went to [place], cruise.  He's had no alcohol use, no fevers, no surgeries, no thyroid issues. No amphetamines. He has rare bouts of atrial fibrillation.  Rare bouts of dizziness, hypotension. Blood pressure has been soft in the past. He is on no antihypertensives, takes metoprolol as needed.   Had a bad head cold, sinusitis in March 2018. Did not trigger atrial fibrillation. Has not had to take any when necessary metoprolol in quite some time. He continues to take Xarelto without any difficulty, no bleeding.  04/28/17 - took metop 01/12/17 - went crazy. Took metop. Went to bed, lay down. Went away after one hour. Also has been having HA, constant. Just moved into new house. No other neuro sequelae.    Wt Readings from Last 3 Encounters:  04/28/17 219 lb 6.4 oz (99.5 kg)  11/18/16 219 lb 12.8 oz (99.7 kg)  08/08/16 218 lb (98.9 kg)     Past Medical History:  Diagnosis Date  . Arthritis   . Dysrhythmia    PAF  . Hernia, umbilical   . Stroke ([facility]) 10/11    Past Surgical History:  Procedure Laterality Date  . c6-7 fusion  10/2005  . cataracts    .  HEMORROIDECTOMY    . neck fusion    . SHOULDER ARTHROSCOPY WITH ROTATOR CUFF REPAIR Right 10/30/2014   Procedure: SHOULDER ARTHROSCOPY WITH ROTATOR CUFF REPAIR;  Surgeon: Tania Ade, MD;  Location: [facility];  Service: Orthopedics;  Laterality: Right;  . SHOULDER ARTHROSCOPY WITH SUBACROMIAL DECOMPRESSION Right 10/30/2014   Procedure: Right shoulder arthroscopy rotator cuff repair, subacromial decompression;  Surgeon: Tania Ade, MD;  Location: [facility];  Service: Orthopedics;  Laterality: Right;  Right shoulder arthrscopy rotator cuff repair, subacromial decompression    Current Outpatient Medications  Medication Sig Dispense Refill  . metoprolol tartrate (LOPRESSOR) 25 MG tablet Take 25 mg by mouth 2 (two) times daily as needed (for irregular heartbeat).    . rivaroxaban (XARELTO) 20 MG TABS tablet Take 1 tablet (20 mg total) by mouth daily with supper. 90 tablet 0   No current facility-administered medications for this visit.     Allergies:    Allergies  Allergen Reactions  . Feldene [Piroxicam] Hives    Social History:  The patient  reports that he quit smoking about 18 years ago. His smoking use included cigarettes. he has never used smokeless tobacco. He reports that he does not drink alcohol or use drugs.   Family History  Problem Relation Age of Onset  . Alcohol abuse Mother   . Colon cancer Father   . Alcohol abuse Father   .  Stroke Maternal Grandfather     ROS:  Please see the history of present illness.   All other ROS neg.   PHYSICAL EXAM: VS:  BP 94/60   Pulse 71   Ht 5\' 10"  (1.778 m)   Wt 219 lb 6.4 oz (99.5 kg)   BMI 31.48 kg/m  Well nourished, well developed, in no acute distress  HEENT: [place]/AT, EOMI, right parotid mass. Rosacea Neck: no JVD, normal carotid upstroke, no bruit Cardiac:  normal S1, S2; RRR; no murmur  Lungs:  clear to auscultation bilaterally, no wheezing, rhonchi or rales  Abd: soft, nontender, no  hepatomegaly, no bruits overweight Ext: no edema, 2+ distal pulses Skin: warm and dry  GU: deferred Neuro: no focal abnormalities noted, AAO x 3  EKG:  EKG - 10/23/15-sinus rhythm, sinus arrhythmia heart rate 70 bpm, no other abnormalities. 04/04/14-sinus rhythm, 63, no other abnormalities-previous 08/21/13-sinus rhythm, no other abnormalities 08/20/13-atrial fibrillation heart rate 165 with nonspecific ST-T wave changes   ECHO: 08/21/13 - Left ventricle: The cavity size was normal. There was mild concentric hypertrophy. Systolic function was normal. The estimated ejection fraction was in the range of 60% to 65%. Wall motion was normal; there were no regional wall motion abnormalities. - Mitral valve: There was systolic anterior motion. There was mild regurgitation. - Right ventricle: The cavity size was mildly dilated. Wall thickness was normal. - Right atrium: The atrium was mildly dilated.  Nuclear stress test 08/08/16-atypical chest pain  Nuclear stress EF: 60%.  Blood pressure demonstrated a normal response to exercise.  There was no ST segment deviation noted during stress.  The left ventricular ejection fraction is normal (55-65%).  Defect 1: There is a small defect of mild severity present in the mid inferior location.  This is a low risk study.   Low risk stress nuclear study with mild inferior thinning but no ischemia or infarction; EF 60 with normal wall motion.     Labs: 08/21/13-troponin normal, hemoglobin A1c 5.1, TSH 1.3, creatinine 0.9, LDL 67, hemoglobin 12.8  ASSESSMENT AND PLAN:   1. Paroxysmal atrial fibrillation with rapid ventricular response - currently sinus rhythm. Low-dose metoprolol, when necessary. I went ahead and refilled this. 25 mg twice a day when necessary. He only needed this once. One episode during his anniversary. Lasted one hour. Did well without going to ER.  2. Anticoagulation- Xarelto. CHADS-VASc (CVA-2) expressed the importance of  anticoagulation. He understands. Continue. No signs of bleeding. Previous blood work reviewed and unremarkable. Checking labs, BMET and CBC. 3. 3-month followup  Signed, Candee Furbish, MD [facility]  04/28/2017 9:29 AM

## 2017-05-17 ENCOUNTER — Other Ambulatory Visit: Payer: Self-pay | Admitting: Cardiology

## 2017-07-27 ENCOUNTER — Encounter: Payer: Self-pay | Admitting: Physician Assistant

## 2017-07-28 ENCOUNTER — Telehealth: Payer: Self-pay | Admitting: Cardiology

## 2017-07-28 ENCOUNTER — Encounter: Payer: Self-pay | Admitting: Physician Assistant

## 2017-07-28 ENCOUNTER — Ambulatory Visit (INDEPENDENT_AMBULATORY_CARE_PROVIDER_SITE_OTHER): Payer: Medicare Other | Admitting: Physician Assistant

## 2017-07-28 ENCOUNTER — Encounter: Payer: Self-pay | Admitting: Cardiology

## 2017-07-28 VITALS — BP 113/73 | HR 153 | Ht 70.0 in | Wt 224.0 lb

## 2017-07-28 DIAGNOSIS — I4892 Unspecified atrial flutter: Secondary | ICD-10-CM | POA: Diagnosis not present

## 2017-07-28 DIAGNOSIS — R0602 Shortness of breath: Secondary | ICD-10-CM | POA: Diagnosis not present

## 2017-07-28 DIAGNOSIS — R0789 Other chest pain: Secondary | ICD-10-CM | POA: Diagnosis not present

## 2017-07-28 DIAGNOSIS — Z8673 Personal history of transient ischemic attack (TIA), and cerebral infarction without residual deficits: Secondary | ICD-10-CM

## 2017-07-28 DIAGNOSIS — R918 Other nonspecific abnormal finding of lung field: Secondary | ICD-10-CM | POA: Diagnosis not present

## 2017-07-28 DIAGNOSIS — Z888 Allergy status to other drugs, medicaments and biological substances status: Secondary | ICD-10-CM | POA: Diagnosis not present

## 2017-07-28 DIAGNOSIS — Z7901 Long term (current) use of anticoagulants: Secondary | ICD-10-CM | POA: Diagnosis not present

## 2017-07-28 DIAGNOSIS — I480 Paroxysmal atrial fibrillation: Secondary | ICD-10-CM

## 2017-07-28 MED ORDER — NITROGLYCERIN 0.4 MG SL SUBL
0.40 | SUBLINGUAL_TABLET | SUBLINGUAL | Status: DC
Start: ? — End: 2017-07-28

## 2017-07-28 NOTE — Telephone Encounter (Signed)
New Message   Patients wife is calling on behalf of spouse. He is currently at the emergency room in [place]. She indicates that they do not have a cardiac until there. At this time they are unsure what to do and would like some insight. He was ordinally at the PCP office for AFIB and they sent him to the Emergency room. They would like for him to be moved to [facility]. Please call for assistance.

## 2017-07-28 NOTE — Telephone Encounter (Signed)
New Message:      Langley Gauss calling from [facility] ER. Langley Gauss states that pt needs to be seen in the next 48 hrs for paroxysmal atrial fibulation.

## 2017-07-28 NOTE — Telephone Encounter (Signed)
Spoke with pt's wife regarding his current admission to [facility] free standing ER. She states he was went to his PCP who determined he was in [place] with a high HR (155 per PA's OV note). She was concerned about her husband being admitted to [facility] vs [facility]. I told her I could not advise her as to where or if he will be admitted, but he will want to follow up with Dr Marlou Porch when he is discharged. She verbalized understanding and will call the office when given further instructions.

## 2017-07-28 NOTE — Telephone Encounter (Signed)
Informed patient has an appt for next Tuesday and that pt is aware also.

## 2017-07-28 NOTE — Progress Notes (Signed)
   Subjective:    Patient ID: Stephen King, male    DOB: April 19, 1950, 67 y.o.   MRN: [id number]  HPI  Pt is a 67 yo male with hx of stroke and atrial fibrillation who is on xaralto and presents to the clinic with chest pain, weakness, tachycardia since last night. He took a metoprolol and went to bed last night but continue to wake up in discomfort. He drove himself here today but continues to have left to mid chest pain, left arm weakness. CP worsens with exertion and improves some with rest but does not resolve.   .. Active Ambulatory Problems    Diagnosis Date Noted  . PARESTHESIA 12/24/2010  . HEAD TRAUMA, CLOSED 12/24/2010  . Stroke ([facility]) 12/22/2009  . 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 Urgency of urination 08/26/2011  . Mass of right side of neck 06/06/2013  . Chest pain 08/21/2013  . Atrial fibrillation with RVR ([facility]) 08/21/2013  . 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 Atrial fibrillation ([facility]) 08/29/2013  . Atypical nevus 08/29/2013  . History of CVA (cerebrovascular accident) 10/06/2013  . Chronic anticoagulation 10/06/2013  . Primary osteoarthritis of both knees 10/07/2013  . 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 Right shoulder pain 11/04/2013  . Acute bronchitis 10/19/2014  . Pre-operative cardiovascular examination 10/27/2014  . History of stroke 10/27/2014  . left knee pain 10/26/2015  . Multiple joint pain 03/10/2016  . Weakness 03/10/2016  . Myalgia 03/10/2016  . No energy 08/11/2016  . Malaise 08/11/2016  . Atrial flutter ([facility]) 07/28/2017   Resolved Ambulatory Problems    Diagnosis Date Noted  . Osteoarthritis of left knee 10/26/2015   Past Medical History:  Diagnosis Date  . Arthritis   . Dysrhythmia   . Hernia, umbilical   . Stroke ([facility]) 10/11      Review of Systems  All other systems reviewed and are negative.      Objective:   Physical Exam  Constitutional: He is oriented to person, place, and time. He appears well-developed and well-nourished.   HENT:  Head: Normocephalic and atraumatic.  Right Ear: External ear normal.  Cardiovascular:  Irregular irregular rhythm.   Pulmonary/Chest: Effort normal and breath sounds normal.  Neurological: He is alert and oriented to person, place, and time.  Skin: Skin is dry. No rash noted.  Psychiatric: He has a normal mood and affect. His behavior is normal.          Assessment & Plan:  Marland KitchenMarland KitchenDiagnoses and all orders for this visit:  Atrial flutter, unspecified type ([facility])  Paroxysmal atrial fibrillation ([place]) -     EKG 12-Lead  History of stroke   Pt is weak, with active chest pain.   EKG shows atrial flutter with rate of 155. Non specific questionable inferior ischemia.   EMS called and transported to [facility] hospital to rule out underlying MI and to convert to NSR.   Will alert cardiologist as well.

## 2017-07-29 ENCOUNTER — Encounter: Payer: Self-pay | Admitting: Physician Assistant

## 2017-08-03 NOTE — Progress Notes (Signed)
Cardiology Office Note   Date:  08/04/2017   ID:  Stephen King, DOB [birth year], MRN [id number]  PCP:  Donella Stade, PA-C  Cardiologist:  Dr. Marlou Porch    Chief Complaint  Patient presents with  . Atrial Fibrillation  . Chest Pain      History of Present Illness: Stephen King is a 67 y.o. male who presents for a flutter  He has a hx of atrial fibrillation with rapid ventricular response following hospitalization on 08/22/13. Stroke in 2011 and presented with substernal chest pain radiating to his back lasting approximately 1-1/2 hours associated with shortness of breath lightheadedness, nausea rated as a 10 over 10 at its worst. Initial troponin was normal. He was placed on anticoagulation. Echocardiogram showed normal ejection fraction with no wall motion abnormalities. He was observed on telemetry and converted.  Hx of cardiac cath 2003 with no high grade CAD.  Last nuc in 07/2016 neg for ischemia.    Last visit 04/28/17 - took metop 01/12/17 - went crazy. Took metop. Went to bed, lay down. Went away after one hour. Also has been having HA, constant. Just moved into new house. No other neuro sequelae.   On xarelto for PAF.    On 07/27/17 developed a fib and took metoprolol and went to bed.  The next day he started ok and went to his regular appt but did not feel well there and EKG with a flutter with RVR at 155.  He went to [facility] ER and IV dilt was given.  He converted to SB.   With the a flutter he had chest pain and SOB.  Since being back in SR he still has intermittent chest pressure.  Comes and goes and has not increased with activity.     No a fib since ER visit. No associated symptoms.    Past Medical History:  Diagnosis Date  . Arthritis   . Dysrhythmia    PAF  . Hernia, umbilical   . Stroke ([place]) 10/11    Past Surgical History:  Procedure Laterality Date  . c6-7 fusion  10/2005  . cataracts    . HEMORROIDECTOMY    . neck fusion    . SHOULDER ARTHROSCOPY WITH  ROTATOR CUFF REPAIR Right 10/30/2014   Procedure: SHOULDER ARTHROSCOPY WITH ROTATOR CUFF REPAIR;  Surgeon: Tania Ade, MD;  Location: [facility];  Service: Orthopedics;  Laterality: Right;  . SHOULDER ARTHROSCOPY WITH SUBACROMIAL DECOMPRESSION Right 10/30/2014   Procedure: Right shoulder arthroscopy rotator cuff repair, subacromial decompression;  Surgeon: Tania Ade, MD;  Location: [facility];  Service: Orthopedics;  Laterality: Right;  Right shoulder arthrscopy rotator cuff repair, subacromial decompression     Current Outpatient Medications  Medication Sig Dispense Refill  . XARELTO 20 MG TABS tablet TAKE 1 TABLET BY MOUTH  DAILY WITH SUPPER 90 tablet 1  . metoprolol tartrate (LOPRESSOR) 25 MG tablet Take 0.5 tablets (12.5 mg total) by mouth 2 (two) times daily. 90 tablet 3  . nitroGLYCERIN (NITROSTAT) 0.4 MG SL tablet Place 1 tablet (0.4 mg total) under the tongue every 5 (five) minutes as needed. 25 tablet 3   No current facility-administered medications for this visit.     Allergies:   Feldene [piroxicam]    Social History:  The patient  reports that he quit smoking about 18 years ago. His smoking use included cigarettes. He has never used smokeless tobacco. He reports that he does not drink alcohol or use drugs.  Family History:  The patient's family history includes Alcohol abuse in his father and mother; Arrhythmia in his mother; Colon cancer in his father; Stroke in his maternal grandfather.    ROS:  General:no colds or fevers, no weight changes Skin:no rashes or ulcers HEENT:no blurred vision, no congestion CV:see HPI PUL:see HPI GI:no diarrhea constipation or melena, no indigestion GU:no hematuria, no dysuria MS:no joint pain, no claudication Neuro:no syncope, no lightheadedness Endo:no diabetes, no thyroid disease Wt Readings from Last 3 Encounters:  08/04/17 225 lb (102.1 kg)  07/28/17 224 lb (101.6 kg)  04/28/17 219 lb 6.4 oz  (99.5 kg)     PHYSICAL EXAM: VS:  BP 122/78   Pulse 72   Ht 5\' 10"  (1.778 m)   Wt 225 lb (102.1 kg)   SpO2 96%   BMI 32.28 kg/m  , BMI Body mass index is 32.28 kg/m. General:Pleasant affect, NAD Skin:Warm and dry, brisk capillary refill HEENT:normocephalic, sclera clear, mucus membranes moist Neck:supple, no JVD, no bruits  Heart:S1S2 RRR without murmur, gallup, rub or click Lungs:clear without rales, rhonchi, or wheezes LOV:FIEP, non tender, + BS, do not palpate liver spleen or masses Ext:no lower ext edema, 2+ pedal pulses, 2+ radial pulses Neuro:alert and oriented X 3, MAE, follows commands, + facial symmetry    EKG:  EKG is ordered today. The ekg ordered today demonstrates SR with LA enlargement non specific ST abnormality but no acute changes.   Recent Labs: 04/28/2017: BUN 10; Creatinine, Ser 0.96; Hemoglobin 15.3; Platelets 247; Potassium 4.6; Sodium 141    Lipid Panel    Component Value Date/Time   CHOL 151 11/02/2015 0804   TRIG 42 11/02/2015 0804   HDL 53 11/02/2015 0804   CHOLHDL 2.8 11/02/2015 0804   VLDL 8 11/02/2015 0804   LDLCALC 90 11/02/2015 0804       Other studies Reviewed: Additional studies/ records that were reviewed today include: .  Nuc study 07/2016  Study Highlights     Nuclear stress EF: 60%.  Blood pressure demonstrated a normal response to exercise.  There was no ST segment deviation noted during stress.  The left ventricular ejection fraction is normal (55-65%).  Defect 1: There is a small defect of mild severity present in the mid inferior location.  This is a low risk study.   Low risk stress nuclear study with mild inferior thinning but no ischemia or infarction; EF 60 with normal wall motion.     Reviewed cardia cath 2003.  ASSESSMENT AND PLAN:  1.  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  Converted in ER with Dilt.IV,  Will begin metoprolol 12.5 mg BID for now but may need antiarrythmic.  Had episode in Oct 2018,  April 2019  and May 2019.  He is on Xarelto and has not missed any doses.  2.  Chest pain with the a flutter, but has had freq episodes since back in SR.  Recent nuc was neg for ischemia and troponins at [facility] were neg.  EKG without acute changes.  Discussed with DOD Dr. Curt Bears and will plan for cardiac CTA with FFR.  He will follow up with me in 1-2 weeks.  Will ask them to do test ASAP.  Pt instructed to go to ER is symptoms increase or worsen.     I did give a script for NTG sl prn.    3.  Anticoagulation on xarelto.     Current medicines are reviewed with the patient today.  The patient Has no concerns  regarding medicines.  The following changes have been made:  See above Labs/ tests ordered today include:see above  Disposition:   FU:  see above  Signed, Cecilie Kicks, NP  08/04/2017 10:49 AM    [place], [place], [place], [place] Phone: [phone]; Fax: [phone]

## 2017-08-04 ENCOUNTER — Ambulatory Visit: Payer: Medicare Other | Admitting: Cardiology

## 2017-08-04 ENCOUNTER — Encounter

## 2017-08-04 ENCOUNTER — Encounter: Payer: Self-pay | Admitting: Cardiology

## 2017-08-04 VITALS — BP 122/78 | HR 72 | Ht 70.0 in | Wt 225.0 lb

## 2017-08-04 DIAGNOSIS — R079 Chest pain, unspecified: Secondary | ICD-10-CM

## 2017-08-04 DIAGNOSIS — I4891 Unspecified atrial fibrillation: Secondary | ICD-10-CM | POA: Diagnosis not present

## 2017-08-04 DIAGNOSIS — Z79899 Other long term (current) drug therapy: Secondary | ICD-10-CM | POA: Diagnosis not present

## 2017-08-04 DIAGNOSIS — Z7901 Long term (current) use of anticoagulants: Secondary | ICD-10-CM | POA: Diagnosis not present

## 2017-08-04 MED ORDER — NITROGLYCERIN 0.4 MG SL SUBL: 0.4000 mg | SUBLINGUAL_TABLET | SUBLINGUAL | 3 refills | Status: DC | PRN

## 2017-08-04 MED ORDER — METOPROLOL TARTRATE 25 MG PO TABS: 12.5000 mg | ORAL_TABLET | Freq: Two times a day (BID) | ORAL | 3 refills | Status: DC

## 2017-08-04 NOTE — Patient Instructions (Addendum)
Medication Instructions:  Your physician has recommended you make the following change in your medication: 3 1.  START taking the Metoprolol 1/2 tablet twice a day instead of as needed 2.  START Nitroglycerin 0.4 mg taking as directed on the bottle   Labwork: None ordered  Testing/Procedures: Your physician has requested that you have cardiac CT. Cardiac computed tomography (CT) is a painless test that uses an x-ray machine to take clear, detailed pictures of your heart. For further information please visit HugeFiesta.tn. Please follow instruction sheet as given.    Please arrive at the [facility] main entrance of [facility] at xx:xx AM (30-45 minutes prior to test start time)  [facility] [place], [place] [address] [phone]  Proceed to the [facility] Radiology Department (First Floor).  Please follow these instructions carefully (unless otherwise directed):  Hold all erectile dysfunction medications at least 48 hours prior to test.  On the Night Before the Test: . Drink plenty of water. . Do not consume any caffeinated/decaffeinated beverages or chocolate 12 hours prior to your test. . Do not take any antihistamines 12 hours prior to your test.  On the Day of the Test: . Drink plenty of water. Do not drink any water within one hour of the test. . Do not eat any food 4 hours prior to the test. . You may take your regular medications prior to the test. . IF NOT ON A BETA BLOCKER - Take 50 mg of lopressor (metoprolol) one hour before the test.  After the Test: . Drink plenty of water. . After receiving IV contrast, you may experience a mild flushed feeling. This is normal. . On occasion, you may experience a mild rash up to 24 hours after the test. This is not dangerous. If this occurs, you can take Benadryl 25 mg and increase your fluid intake. . If you experience trouble breathing, this can be serious. If it is severe call 911  IMMEDIATELY. If it is mild, please call our office. . If you take any of these medications: Glipizide/Metformin, Avandament, Glucavance, please do not take 48 hours after completing test.   Follow-Up: Your physician recommends that you schedule a follow-up appointment in: 1-2 WEEKS AFTER THE CT WITH DR. Marlou Porch OR Cecilie Kicks, NPC   Any Other Special Instructions Will Be Listed Below (If Applicable).  CT Scan A CT scan is a kind of X-ray. A CT scan makes pictures of the inside of your body. In this procedure, the pictures will be taken in a large machine that has an opening (CT scanner). What happens before the procedure? Staying hydrated Follow instructions from your doctor about hydration, which may include:  Up to 2 hours before the procedure - you may continue to drink clear liquids. These include water, clear fruit juice, black coffee, and plain tea.  General instructions  Take off any jewelry.  Ask your doctor about changing or stopping your normal medicines. This is important if you take diabetes medicines or blood thinners. What happens during the procedure?  You will lie on a table with your arms above your head.  An IV tube may be put into one of your veins.  Dye may be put into the IV tube. You may feel warm or have a metal taste in your mouth.  The table you will be lying on will move into the CT scanner.  You will be able to see, hear, and talk to the person who  is running the machine while you are in it. Follow that person's directions.  The machine will move around you to take pictures. Do not move.  When the machine is done taking pictures, it will be turned off.  The table will be moved out of the machine.  Your IV tube will be taken out. The procedure may vary among doctors and hospitals. What happens after the procedure?  It is up to you to get your results. Ask when your results will be ready. Summary  A CT scan is a kind of X-ray.  A CT scan  makes pictures of the inside of your body.  Follow instructions from your doctor about eating and drinking before the procedure.  You will be able to see, hear, and talk to the person who is running the machine while you are in it. Follow that person's directions. This information is not intended to replace advice given to you by your health care provider. Make sure you discuss any questions you have with your health care provider. Document Released: 06/06/2008 Document Revised: 03/27/2016 Document Reviewed: 03/27/2016 Elsevier Interactive Patient Education  2017 [employer].     If you need a refill on your cardiac medications before your next appointment, please call your pharmacy.

## 2017-08-05 ENCOUNTER — Other Ambulatory Visit: Payer: Self-pay | Admitting: *Deleted

## 2017-08-05 MED ORDER — METOPROLOL TARTRATE 25 MG PO TABS: 12.5000 mg | ORAL_TABLET | Freq: Two times a day (BID) | ORAL | 3 refills | Status: DC

## 2017-08-05 NOTE — Telephone Encounter (Signed)
Per msg left on the refill vm, [facility] will not refill the rx that was sent in for metoprolol as it was authorized by an NP. They are requesting a physician's approval before that will process the order. I will resend the rx.

## 2017-08-06 ENCOUNTER — Encounter: Payer: Self-pay | Admitting: Physician Assistant

## 2017-08-07 ENCOUNTER — Ambulatory Visit (HOSPITAL_COMMUNITY)
Admission: RE | Admit: 2017-08-07 | Discharge: 2017-08-07 | Disposition: A | Discharge status: Home / Self Care | Payer: Medicare Other | Source: Ambulatory Visit | Attending: Cardiology | Admitting: Cardiology

## 2017-08-07 DIAGNOSIS — I700 Atherosclerosis of aorta: Secondary | ICD-10-CM | POA: Diagnosis not present

## 2017-08-07 DIAGNOSIS — I281 Aneurysm of pulmonary artery: Secondary | ICD-10-CM | POA: Diagnosis not present

## 2017-08-07 DIAGNOSIS — R079 Chest pain, unspecified: Secondary | ICD-10-CM

## 2017-08-07 DIAGNOSIS — I708 Atherosclerosis of other arteries: Secondary | ICD-10-CM | POA: Diagnosis not present

## 2017-08-07 DIAGNOSIS — K760 Fatty (change of) liver, not elsewhere classified: Secondary | ICD-10-CM | POA: Insufficient documentation

## 2017-08-07 MED ORDER — IOPAMIDOL (ISOVUE-370) INJECTION 76%
100.0000 mL | Freq: Once | INTRAVENOUS | Status: AC | PRN
  Administered 2017-08-07: 80 mL via INTRAVENOUS

## 2017-08-07 MED ORDER — NITROGLYCERIN 0.4 MG SL SUBL
0.8000 mg | SUBLINGUAL_TABLET | Freq: Once | SUBLINGUAL | Status: AC
  Administered 2017-08-07: 0.8 mg via SUBLINGUAL

## 2017-08-07 MED ORDER — NITROGLYCERIN 0.4 MG SL SUBL
SUBLINGUAL_TABLET | SUBLINGUAL | Status: AC
  Filled 2017-08-07: qty 2

## 2017-08-07 MED ORDER — IOPAMIDOL (ISOVUE-370) INJECTION 76%
INTRAVENOUS | Status: AC
  Filled 2017-08-07: qty 100

## 2017-08-10 ENCOUNTER — Encounter: Payer: Self-pay | Admitting: Cardiology

## 2017-08-10 ENCOUNTER — Ambulatory Visit: Payer: Medicare Other | Admitting: Cardiology

## 2017-08-10 VITALS — BP 100/68 | HR 53 | Ht 70.0 in | Wt 226.6 lb

## 2017-08-10 DIAGNOSIS — R079 Chest pain, unspecified: Secondary | ICD-10-CM

## 2017-08-10 DIAGNOSIS — Z8673 Personal history of transient ischemic attack (TIA), and cerebral infarction without residual deficits: Secondary | ICD-10-CM

## 2017-08-10 DIAGNOSIS — Z01812 Encounter for preprocedural laboratory examination: Secondary | ICD-10-CM | POA: Diagnosis not present

## 2017-08-10 DIAGNOSIS — I4891 Unspecified atrial fibrillation: Secondary | ICD-10-CM | POA: Diagnosis not present

## 2017-08-10 DIAGNOSIS — I209 Angina pectoris, unspecified: Secondary | ICD-10-CM | POA: Diagnosis not present

## 2017-08-10 LAB — BASIC METABOLIC PANEL
BUN/Creatinine Ratio: 9 — ABNORMAL LOW (ref 10–24)
BUN: 9 mg/dL (ref 8–27)
CO2: 25 mmol/L (ref 20–29)
Calcium: 9.6 mg/dL (ref 8.6–10.2)
Chloride: 103 mmol/L (ref 96–106)
Creatinine, Ser: 1.03 mg/dL (ref 0.76–1.27)
GFR, EST AFRICAN AMERICAN: 87 mL/min/{1.73_m2} (ref >59)
GFR, EST NON AFRICAN AMERICAN: 75 mL/min/{1.73_m2} (ref >59)
Glucose: 88 mg/dL (ref 65–99)
POTASSIUM: 4.6 mmol/L (ref 3.5–5.2)
SODIUM: 141 mmol/L (ref 134–144)

## 2017-08-10 LAB — CBC
HEMATOCRIT: 43.7 % (ref 37.5–51.0)
Hemoglobin: 15.4 g/dL (ref 13.0–17.7)
MCH: 32.9 pg (ref 26.6–33.0)
MCHC: 35.2 g/dL (ref 31.5–35.7)
MCV: 93 fL (ref 79–97)
Platelets: 278 10*3/uL (ref 150–450)
RBC: 4.68 x10E6/uL (ref 4.14–5.80)
RDW: 13.2 % (ref 12.3–15.4)
WBC: 6.2 10*3/uL (ref 3.4–10.8)

## 2017-08-10 NOTE — Patient Instructions (Signed)
Medication Instructions:  The current medical regimen is effective;  continue present plan and medications.  Labwork: Please have blood work today. (CBC, BMP)  Testing/Procedures: Your physician has requested that you have a cardiac catheterization. Cardiac catheterization is used to diagnose and/or treat various heart conditions. Doctors may recommend this procedure for a number of different reasons. The most common reason is to evaluate chest pain. Chest pain can be a symptom of coronary artery disease (CAD), and cardiac catheterization can show whether plaque is narrowing or blocking your heart's arteries. This procedure is also used to evaluate the valves, as well as measure the blood flow and oxygen levels in different parts of your heart. For further information please visit HugeFiesta.tn. Please follow instruction sheet, as given.  Follow-Up: Follow up to be determined based on results of cardiac cath.  If you need a refill on your cardiac medications before your next appointment, please call your pharmacy.  Thank you for choosing [facility]!!      [facility] [place] OFFICE [address], [place] [address] [place] [address] Dept: [phone] Loc: [phone]  Stephen King  08/10/2017  You are scheduled for a cardiac cath on Thursday, Aug 13, 2017 with Dr. Angelena Form..  1. Please arrive at the [facility] (Main Entrance A) at [facility]: [address] [place], [place] [address] at 10 am (two hours before your procedure to ensure your preparation). Free valet parking service is available.   Special note: Every effort is made to have your procedure done on time. Please understand that emergencies sometimes delay scheduled procedures.  2. Diet: Nothing to eat or drink after midnight the night before.  3. Labs: please have blood work today (CBC,BMP)  4. Medication instructions in  preparation for your procedure:     Please hold Xarelto 2 days prior to the cardiac cath.     You may take all other medications as listed the morning of the procedure with sips of water.  5. Plan for one night stay--bring personal belongings. 6. Bring a current list of your medications and current insurance cards. 7. You MUST have a responsible person to drive you home. 8. Someone MUST be with you the first 24 hours after you arrive home or your discharge will be delayed. 9. Please wear clothes that are easy to get on and off and wear slip-on shoes.  Thank you for allowing [place] to care for you!   -- [facility] [place] [facility] Cardi[facility] services

## 2017-08-10 NOTE — Progress Notes (Signed)
Cardiology Office Note:    Date:  08/10/2017   ID:  Stephen King, DOB Jan 25, 1951, MRN [id number]  PCP:  Donella Stade, PA-C  Cardiologist:  No primary care provider on file.   Referring MD: Donella Stade, PA-C     History of Present Illness:    Stephen King is a 67 y.o. male with stroke in 2011, paroxysmal atrial fibrillation on Xarelto, fairly easily converted with IV diltiazem.  Has had intermittent chest pressure.  Because of this coronary CT was performed and showed obtuse marginal stenosis of greater than 70%.  Back in 2003 had a cardiac catheterization that showed no high-grade CAD.  In May 2018 nuclear stress test was low risk no ischemia.  Past Medical History:  Diagnosis Date  . Arthritis   . Dysrhythmia    PAF  . Hernia, umbilical   . Stroke ([place]) 10/11    Past Surgical History:  Procedure Laterality Date  . c6-7 fusion  10/2005  . cataracts    . HEMORROIDECTOMY    . neck fusion    . SHOULDER ARTHROSCOPY WITH ROTATOR CUFF REPAIR Right 10/30/2014   Procedure: SHOULDER ARTHROSCOPY WITH ROTATOR CUFF REPAIR;  Surgeon: Tania Ade, MD;  Location: [facility];  Service: Orthopedics;  Laterality: Right;  . SHOULDER ARTHROSCOPY WITH SUBACROMIAL DECOMPRESSION Right 10/30/2014   Procedure: Right shoulder arthroscopy rotator cuff repair, subacromial decompression;  Surgeon: Tania Ade, MD;  Location: [facility];  Service: Orthopedics;  Laterality: Right;  Right shoulder arthrscopy rotator cuff repair, subacromial decompression    Current Medications: Current Meds  Medication Sig  . metoprolol tartrate (LOPRESSOR) 25 MG tablet Take 0.5 tablets (12.5 mg total) by mouth 2 (two) times daily.  . nitroGLYCERIN (NITROSTAT) 0.4 MG SL tablet Place 0.4 mg under the tongue every 5 (five) minutes x 3 doses as needed for chest pain.  Marland Kitchen XARELTO 20 MG TABS tablet TAKE 1 TABLET BY MOUTH  DAILY WITH SUPPER     Allergies:   Feldene [piroxicam]    Social History   Socioeconomic History  . Marital status: Married    Spouse name: Not on file  . Number of children: Not on file  . Years of education: Not on file  . Highest education level: Not on file  Occupational History  . Not on file  Social Needs  . Financial resource strain: Not on file  . Food insecurity:    Worry: Not on file    Inability: Not on file  . Transportation needs:    Medical: Not on file    Non-medical: Not on file  Tobacco Use  . Smoking status: Former Smoker    Types: Cigarettes    Last attempt to quit: 03/25/1999    Years since quitting: 18.3  . Smokeless tobacco: Never Used  Substance and Sexual Activity  . Alcohol use: No  . Drug use: No  . Sexual activity: Not on file  Lifestyle  . Physical activity:    Days per week: Not on file    Minutes per session: Not on file  . Stress: Not on file  Relationships  . Social connections:    Talks on phone: Not on file    Gets together: Not on file    Attends religious service: Not on file    Active member of club or organization: Not on file    Attends meetings of clubs or organizations: Not on file    Relationship status: Not on  file  Other Topics Concern  . Not on file  Social History Narrative  . Not on file     Family History: The patient's family history includes Alcohol abuse in his father and mother; Arrhythmia in his mother; Colon cancer in his father; Stroke in his maternal grandfather.  ROS:   Please see the history of present illness.    No fevers chills nausea vomiting syncope bleeding, all other systems reviewed and are negative.  EKGs/Labs/Other Studies Reviewed:    The following studies were reviewed today:  CT coronary on 08/07/2017: 1. Coronary calcium score of 19. This was 67 percentile for age and sex matched control.  2. Normal coronary origin with right dominance.  3. Proximal LCX artery before the takeoff of a large OM1 branch has long severe, predominantly  non-calcified plaque with associated stenosis >70%. Cardiac catheterization is recommended.  4.  Mildly dilated pulmonary artery measuring 32 mm.   Electronically Signed   By: Ena Dawley  EKG: No new EKG  Recent Labs: 04/28/2017: BUN 10; Creatinine, Ser 0.96; Hemoglobin 15.3; Platelets 247; Potassium 4.6; Sodium 141  Recent Lipid Panel    Component Value Date/Time   CHOL 151 11/02/2015 0804   TRIG 42 11/02/2015 0804   HDL 53 11/02/2015 0804   CHOLHDL 2.8 11/02/2015 0804   VLDL 8 11/02/2015 0804   LDLCALC 90 11/02/2015 0804    Physical Exam:    VS:  BP 100/68   Pulse (!) 53   Ht 5\' 10"  (1.778 m)   Wt 226 lb 9.6 oz (102.8 kg)   BMI 32.51 kg/m     Wt Readings from Last 3 Encounters:  08/10/17 226 lb 9.6 oz (102.8 kg)  08/04/17 225 lb (102.1 kg)  07/28/17 224 lb (101.6 kg)     GEN:  Well nourished, well developed in no acute distress HEENT: Normal NECK: No JVD; No carotid bruits LYMPHATICS: No lymphadenopathy CARDIAC: RRR, no murmurs, rubs, gallops RESPIRATORY:  Clear to auscultation without rales, wheezing or rhonchi  ABDOMEN: Soft, non-tender, non-distended MUSCULOSKELETAL:  No edema; No deformity  SKIN: Warm and dry NEUROLOGIC:  Alert and oriented x 3 PSYCHIATRIC:  Normal affect   ASSESSMENT:    1. Angina pectoris ([place])   2. Atrial fibrillation with RVR ([place])   3. History of CVA (cerebrovascular accident)   4. Chest pain, unspecified type   5. Pre-procedure lab exam    PLAN:    In order of problems listed above:  Angina/abnormal CT of coronary arteries revealing 70% obtuse marginal stenosis -Recommendation is for cardiac catheterization given his chest discomfort.  Risks and benefits of procedure including stroke heart attack death renal impairment bleeding have been discussed.  Most recent creatinine 0.96.  We will have to hold Xarelto 2 days prior.  Several questions answered.  I think we should initiate statin therapy once we understand his  coronary anatomy for prevention.  I think he is hesitant to take more medication.  If a stent is placed, he will need to be placed on triple therapy transiently.  On Xarelto.  Last LDL 90 on our record.  Paroxysmal atrial fibrillation, flutter - IV diltiazem has helped with conversion in the past.  Metoprolol 12.5 mg twice a day was started during office visit 08/04/2017 by Cecilie Kicks.  Previous episodes of atrial fibrillation were October 2018 and April 2019 as well as May 2019.  May need antiarrhythmic.  Would not choose 1C agent because of coronary artery disease.  May benefit  from Tikosyn in the future.  Obesity -Encourage weight loss.  Medication Adjustments/Labs and Tests Ordered: Current medicines are reviewed at length with the patient today.  Concerns regarding medicines are outlined above.  Orders Placed This Encounter  Procedures  . CBC  . Basic metabolic panel   No orders of the defined types were placed in this encounter.   Patient Instructions  Medication Instructions:  The current medical regimen is effective;  continue present plan and medications.  Labwork: Please have blood work today. (CBC, BMP)  Testing/Procedures: Your physician has requested that you have a cardiac catheterization. Cardiac catheterization is used to diagnose and/or treat various heart conditions. Doctors may recommend this procedure for a number of different reasons. The most common reason is to evaluate chest pain. Chest pain can be a symptom of coronary artery disease (CAD), and cardiac catheterization can show whether plaque is narrowing or blocking your heart's arteries. This procedure is also used to evaluate the valves, as well as measure the blood flow and oxygen levels in different parts of your heart. For further information please visit HugeFiesta.tn. Please follow instruction sheet, as given.  Follow-Up: Follow up to be determined based on results of cardiac cath.  If you need a  refill on your cardiac medications before your next appointment, please call your pharmacy.  Thank you for choosing [facility]!!      [place] OFFICE [address], [place] [address] [place] [address] Dept: [phone] Loc: [phone]  GERALD KUEHL  08/10/2017  You are scheduled for a cardiac cath on Thursday, Aug 13, 2017 with Dr. Angelena Form..  1. Please arrive at the [facility] (Main Entrance A) at [facility]: [address] [place], [place] [address] at 10 am (two hours before your procedure to ensure your preparation). Free valet parking service is available.   Special note: Every effort is made to have your procedure done on time. Please understand that emergencies sometimes delay scheduled procedures.  2. Diet: Nothing to eat or drink after midnight the night before.  3. Labs: please have blood work today (CBC,BMP)  4. Medication instructions in preparation for your procedure:     Please hold Xarelto 2 days prior to the cardiac cath.     You may take all other medications as listed the morning of the procedure with sips of water.  5. Plan for one night stay--bring personal belongings. 6. Bring a current list of your medications and current insurance cards. 7. You MUST have a responsible person to drive you home. 8. Someone MUST be with you the first 24 hours after you arrive home or your discharge will be delayed. 9. Please wear clothes that are easy to get on and off and wear slip-on shoes.  Thank you for allowing [place] to care for you!   -- [facility] In[facility] Cardiovascular services     Signed, Candee Furbish, MD  08/10/2017 11:24 AM    [place]

## 2017-08-10 NOTE — H&P (View-Only) (Signed)
Cardiology Office Note:    Date:  08/10/2017   ID:  Stephen King, DOB Jul 20, 1950, MRN [id number]  PCP:  Donella Stade, PA-C  Cardiologist:  No primary care provider on file.   Referring MD: Donella Stade, PA-C     History of Present Illness:    Stephen King is a 67 y.o. male with stroke in 2011, paroxysmal atrial fibrillation on Xarelto, fairly easily converted with IV diltiazem.  Has had intermittent chest pressure.  Because of this coronary CT was performed and showed obtuse marginal stenosis of greater than 70%.  Back in 2003 had a cardiac catheterization that showed no high-grade CAD.  In May 2018 nuclear stress test was low risk no ischemia.  Past Medical History:  Diagnosis Date  . Arthritis   . Dysrhythmia    PAF  . Hernia, umbilical   . Stroke ([place]) 10/11    Past Surgical History:  Procedure Laterality Date  . c6-7 fusion  10/2005  . cataracts    . HEMORROIDECTOMY    . neck fusion    . SHOULDER ARTHROSCOPY WITH ROTATOR CUFF REPAIR Right 10/30/2014   Procedure: SHOULDER ARTHROSCOPY WITH ROTATOR CUFF REPAIR;  Surgeon: Tania Ade, MD;  Location: [facility];  Service: Orthopedics;  Laterality: Right;  . SHOULDER ARTHROSCOPY WITH SUBACROMIAL DECOMPRESSION Right 10/30/2014   Procedure: Right shoulder arthroscopy rotator cuff repair, subacromial decompression;  Surgeon: Tania Ade, MD;  Location: [facility];  Service: Orthopedics;  Laterality: Right;  Right shoulder arthrscopy rotator cuff repair, subacromial decompression    Current Medications: Current Meds  Medication Sig  . metoprolol tartrate (LOPRESSOR) 25 MG tablet Take 0.5 tablets (12.5 mg total) by mouth 2 (two) times daily.  . nitroGLYCERIN (NITROSTAT) 0.4 MG SL tablet Place 0.4 mg under the tongue every 5 (five) minutes x 3 doses as needed for chest pain.  Stephen King XARELTO 20 MG TABS tablet TAKE 1 TABLET BY MOUTH  DAILY WITH SUPPER     Allergies:   Feldene [piroxicam]    Social History   Socioeconomic History  . Marital status: Married    Spouse name: Not on file  . Number of children: Not on file  . Years of education: Not on file  . Highest education level: Not on file  Occupational History  . Not on file  Social Needs  . Financial resource strain: Not on file  . Food insecurity:    Worry: Not on file    Inability: Not on file  . Transportation needs:    Medical: Not on file    Non-medical: Not on file  Tobacco Use  . Smoking status: Former Smoker    Types: Cigarettes    Last attempt to quit: 03/25/1999    Years since quitting: 18.3  . Smokeless tobacco: Never Used  Substance and Sexual Activity  . Alcohol use: No  . Drug use: No  . Sexual activity: Not on file  Lifestyle  . Physical activity:    Days per week: Not on file    Minutes per session: Not on file  . Stress: Not on file  Relationships  . Social connections:    Talks on phone: Not on file    Gets together: Not on file    Attends religious service: Not on file    Active member of club or organization: Not on file    Attends meetings of clubs or organizations: Not on file    Relationship status: Not on  file  Other Topics Concern  . Not on file  Social History Narrative  . Not on file     Family History: The patient's family history includes Alcohol abuse in his father and mother; Arrhythmia in his mother; Colon cancer in his father; Stroke in his maternal grandfather.  ROS:   Please see the history of present illness.    No fevers chills nausea vomiting syncope bleeding, all other systems reviewed and are negative.  EKGs/Labs/Other Studies Reviewed:    The following studies were reviewed today:  CT coronary on 08/07/2017: 1. Coronary calcium score of 19. This was 70 percentile for age and sex matched control.  2. Normal coronary origin with right dominance.  3. Proximal LCX artery before the takeoff of a large OM1 branch has long severe, predominantly  non-calcified plaque with associated stenosis >70%. Cardiac catheterization is recommended.  4.  Mildly dilated pulmonary artery measuring 32 mm.   Electronically Signed   By: Ena Dawley  EKG: No new EKG  Recent Labs: 04/28/2017: BUN 10; Creatinine, Ser 0.96; Hemoglobin 15.3; Platelets 247; Potassium 4.6; Sodium 141  Recent Lipid Panel    Component Value Date/Time   CHOL 151 11/02/2015 0804   TRIG 42 11/02/2015 0804   HDL 53 11/02/2015 0804   CHOLHDL 2.8 11/02/2015 0804   VLDL 8 11/02/2015 0804   LDLCALC 90 11/02/2015 0804    Physical Exam:    VS:  BP 100/68   Pulse (!) 53   Ht 5\' 10"  (1.778 m)   Wt 226 lb 9.6 oz (102.8 kg)   BMI 32.51 kg/m     Wt Readings from Last 3 Encounters:  08/10/17 226 lb 9.6 oz (102.8 kg)  08/04/17 225 lb (102.1 kg)  07/28/17 224 lb (101.6 kg)     GEN:  Well nourished, well developed in no acute distress HEENT: Normal NECK: No JVD; No carotid bruits LYMPHATICS: No lymphadenopathy CARDIAC: RRR, no murmurs, rubs, gallops RESPIRATORY:  Clear to auscultation without rales, wheezing or rhonchi  ABDOMEN: Soft, non-tender, non-distended MUSCULOSKELETAL:  No edema; No deformity  SKIN: Warm and dry NEUROLOGIC:  Alert and oriented x 3 PSYCHIATRIC:  Normal affect   ASSESSMENT:    1. Angina pectoris ([place])   2. Atrial fibrillation with RVR ([place])   3. History of CVA (cerebrovascular accident)   4. Chest pain, unspecified type   5. Pre-procedure lab exam    PLAN:    In order of problems listed above:  Angina/abnormal CT of coronary arteries revealing 70% obtuse marginal stenosis -Recommendation is for cardiac catheterization given his chest discomfort.  Risks and benefits of procedure including stroke heart attack death renal impairment bleeding have been discussed.  Most recent creatinine 0.96.  We will have to hold Xarelto 2 days prior.  Several questions answered.  I think we should initiate statin therapy once we understand his  coronary anatomy for prevention.  I think he is hesitant to take more medication.  If a stent is placed, he will need to be placed on triple therapy transiently.  On Xarelto.  Last LDL 90 on our record.  Paroxysmal atrial fibrillation, flutter - IV diltiazem has helped with conversion in the past.  Metoprolol 12.5 mg twice a day was started during office visit 08/04/2017 by Cecilie Kicks.  Previous episodes of atrial fibrillation were October 2018 and April 2019 as well as May 2019.  May need antiarrhythmic.  Would not choose 1C agent because of coronary artery disease.  May benefit  from Tikosyn in the future.  Obesity -Encourage weight loss.  Medication Adjustments/Labs and Tests Ordered: Current medicines are reviewed at length with the patient today.  Concerns regarding medicines are outlined above.  Orders Placed This Encounter  Procedures  . CBC  . Basic metabolic panel   No orders of the defined types were placed in this encounter.   Patient Instructions  Medication Instructions:  The current medical regimen is effective;  continue present plan and medications.  Labwork: Please have blood work today. (CBC, BMP)  Testing/Procedures: Your physician has requested that you have a cardiac catheterization. Cardiac catheterization is used to diagnose and/or treat various heart conditions. Doctors may recommend this procedure for a number of different reasons. The most common reason is to evaluate chest pain. Chest pain can be a symptom of coronary artery disease (CAD), and cardiac catheterization can show whether plaque is narrowing or blocking your heart's arteries. This procedure is also used to evaluate the valves, as well as measure the blood flow and oxygen levels in different parts of your heart. For further information please visit [email or web address]. Please follow instruction sheet, as given.  Follow-Up: Follow up to be determined based on results of cardiac cath.  If you need a  refill on your cardiac medications before your next appointment, please call your pharmacy.  Thank you for choosing [facility]!!      [facility] OFFICE [address] [place] [address] [place] [address] Dept: [phone] Loc: [phone]  CALVYN KURTZMAN  08/10/2017  You are scheduled for a cardiac cath on Thursday, Aug 13, 2017 with Dr. Angelena Form..  1. Please arrive at the [facility] (Main Entrance A) at [facility]: [address] [place], [place] [address] at 10 am (two hours before your procedure to ensure your preparation). Free valet parking service is available.   Special note: Every effort is made to have your procedure done on time. Please understand that emergencies sometimes delay scheduled procedures.  2. Diet: Nothing to eat or drink after midnight the night before.  3. Labs: please have blood work today (CBC,BMP)  4. Medication instructions in preparation for your procedure:     Please hold Xarelto 2 days prior to the cardiac cath.     You may take all other medications as listed the morning of the procedure with sips of water.  5. Plan for one night stay--bring personal belongings. 6. Bring a current list of your medications and current insurance cards. 7. You MUST have a responsible person to drive you home. 8. Someone MUST be with you the first 24 hours after you arrive home or your discharge will be delayed. 9. Please wear clothes that are easy to get on and off and wear slip-on shoes.  Thank you for allowing [place] to care for you!   -- [facility] Cardiovascular services     Signed, Candee Furbish, MD  08/10/2017 11:24 AM    [place]

## 2017-08-11 ENCOUNTER — Telehealth: Payer: Self-pay | Admitting: *Deleted

## 2017-08-11 NOTE — Telephone Encounter (Signed)
Pt contacted pre-catheterization scheduled at [facility] for: Thursday May 23,2019 12 noon Verified arrival time and place: [place] Entrance A at: 10 AM  No solid food after midnight prior to cath, clear liquids until 5 AM day of procedure. Verified allergies in Epic Verified no diabetes medications.  Hold: Xarelto-08/11/17 until post procedure  AM meds can be  taken pre-cath with sip of water including: ASA 81 mg  Confirmed patient has responsible person to drive home post procedure and observe patient for 24 hours: yes

## 2017-08-13 ENCOUNTER — Ambulatory Visit (HOSPITAL_COMMUNITY)
Admission: RE | Disposition: A | Discharge status: Home / Self Care | Payer: Self-pay | Source: Ambulatory Visit | Attending: Cardiovascular Disease

## 2017-08-13 ENCOUNTER — Other Ambulatory Visit: Payer: Self-pay

## 2017-08-13 ENCOUNTER — Ambulatory Visit (HOSPITAL_COMMUNITY)
Admission: RE | Admit: 2017-08-13 | Discharge: 2017-08-14 | Disposition: A | Discharge status: Home / Self Care | Payer: Medicare Other | Source: Ambulatory Visit | Attending: Cardiovascular Disease | Admitting: Cardiovascular Disease

## 2017-08-13 ENCOUNTER — Encounter (HOSPITAL_COMMUNITY): Payer: Self-pay | Admitting: General Practice

## 2017-08-13 DIAGNOSIS — Z8673 Personal history of transient ischemic attack (TIA), and cerebral infarction without residual deficits: Secondary | ICD-10-CM | POA: Diagnosis not present

## 2017-08-13 DIAGNOSIS — M1990 Unspecified osteoarthritis, unspecified site: Secondary | ICD-10-CM | POA: Diagnosis not present

## 2017-08-13 DIAGNOSIS — I480 Paroxysmal atrial fibrillation: Secondary | ICD-10-CM | POA: Diagnosis not present

## 2017-08-13 DIAGNOSIS — E669 Obesity, unspecified: Secondary | ICD-10-CM | POA: Insufficient documentation

## 2017-08-13 DIAGNOSIS — I200 Unstable angina: Secondary | ICD-10-CM | POA: Diagnosis present

## 2017-08-13 DIAGNOSIS — I209 Angina pectoris, unspecified: Secondary | ICD-10-CM

## 2017-08-13 DIAGNOSIS — Z6832 Body mass index (BMI) 32.0-32.9, adult: Secondary | ICD-10-CM | POA: Insufficient documentation

## 2017-08-13 DIAGNOSIS — I25110 Atherosclerotic heart disease of native coronary artery with unstable angina pectoris: Secondary | ICD-10-CM | POA: Diagnosis not present

## 2017-08-13 DIAGNOSIS — Z955 Presence of coronary angioplasty implant and graft: Secondary | ICD-10-CM

## 2017-08-13 DIAGNOSIS — Z7901 Long term (current) use of anticoagulants: Secondary | ICD-10-CM | POA: Insufficient documentation

## 2017-08-13 DIAGNOSIS — Z87891 Personal history of nicotine dependence: Secondary | ICD-10-CM | POA: Diagnosis not present

## 2017-08-13 DIAGNOSIS — I2510 Atherosclerotic heart disease of native coronary artery without angina pectoris: Secondary | ICD-10-CM

## 2017-08-13 DIAGNOSIS — Z9861 Coronary angioplasty status: Secondary | ICD-10-CM

## 2017-08-13 HISTORY — PX: CORONARY ANGIOPLASTY WITH STENT PLACEMENT: SHX49

## 2017-08-13 HISTORY — DX: Personal history of urinary calculi: Z87.442

## 2017-08-13 HISTORY — DX: Personal history of other diseases of the digestive system: Z87.19

## 2017-08-13 HISTORY — PX: LEFT HEART CATH AND CORONARY ANGIOGRAPHY: CATH118249

## 2017-08-13 HISTORY — PX: CORONARY STENT INTERVENTION: CATH118234

## 2017-08-13 LAB — POCT ACTIVATED CLOTTING TIME: Activated Clotting Time: 318 seconds

## 2017-08-13 SURGERY — LEFT HEART CATH AND CORONARY ANGIOGRAPHY
Anesthesia: LOCAL

## 2017-08-13 MED ORDER — SODIUM CHLORIDE 0.9 % WEIGHT BASED INFUSION: 1.0000 mL/kg/h | INTRAVENOUS | Status: DC

## 2017-08-13 MED ORDER — SODIUM CHLORIDE 0.9 % IV BOLUS
250.0000 mL | Freq: Once | INTRAVENOUS | Status: AC
  Administered 2017-08-13: 15:00:00 250 mL via INTRAVENOUS

## 2017-08-13 MED ORDER — ACETAMINOPHEN 325 MG PO TABS
650.0000 mg | ORAL_TABLET | ORAL | Status: DC | PRN
  Administered 2017-08-13: 19:00:00 650 mg via ORAL
  Filled 2017-08-13: qty 2

## 2017-08-13 MED ORDER — HYDROMORPHONE HCL 2 MG/ML IJ SOLN: 0.5000 mg | INTRAMUSCULAR | Status: DC | PRN

## 2017-08-13 MED ORDER — SODIUM CHLORIDE 0.9% FLUSH: 3.0000 mL | Freq: Two times a day (BID) | INTRAVENOUS | Status: DC

## 2017-08-13 MED ORDER — HEPARIN (PORCINE) IN NACL 2-0.9 UNITS/ML
INTRAMUSCULAR | Status: DC | PRN
  Administered 2017-08-13: 10 mL via INTRA_ARTERIAL

## 2017-08-13 MED ORDER — NITROGLYCERIN IN D5W 200-5 MCG/ML-% IV SOLN
INTRAVENOUS | Status: AC | PRN
  Administered 2017-08-13: 10 ug/min via INTRAVENOUS

## 2017-08-13 MED ORDER — HEPARIN SODIUM (PORCINE) 1000 UNIT/ML IJ SOLN
INTRAMUSCULAR | Status: DC | PRN
  Administered 2017-08-13: 10000 [IU] via INTRAVENOUS

## 2017-08-13 MED ORDER — SODIUM CHLORIDE 0.9% FLUSH: 3.0000 mL | INTRAVENOUS | Status: DC | PRN

## 2017-08-13 MED ORDER — CLOPIDOGREL BISULFATE 75 MG PO TABS
75.0000 mg | ORAL_TABLET | Freq: Every day | ORAL | Status: DC
  Administered 2017-08-14: 75 mg via ORAL
  Filled 2017-08-13: qty 1

## 2017-08-13 MED ORDER — HYDROMORPHONE HCL 1 MG/ML IJ SOLN
INTRAMUSCULAR | Status: DC | PRN
  Administered 2017-08-13: 0.5 mg via INTRAVENOUS

## 2017-08-13 MED ORDER — HEPARIN SODIUM (PORCINE) 1000 UNIT/ML IJ SOLN
INTRAMUSCULAR | Status: AC
  Filled 2017-08-13: qty 1

## 2017-08-13 MED ORDER — LIDOCAINE HCL (PF) 1 % IJ SOLN
INTRAMUSCULAR | Status: DC | PRN
  Administered 2017-08-13: 2 mL

## 2017-08-13 MED ORDER — NITROGLYCERIN IN D5W 200-5 MCG/ML-% IV SOLN: 0.0000 ug/min | INTRAVENOUS | Status: DC

## 2017-08-13 MED ORDER — METOPROLOL TARTRATE 12.5 MG HALF TABLET
25.0000 mg | ORAL_TABLET | Freq: Two times a day (BID) | ORAL | Status: DC
  Administered 2017-08-14: 25 mg via ORAL
  Filled 2017-08-13: qty 2

## 2017-08-13 MED ORDER — FAMOTIDINE IN NACL 20-0.9 MG/50ML-% IV SOLN
INTRAVENOUS | Status: AC
  Filled 2017-08-13: qty 50

## 2017-08-13 MED ORDER — NITROGLYCERIN 0.4 MG SL SUBL: 0.4000 mg | SUBLINGUAL_TABLET | SUBLINGUAL | Status: DC | PRN

## 2017-08-13 MED ORDER — VERAPAMIL HCL 2.5 MG/ML IV SOLN
INTRAVENOUS | Status: AC
  Filled 2017-08-13: qty 2

## 2017-08-13 MED ORDER — HEPARIN (PORCINE) IN NACL 1000-0.9 UT/500ML-% IV SOLN
INTRAVENOUS | Status: AC
  Filled 2017-08-13: qty 1000

## 2017-08-13 MED ORDER — MIDAZOLAM HCL 2 MG/2ML IJ SOLN
INTRAMUSCULAR | Status: AC
  Filled 2017-08-13: qty 2

## 2017-08-13 MED ORDER — MIDAZOLAM HCL 2 MG/2ML IJ SOLN
INTRAMUSCULAR | Status: DC | PRN
  Administered 2017-08-13: 2 mg via INTRAVENOUS
  Administered 2017-08-13: 1 mg via INTRAVENOUS

## 2017-08-13 MED ORDER — SODIUM CHLORIDE 0.9 % IV SOLN
INTRAVENOUS | Status: AC
  Administered 2017-08-13: 15:00:00 via INTRAVENOUS

## 2017-08-13 MED ORDER — HYDRALAZINE HCL 20 MG/ML IJ SOLN: 5.0000 mg | INTRAMUSCULAR | Status: AC | PRN

## 2017-08-13 MED ORDER — ASPIRIN 81 MG PO CHEW: 81.0000 mg | CHEWABLE_TABLET | ORAL | Status: DC

## 2017-08-13 MED ORDER — ASPIRIN 81 MG PO CHEW
81.0000 mg | CHEWABLE_TABLET | Freq: Every day | ORAL | Status: DC
  Administered 2017-08-14: 09:00:00 81 mg via ORAL
  Filled 2017-08-13: qty 1

## 2017-08-13 MED ORDER — CLOPIDOGREL BISULFATE 300 MG PO TABS
ORAL_TABLET | ORAL | Status: DC | PRN
  Administered 2017-08-13: 600 mg via ORAL

## 2017-08-13 MED ORDER — FENTANYL CITRATE (PF) 100 MCG/2ML IJ SOLN
INTRAMUSCULAR | Status: DC | PRN
  Administered 2017-08-13: 25 ug via INTRAVENOUS
  Administered 2017-08-13: 50 ug via INTRAVENOUS
  Administered 2017-08-13: 25 ug via INTRAVENOUS

## 2017-08-13 MED ORDER — LABETALOL HCL 5 MG/ML IV SOLN: 10.0000 mg | INTRAVENOUS | Status: AC | PRN

## 2017-08-13 MED ORDER — IOHEXOL 350 MG/ML SOLN
INTRAVENOUS | Status: DC | PRN
  Administered 2017-08-13: 140 mL via INTRAVENOUS

## 2017-08-13 MED ORDER — OXYCODONE HCL 5 MG PO TABS
5.0000 mg | ORAL_TABLET | ORAL | Status: DC | PRN
  Administered 2017-08-13 – 2017-08-14 (×3): 5 mg via ORAL
  Filled 2017-08-13: qty 2
  Filled 2017-08-13 (×2): qty 1

## 2017-08-13 MED ORDER — MIDAZOLAM HCL 2 MG/2ML IJ SOLN
INTRAMUSCULAR | Status: AC
Start: 2017-08-13 — End: ?
  Filled 2017-08-13: qty 2

## 2017-08-13 MED ORDER — LIDOCAINE HCL (PF) 1 % IJ SOLN
INTRAMUSCULAR | Status: AC
  Filled 2017-08-13: qty 30

## 2017-08-13 MED ORDER — SODIUM CHLORIDE 0.9 % IV SOLN: 250.0000 mL | INTRAVENOUS | Status: DC | PRN

## 2017-08-13 MED ORDER — FAMOTIDINE IN NACL 20-0.9 MG/50ML-% IV SOLN
INTRAVENOUS | Status: AC | PRN
  Administered 2017-08-13: 20 mg via INTRAVENOUS

## 2017-08-13 MED ORDER — FENTANYL CITRATE (PF) 100 MCG/2ML IJ SOLN
INTRAMUSCULAR | Status: AC
  Filled 2017-08-13: qty 2

## 2017-08-13 MED ORDER — ONDANSETRON HCL 4 MG/2ML IJ SOLN
4.0000 mg | Freq: Four times a day (QID) | INTRAMUSCULAR | Status: DC | PRN
  Administered 2017-08-13: 22:00:00 4 mg via INTRAVENOUS
  Filled 2017-08-13: qty 2

## 2017-08-13 MED ORDER — NITROGLYCERIN IN D5W 200-5 MCG/ML-% IV SOLN
INTRAVENOUS | Status: AC
  Filled 2017-08-13: qty 250

## 2017-08-13 MED ORDER — SODIUM CHLORIDE 0.9 % WEIGHT BASED INFUSION
3.0000 mL/kg/h | INTRAVENOUS | Status: DC
  Administered 2017-08-13: 3 mL via INTRAVENOUS
  Administered 2017-08-13: 3 mL/kg/h via INTRAVENOUS

## 2017-08-13 MED ORDER — HEPARIN (PORCINE) IN NACL 2-0.9 UNITS/ML
INTRAMUSCULAR | Status: AC | PRN
  Administered 2017-08-13 (×2): 500 mL

## 2017-08-13 MED ORDER — ATORVASTATIN CALCIUM 80 MG PO TABS
80.0000 mg | ORAL_TABLET | Freq: Every day | ORAL | Status: DC
  Administered 2017-08-13: 80 mg via ORAL
  Filled 2017-08-13: qty 1

## 2017-08-13 MED ORDER — HYDROMORPHONE HCL 1 MG/ML IJ SOLN
INTRAMUSCULAR | Status: AC
  Filled 2017-08-13: qty 0.5

## 2017-08-13 SURGICAL SUPPLY — 20 items
BALLN SAPPHIRE 2.5X12 (BALLOONS) ×2
BALLN SAPPHIRE ~~LOC~~ 4.0X10 (BALLOONS) ×1 IMPLANT
BALLOON SAPPHIRE 2.5X12 (BALLOONS) IMPLANT
CATH IMPULSE 5F ANG/FL3.5 (CATHETERS) ×1 IMPLANT
CATH VISTA GUIDE 6FR XB3 (CATHETERS) ×1 IMPLANT
DEVICE RAD COMP TR BAND LRG (VASCULAR PRODUCTS) ×1 IMPLANT
GUIDEWIRE INQWIRE 1.5J.035X260 (WIRE) IMPLANT
INQWIRE 1.5J .035X260CM (WIRE) ×4
KIT ENCORE 26 ADVANTAGE (KITS) ×1 IMPLANT
KIT HEART LEFT (KITS) ×2 IMPLANT
KIT HEMO VALVE WATCHDOG (MISCELLANEOUS) ×1 IMPLANT
NDL PERC 21GX4CM (NEEDLE) IMPLANT
NEEDLE PERC 21GX4CM (NEEDLE) ×2 IMPLANT
PACK CARDIAC CATHETERIZATION (CUSTOM PROCEDURE TRAY) ×2 IMPLANT
SHEATH RAIN RADIAL 21G 6FR (SHEATH) ×1 IMPLANT
STENT RESOLUTE ONYX 4.0X18 (Permanent Stent) ×1 IMPLANT
SYR MEDRAD MARK V 150ML (SYRINGE) ×2 IMPLANT
TRANSDUCER W/STOPCOCK (MISCELLANEOUS) ×2 IMPLANT
TUBING CIL FLEX 10 FLL-RA (TUBING) ×2 IMPLANT
WIRE COUGAR XT STRL 190CM (WIRE) ×1 IMPLANT

## 2017-08-13 NOTE — Progress Notes (Signed)
TR BAND REMOVAL  LOCATION:    right radial  DEFLATED PER PROTOCOL:    Yes.    TIME BAND OFF / DRESSING APPLIED:    1830   SITE UPON ARRIVAL:    Level 0  SITE AFTER BAND REMOVAL:    Level 0  CIRCULATION SENSATION AND MOVEMENT:    Within Normal Limits   Yes.    COMMENTS:   Rechecked at 1900 with no change in assessment. Dressing dry and intact

## 2017-08-13 NOTE — Interval H&P Note (Signed)
History and Physical Interval Note:  08/13/2017 11:12 AM  Stephen King  has presented today for surgery, with the diagnosis of unstable angina. The various methods of treatment have been discussed with the patient and family. After consideration of risks, benefits and other options for treatment, the patient has consented to  Procedure(s): LEFT HEART CATH AND CORONARY ANGIOGRAPHY (N/A) as a surgical intervention .  The patient's history has been reviewed, patient examined, no change in status, stable for surgery.  I have reviewed the patient's chart and labs.  Questions were answered to the patient's satisfaction.    Cath Lab Visit (complete for each Cath Lab visit)  Clinical Evaluation Leading to the Procedure:   ACS: No.  Non-ACS:    Anginal Classification: CCS III  Anti-ischemic medical therapy: Minimal Therapy (1 class of medications)  Non-Invasive Test Results: No non-invasive testing performed  Prior CABG: No previous CABG         Lauree Chandler

## 2017-08-13 NOTE — Progress Notes (Signed)
Patient admitted to unit with complaints of chest pain 3/10. "much better that before.". Blood pressure on admission 104/76. Nitro infusing at 16.67 mcg, decreases to 10 mcg. Blood pressure at 1430 77/42, 1445 71/47, decreased nitro infusion to 5 mcg. O2 2 liters [place] applied.  Blood pressure slowly increasing 1450 78/46, 1455 82/55, 1500 84/51. Nitro infusion dc'd to 1500. Patient complained of 7/10 chest pain, Nitro infusion started at 20 mcg, Bolused with 250 mls NS (left ventricular ef >65 % per cath report). Dr. Angelena Form notified and orders received. Blood pressure at 1530 102/70 and chest pain intermittently 7/10-9/10. Increased nitro infusion to 25 mcg. Patient monitored closely and chest pain slowly decreasing. Patient sleeping when checked. 1800 stated that it felt like a bruise on his chest but no more pain like earlier today. 1925 given tylenol for complaints of headache. Resting comfortably at this time.

## 2017-08-13 NOTE — Plan of Care (Signed)
  Problem: Education: Goal: Knowledge of General Education information will improve Outcome: Progressing   

## 2017-08-14 ENCOUNTER — Encounter (HOSPITAL_COMMUNITY): Payer: Self-pay | Admitting: Cardiovascular Disease

## 2017-08-14 DIAGNOSIS — Z87891 Personal history of nicotine dependence: Secondary | ICD-10-CM | POA: Diagnosis not present

## 2017-08-14 DIAGNOSIS — Z8673 Personal history of transient ischemic attack (TIA), and cerebral infarction without residual deficits: Secondary | ICD-10-CM | POA: Diagnosis not present

## 2017-08-14 DIAGNOSIS — Z7901 Long term (current) use of anticoagulants: Secondary | ICD-10-CM | POA: Diagnosis not present

## 2017-08-14 DIAGNOSIS — I25110 Atherosclerotic heart disease of native coronary artery with unstable angina pectoris: Secondary | ICD-10-CM | POA: Diagnosis not present

## 2017-08-14 DIAGNOSIS — I2510 Atherosclerotic heart disease of native coronary artery without angina pectoris: Secondary | ICD-10-CM

## 2017-08-14 DIAGNOSIS — Z6832 Body mass index (BMI) 32.0-32.9, adult: Secondary | ICD-10-CM | POA: Diagnosis not present

## 2017-08-14 DIAGNOSIS — Z9861 Coronary angioplasty status: Secondary | ICD-10-CM | POA: Diagnosis not present

## 2017-08-14 DIAGNOSIS — E669 Obesity, unspecified: Secondary | ICD-10-CM | POA: Diagnosis not present

## 2017-08-14 DIAGNOSIS — M1990 Unspecified osteoarthritis, unspecified site: Secondary | ICD-10-CM | POA: Diagnosis not present

## 2017-08-14 DIAGNOSIS — I480 Paroxysmal atrial fibrillation: Secondary | ICD-10-CM

## 2017-08-14 LAB — CBC
HCT: 40.2 % (ref 39.0–52.0)
Hemoglobin: 13.8 g/dL (ref 13.0–17.0)
MCH: 32.1 pg (ref 26.0–34.0)
MCHC: 34.3 g/dL (ref 30.0–36.0)
MCV: 93.5 fL (ref 78.0–100.0)
Platelets: 250 10*3/uL (ref 150–400)
RBC: 4.3 MIL/uL (ref 4.22–5.81)
RDW: 12.7 % (ref 11.5–15.5)
WBC: 11.9 10*3/uL — ABNORMAL HIGH (ref 4.0–10.5)

## 2017-08-14 LAB — BASIC METABOLIC PANEL
Anion gap: 6 (ref 5–15)
BUN: 10 mg/dL (ref 6–20)
CHLORIDE: 107 mmol/L (ref 101–111)
CO2: 26 mmol/L (ref 22–32)
Calcium: 8.4 mg/dL — ABNORMAL LOW (ref 8.9–10.3)
Creatinine, Ser: 1.02 mg/dL (ref 0.61–1.24)
GFR calc Af Amer: >60 mL/min (ref >60)
GFR calc non Af Amer: >60 mL/min (ref >60)
GLUCOSE: 126 mg/dL — AB (ref 65–99)
POTASSIUM: 4.2 mmol/L (ref 3.5–5.1)
Sodium: 139 mmol/L (ref 135–145)

## 2017-08-14 MED ORDER — ATORVASTATIN CALCIUM 80 MG PO TABS: 80.0000 mg | ORAL_TABLET | Freq: Every day | ORAL | 3 refills | Status: DC

## 2017-08-14 MED ORDER — ACETAMINOPHEN 325 MG PO TABS: 650.0000 mg | ORAL_TABLET | Freq: Four times a day (QID) | ORAL | Status: DC | PRN

## 2017-08-14 MED ORDER — RIVAROXABAN 20 MG PO TABS
20.0000 mg | ORAL_TABLET | Freq: Every day | ORAL | Status: DC
  Administered 2017-08-14: 15:00:00 20 mg via ORAL
  Filled 2017-08-14: qty 1

## 2017-08-14 MED ORDER — ANGIOPLASTY BOOK
Freq: Once | Status: DC
  Filled 2017-08-14: qty 1

## 2017-08-14 MED ORDER — ASPIRIN 81 MG PO CHEW: 81.0000 mg | CHEWABLE_TABLET | Freq: Every day | ORAL | Status: DC

## 2017-08-14 MED ORDER — OXYCODONE HCL 5 MG PO TABS: 5.0000 mg | ORAL_TABLET | Freq: Four times a day (QID) | ORAL | 0 refills | Status: DC | PRN

## 2017-08-14 MED ORDER — ANGIOPLASTY BOOK: 1.0000 | Freq: Once | Status: AC

## 2017-08-14 MED ORDER — CLOPIDOGREL BISULFATE 75 MG PO TABS: 75.0000 mg | ORAL_TABLET | Freq: Every day | ORAL | 3 refills | Status: DC

## 2017-08-14 MED FILL — Heparin Sod (Porcine)-NaCl IV Soln 1000 Unit/500ML-0.9%: INTRAVENOUS | Qty: 1000 | Status: AC

## 2017-08-14 NOTE — Progress Notes (Addendum)
Progress Note  Patient Name: Stephen King Date of Encounter: 08/14/2017  Primary Cardiologist: Candee Furbish, MD   Subjective   Chest pain overnight and this AM (jailed OM). No change with NTG  Inpatient Medications    Scheduled Meds: . angioplasty book   Does not apply Once  . aspirin  81 mg Oral Daily  . atorvastatin  80 mg Oral q1800  . clopidogrel  75 mg Oral Q breakfast  . metoprolol tartrate  25 mg Oral BID  . sodium chloride flush  3 mL Intravenous Q12H   Continuous Infusions: . sodium chloride    . nitroGLYCERIN Stopped (08/14/17 0600)   PRN Meds: sodium chloride, acetaminophen, HYDROmorphone (DILAUDID) injection, nitroGLYCERIN, ondansetron (ZOFRAN) IV, oxyCODONE, sodium chloride flush   Vital Signs    Vitals:   08/13/17 1940 08/13/17 2010 08/14/17 0000 08/14/17 0400  BP: (!) 101/58 99/62 100/76 113/72  Pulse: (!) 56 (!) 59 (!) 57 (!) 51  Resp: 10 13 15 15   Temp:    98.4 F (36.9 C)  TempSrc:    Oral  SpO2: 95% 94% 93% 94%  Weight:    229 lb 15 oz (104.3 kg)  Height:        Intake/Output Summary (Last 24 hours) at 08/14/2017 1610 Last data filed at 08/14/2017 0300 Gross per 24 hour  Intake 657.53 ml  Output -  Net 657.53 ml   Filed Weights   08/13/17 1001 08/14/17 0400  Weight: 224 lb (101.6 kg) 229 lb 15 oz (104.3 kg)    Telemetry    NSR-SB-55 - Personally Reviewed  ECG    NSR, SB, PVC - Personally Reviewed  Physical Exam   GEN: No acute distress.   Neck: No JVD Cardiac: RRR, no murmurs, rubs, or gallops.  Respiratory: Clear to auscultation bilaterally. GI: Soft, nontender, non-distended  MS: No edema; No deformity. Neuro:  Nonfocal  Psych: Normal affect   Labs    Chemistry Recent Labs  Lab 08/10/17 1123 08/14/17 0242  NA 141 139  K 4.6 4.2  CL 103 107  CO2 25 26  GLUCOSE 88 126*  BUN 9 10  CREATININE 1.03 1.02  CALCIUM 9.6 8.4*  GFRNONAA 75 >60  GFRAA 87 >60  ANIONGAP  --  6     Hematology Recent Labs  Lab  08/10/17 1123 08/14/17 0242  WBC 6.2 11.9*  RBC 4.68 4.30  HGB 15.4 13.8  HCT 43.7 40.2  MCV 93 93.5  MCH 32.9 32.1  MCHC 35.2 34.3  RDW 13.2 12.7  PLT 278 250    Cardiac EnzymesNo results for input(s): TROPONINI in the last 168 hours. No results for input(s): TROPIPOC in the last 168 hours.   BNPNo results for input(s): BNP, PROBNP in the last 168 hours.   DDimer No results for input(s): DDIMER in the last 168 hours.   Radiology    No results found.  Cardiac Studies   Cath/ PCI 08/13/17  Patient Profile     67 y.o. male with a history of PAF, prior stroke in 2011, on chronic Xarelto, recently had chest pain and was found to have CAD on coronary CT. He was admitted for cath which revealed significant CFX disease treated with PCI/ DES.  Assessment & Plan    Chest pain c/w angina continued c/p post PCI secondary to loss of small OM  CAD- S/p CFX PCI with DES with loss of a small OM, chest pain post PCI  PAF- Recurrent, converted with IV  Dilt in ED 07/28/17. Low dose beta blocker added (previously not tolerated secondary to psychogenic side effects).  Chronic anticoagulation- Xarelto  Plan: Will mobilize. I don't think NTG will help his chest pain. Not sure he'll be ready for DC today. High dose statin added. Currently on ASA and Plavix. Will resume Xarelto. Continue triple therapy with ASA/Plavix/Xarelto for one month.  Stop ASA in one month. In one year, consider stopping Plavix and restarting ASA along with Xarelto.  For questions or updates, please contact [place] Please consult [email or web address] for contact info under Cardiology/STEMI.      Signed, Kerin Ransom, PA-C  08/14/2017, 7:12 AM    Agree with note written by Kerin Ransom [facility]  Status post circumflex PCI and drug-eluting stenting by Dr. Angelena Form yesterday jailing a small marginal branch.  There is no other significant CAD and there was normal LV function.  This was done because of positive CT FFR.   Patient did have chest pain overnight requiring nitroglycerin and analgesics however this is getting better this morning.  Enzymes are negative.  We will check back in later this afternoon.  If he is pain-free he is amenable to going home.  His exam is benign.  He is on dual and operative therapy and can restart his Xarelto at discharge.   Quay Burow 08/14/2017 9:38 AM

## 2017-08-14 NOTE — Progress Notes (Signed)
Patient ambulated in hall, steady gait, walking independently, denies any s/s intolerance. No shortness of breath, chest pain, lightheadedness or dizziness. Patient stated no pain at 1415 after given oxycodone 5 mg at 1315. Kerin Ransom PA notified and patient and family agreed to discharge at this time.

## 2017-08-14 NOTE — Discharge Instructions (Addendum)
Coronary Angiogram With Stent, Care After °This sheet gives you information about how to care for yourself after your procedure. Your health care provider may also give you more specific instructions. If you have problems or questions, contact your health care provider. °What can I expect after the procedure? °After your procedure, it is common to have: °· Bruising in the area where a small, thin tube (catheter) was inserted. This usually fades within 1-2 weeks. °· Blood collecting in the tissue (hematoma) that may be painful to the touch. It should usually decrease in size and tenderness within 1-2 weeks. ° °Follow these instructions at home: °Insertion area care °· Do not take baths, swim, or use a hot tub until your health care provider approves. °· You may shower 24-48 hours after the procedure or as directed by your health care provider. °· Follow instructions from your health care provider about how to take care of your incision. Make sure you: °? Wash your hands with soap and water before you change your bandage (dressing). If soap and water are not available, use hand sanitizer. °? Change your dressing as told by your health care provider. °? Leave stitches (sutures), skin glue, or adhesive strips in place. These skin closures may need to stay in place for 2 weeks or longer. If adhesive strip edges start to loosen and curl up, you may trim the loose edges. Do not remove adhesive strips completely unless your health care provider tells you to do that. °· Remove the bandage (dressing) and gently wash the catheter insertion site with plain soap and water. °· Pat the area dry with a clean towel. Do not rub the area, because that may cause bleeding. °· Do not apply powder or lotion to the incision area. °· Check your incision area every day for signs of infection. Check for: °? More redness, swelling, or pain. °? More fluid or blood. °? Warmth. °? Pus or a bad smell. °Activity °· Do not drive for 24 hours if you  were given a medicine to help you relax (sedative). °· Do not lift anything that is heavier than 10 lb (4.5 kg) for 5 days after your procedure or as directed by your health care provider. °· Ask your health care provider when it is okay for you: °? To return to work or school. °? To resume usual physical activities or sports. °? To resume sexual activity. °Eating and drinking °· Eat a heart-healthy diet. This should include plenty of fresh fruits and vegetables. °· Avoid the following types of food: °? Food that is high in salt. °? Canned or highly processed food. °? Food that is high in saturated fat or sugar. °? Fried food. °· Limit alcohol intake to no more than 1 drink a day for non-pregnant women and 2 drinks a day for men. One drink equals 12 oz of beer, 5 oz of wine, or 1½ oz of hard liquor. °Lifestyle °· Do not use any products that contain nicotine or tobacco, such as cigarettes and e-cigarettes. If you need help quitting, ask your health care provider. °· Take steps to manage and control your weight. °· Get regular exercise. °· Manage your blood pressure. °· Manage other health problems, such as diabetes. °General instructions °· Take over-the-counter and prescription medicines only as told by your health care provider. Blood thinners may be prescribed after your procedure to improve blood flow through the stent. °· If you need an MRI after your heart stent has been placed, be   sure to tell the health care provider who orders the MRI that you have a heart stent.  Keep all follow-up visits as directed by your health care provider. This is important. Contact a health care provider if:  You have a fever.  You have chills.  You have increased bleeding from the catheter insertion area. Hold pressure on the area. Get help right away if:  You develop chest pain or shortness of breath.  You feel faint or you pass out.  You have unusual pain at the catheter insertion area.  You have redness,  warmth, or swelling at the catheter insertion area.  You have drainage (other than a small amount of blood on the dressing) from the catheter insertion area.  The catheter insertion area is bleeding, and the bleeding does not stop after 30 minutes of holding steady pressure on the area.  You develop bleeding from any other place, such as from your rectum. There may be bright red blood in your urine or stool, or it may appear as black, tarry stool. This information is not intended to replace advice given to you by your health care provider. Make sure you discuss any questions you have with your health care provider. Document Released: 09/27/2004 Document Revised: 12/06/2015 Document Reviewed: 12/06/2015 Elsevier Interactive Patient Education  2018 [place] on my medicine - XARELTO (Rivaroxaban)  Why was Xarelto prescribed for you? Xarelto was prescribed for you to reduce the risk of a blood clot forming that can cause a stroke if you have a medical condition called atrial fibrillation (a type of irregular heartbeat).  What do you need to know about xarelto ? Take your Xarelto ONCE DAILY at the same time every day with your evening meal. If you have difficulty swallowing the tablet whole, you may crush it and mix in applesauce just prior to taking your dose.  Take Xarelto exactly as prescribed by your doctor and DO NOT stop taking Xarelto without talking to the doctor who prescribed the medication.  Stopping without other stroke prevention medication to take the place of Xarelto may increase your risk of developing a clot that causes a stroke.  Refill your prescription before you run out.  After discharge, you should have regular check-up appointments with your healthcare provider that is prescribing your Xarelto.  In the future your dose may need to be changed if your kidney function or weight changes by a significant amount.  What do you do if you miss a dose? If  you are taking Xarelto ONCE DAILY and you miss a dose, take it as soon as you remember on the same day then continue your regularly scheduled once daily regimen the next day. Do not take two doses of Xarelto at the same time or on the same day.   Important Safety Information A possible side effect of Xarelto is bleeding. You should call your healthcare provider right away if you experience any of the following: ? Bleeding from an injury or your nose that does not stop. ? Unusual colored urine (red or dark brown) or unusual colored stools (red or black). ? Unusual bruising for unknown reasons. ? A serious fall or if you hit your head (even if there is no bleeding).  Some medicines may interact with Xarelto and might increase your risk of bleeding while on Xarelto. To help avoid this, consult your healthcare provider or pharmacist prior to using any new prescription or non-prescription medications, including herbals, vitamins, non-steroidal anti-inflammatory drugs (NSAIDs) and  supplements.  This website has more information on Xarelto: [email or web address]-[email or web address]

## 2017-08-14 NOTE — Progress Notes (Signed)
CARDIAC REHAB PHASE I   PRE:  Rate/Rhythm: 69 SR  BP:  Supine: 115/68  Sitting:   Standing:    SaO2: 94% 2L  MODE:  Ambulation: 500 ft   POST:  Rate/Rhythm: 78 SR  BP:  Supine:   Sitting: 132/74  Standing:    SaO2: 95%RA 0935-1030 Pt walked 500 ft on RA with steady gait. CP was a 5 prior and after walk. No increase and he stated felt good to be walking. Education completed with pt and wife who voiced understanding. Stressed importance of plavix with stent. Discussed NTG use, risk factors, ex ed and gave hearth healthy diet. Discussed CRP 2 and will refer to [place] as pt works here in [employer] but lives in [place]. Left off of oxygen.   Graylon Good, RN BSN  08/14/2017 10:25 AM

## 2017-08-14 NOTE — Discharge Summary (Addendum)
Discharge Summary    Patient ID: Stephen King,  MRN: [id number], DOB/AGE: 07-07-1950 67 y.o.  Admit date: 08/13/2017 Discharge date: 08/14/2017  Primary Care Provider: Donella Stade Primary Cardiologist: Candee Furbish, MD  Discharge Diagnoses    Principal Problem:   Unstable angina [facility]) Active Problems:   PAF (paroxysmal atrial fibrillation) ([place])   History of CVA (cerebrovascular accident)   Chronic anticoagulation   CAD S/P percutaneous coronary angioplasty   Allergies Allergies  Allergen Reactions  . Feldene [Piroxicam] Hives    Diagnostic Studies/Procedures    Cath/ PCI 08/13/17 _____________   History of Present Illness     67 y/o male admitted for cath after a coronary CTA done for chest pain revealed significant CAD.    Hospital Course     67 y.o. male with a history of PAF, prior stroke in 2011, on chronic Xarelto, recently had chest pain and was found to have CAD on coronary CT. He was admitted 08/13/17 for cath which revealed significant CFX disease treated with PCI/ DES. He did have a small jailed OM branch secondary to the procedure which caused chest pain post PCI. He is discharged on triple therapy. The plan is to continue his ASA for 30 days then stop it. His Lopressor was cut back to his pre admission dose of 12.5 mg BID secondary to bradycardia.  _____________  Discharge Vitals Blood pressure 110/76, pulse (!) 59, temperature 98.5 F (36.9 C), temperature source Oral, resp. rate 16, height 5\' 10"  (1.778 m), weight 229 lb 15 oz (104.3 kg), SpO2 96 %.  Filed Weights   08/13/17 1001 08/14/17 0400  Weight: 224 lb (101.6 kg) 229 lb 15 oz (104.3 kg)    Labs & Radiologic Studies    CBC Recent Labs    08/14/17 0242  WBC 11.9*  HGB 13.8  HCT 40.2  MCV 93.5  PLT 573   Basic Metabolic Panel Recent Labs    08/14/17 0242  NA 139  K 4.2  CL 107  CO2 26  GLUCOSE 126*  BUN 10  CREATININE 1.02  CALCIUM 8.4*   Liver Function Tests No  results for input(s): AST, ALT, ALKPHOS, BILITOT, PROT, ALBUMIN in the last 72 hours. No results for input(s): LIPASE, AMYLASE in the last 72 hours. Cardiac Enzymes No results for input(s): CKTOTAL, CKMB, CKMBINDEX, TROPONINI in the last 72 hours. BNP Invalid input(s): POCBNP D-Dimer No results for input(s): DDIMER in the last 72 hours. Hemoglobin A1C No results for input(s): HGBA1C in the last 72 hours. Fasting Lipid Panel No results for input(s): CHOL, HDL, LDLCALC, TRIG, CHOLHDL, LDLDIRECT in the last 72 hours. Thyroid Function Tests No results for input(s): TSH, T4TOTAL, T3FREE, THYROIDAB in the last 72 hours.  Invalid input(s): FREET3 _____________  Ct Coronary Morph W/cta Cor W/score W/ca W/cm &/or Wo/cm  Addendum Date: 08/07/2017   ADDENDUM REPORT: 08/07/2017 18:23 CLINICAL DATA:  67 year old male with chest pain. EXAM: Cardiac/Coronary  CT TECHNIQUE: The patient was scanned on a [employer]. FINDINGS: A 120 kV prospective scan was triggered in the descending thoracic aorta at 111 HU's. Axial non-contrast 3 mm slices were carried out through the heart. The data set was analyzed on a dedicated work station and scored using the [place]. Gantry rotation speed was 250 msecs and collimation was .6 mm. No beta blockade and 0.8 mg of sl NTG was given. The 3D data set was reconstructed in 5% intervals of the 67-82 % of the R-R  cycle. Diastolic phases were analyzed on a dedicated work station using MPR, MIP and VRT modes. The patient received 80 cc of contrast. Aorta:  Normal size.  No calcifications.  No dissection. Aortic Valve:  Trileaflet.  No calcifications. Coronary Arteries:  Normal coronary origin.  Right dominance. RCA is a medium size dominant artery that gives rise to PDA and PLVB. There is minimal calcified plaque in the proximal segment with associated stenosis 0-25%. Left main is a very short artery and LAD and LCX arteries originate almost directly from the left  coronary sinus. LAD is a medium size vessel that gives rise to two small diagonal arteries. Proximal LAD has a mild calcified plaque with associated stenosis 25-50%. 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 LCX is a medium size non-dominant artery that gives rise to one large OM1 branch. Proximal LCX artery has long severe, predominantly non-calcified plaque with associated stenosis >70%. Mid and distal LCX artery and OM1 have only minimal plaque. Other findings: Normal pulmonary vein drainage into the left atrium. Normal let atrial appendage without a thrombus. Normal size of the pulmonary artery. IMPRESSION: 1. Coronary calcium score of 19. This was 74 percentile for age and sex matched control. 2. Normal coronary origin with right dominance. 3. Proximal LCX artery before the takeoff of a large OM1 branch has long severe, predominantly non-calcified plaque with associated stenosis >70%. Cardiac catheterization is recommended. 4.  Mildly dilated pulmonary artery measuring 32 mm. Electronically Signed   By: Ena Dawley   On: 08/07/2017 18:23   Result Date: 08/07/2017 EXAM: OVER-READ INTERPRETATION  CT CHEST The following report is an over-read performed by radiologist Dr. [name] of [facility], [place] on 08/07/2017. This over-read does not include interpretation of cardiac or coronary anatomy or pathology. The coronary calcium score/coronary CTA interpretation by the cardiologist is attached. COMPARISON:  None. FINDINGS: Aortic atherosclerosis. 6 mm right middle lobe nodule (axial image 29 of series 12), stable dating back to 2004, considered definitively benign. Within the visualized portions of the thorax there are no other larger more suspicious appearing pulmonary nodules or masses, there is no acute consolidative airspace disease, no pleural effusions, no pneumothorax and no lymphadenopathy. Visualized portions of the upper abdomen demonstrates mild diffuse low attenuation throughout the  visualized hepatic parenchyma, indicative of hepatic steatosis. There are no aggressive appearing lytic or blastic lesions noted in the visualized portions of the skeleton. IMPRESSION: 1.  Aortic Atherosclerosis (ICD10-I70.0). 2. Hepatic steatosis. Electronically Signed: By: [name] M.D. On: 08/07/2017 16:18   Ct Coronary Fractional Flow [place] Data Prep  Result Date: 08/10/2017 EXAM: FF/RCT ANALYSIS FINDINGS: FFRct analysis was performed on the original cardiac CT angiogram dataset. Diagrammatic representation of the FFRct analysis is provided in a separate PDF document in PACS. This dictation was created using the PDF document and an interactive 3D model of the results. 3D model is not available in the EMR/PACS. Normal FFR range is >0.80. 1. Left Main:  No significant stenosis. 2. LAD: No significant stenosis. 3. LCX: Proximal CT FFR: 0.99.  Proximal to mid: 0.63. 4. RCA: No significant stenosis. IMPRESSION: 1. CT FFR analysis showed significant stenosis in the proximal to mid LCX artery. Cardiac catheterization is recommended. Electronically Signed   By: Ena Dawley   On: 08/10/2017 09:15   Disposition   Pt is being discharged home today in good condition.  Follow-up Plans & Appointments    Follow-up Information    Jerline Pain, MD Follow up.   Specialty:  Cardiology Why:  office will contact you Contact information: [address] [place] [address] [phone]          Discharge Instructions    Amb Referral to Cardiac Rehabilitation   Complete by:  As directed    Referring to [facility] also   Diagnosis:  Coronary Stents      Discharge Medications   Allergies as of 08/14/2017      Reactions   Feldene [piroxicam] Hives      Medication List    TAKE these medications   acetaminophen 325 MG tablet Commonly known as:  TYLENOL Take 2 tablets (650 mg total) by mouth every 6 (six) hours as needed for mild pain or headache. What changed:     medication strength  how much to take  when to take this  reasons to take this   angioplasty book Misc 1 each by Does not apply route once for 1 dose.   aspirin 81 MG chewable tablet Chew 1 tablet (81 mg total) by mouth daily. Start taking on:  08/15/2017   atorvastatin 80 MG tablet Commonly known as:  LIPITOR Take 1 tablet (80 mg total) by mouth daily at 6 PM.   b complex vitamins tablet Take 1 tablet by mouth daily.   cholecalciferol 1000 units tablet Commonly known as:  VITAMIN D Take 1,000 Units by mouth daily.   clopidogrel 75 MG tablet Commonly known as:  PLAVIX Take 1 tablet (75 mg total) by mouth daily with breakfast. Start taking on:  08/15/2017   FISH OIL PO Take 1 capsule by mouth daily.   metoprolol tartrate 25 MG tablet Commonly known as:  LOPRESSOR Take 0.5 tablets (12.5 mg total) by mouth 2 (two) times daily.   nitroGLYCERIN 0.4 MG SL tablet Commonly known as:  NITROSTAT Place 0.4 mg under the tongue every 5 (five) minutes x 3 doses as needed for chest pain.   oxyCODONE 5 MG immediate release tablet Commonly known as:  Oxy IR/ROXICODONE Take 1 tablet (5 mg total) by mouth every 6 (six) hours as needed for moderate pain.   XARELTO 20 MG Tabs tablet Generic drug:  rivaroxaban TAKE 1 TABLET BY MOUTH  DAILY WITH SUPPER        Aspirin prescribed at discharge?  Yes High Intensity Statin Prescribed? (Lipitor 40-80mg  or Crestor 20-40mg ): Yes Beta Blocker Prescribed? Yes For EF <40%, was ACEI/ARB Prescribed? No: NA ADP Receptor Inhibitor Prescribed? (i.e. Plavix etc.-Includes Medically Managed Patients): Yes For EF <40%, Aldosterone Inhibitor Prescribed? No: NA Was EF assessed during THIS hospitalization? Yes Was Cardiac Rehab II ordered? (Included Medically managed Patients): Yes   Outstanding Labs/Studies    Duration of Discharge Encounter   Greater than 30 minutes including physician time.  Signed, [address]  [place] 08/14/2017, 1:59 PM  Agree with note written by Kerin Ransom [facility]  Mr. Tetreault is stable for discharge this morning.  He is status post circumflex stenting by Dr. Angelena Form after a positive CT FFR.  He was complaining some chest pain yesterday which has resolved this morning.  Enzymes are negative.  His exam is benign.  Follow-up with Dr. Marlou Porch.   Quay Burow 08/14/2017 2:33 PM

## 2017-08-14 NOTE — Progress Notes (Signed)
Patient had O2 2 liters via [place]. Removed and assessed. O2 sats => 90 % when awake. When he fell asleep his O2 sats were 88-89% O2 2 liters applied via [place] for low O2 sats.

## 2017-08-15 ENCOUNTER — Observation Stay (HOSPITAL_COMMUNITY)
Admission: EM | Admit: 2017-08-15 | Discharge: 2017-08-16 | Disposition: A | Discharge status: Home / Self Care | Payer: Medicare Other | Attending: Internal Medicine | Admitting: Internal Medicine

## 2017-08-15 ENCOUNTER — Emergency Department (HOSPITAL_COMMUNITY): Payer: Medicare Other

## 2017-08-15 ENCOUNTER — Inpatient Hospital Stay: Admission: AD | Admit: 2017-08-15 | Payer: Self-pay | Admitting: Cardiology

## 2017-08-15 DIAGNOSIS — R7989 Other specified abnormal findings of blood chemistry: Secondary | ICD-10-CM

## 2017-08-15 DIAGNOSIS — Z7901 Long term (current) use of anticoagulants: Secondary | ICD-10-CM | POA: Diagnosis not present

## 2017-08-15 DIAGNOSIS — I2510 Atherosclerotic heart disease of native coronary artery without angina pectoris: Secondary | ICD-10-CM

## 2017-08-15 DIAGNOSIS — Z9861 Coronary angioplasty status: Secondary | ICD-10-CM

## 2017-08-15 DIAGNOSIS — I4891 Unspecified atrial fibrillation: Secondary | ICD-10-CM | POA: Diagnosis not present

## 2017-08-15 DIAGNOSIS — Z87891 Personal history of nicotine dependence: Secondary | ICD-10-CM | POA: Diagnosis not present

## 2017-08-15 DIAGNOSIS — R778 Other specified abnormalities of plasma proteins: Secondary | ICD-10-CM

## 2017-08-15 DIAGNOSIS — Z87442 Personal history of urinary calculi: Secondary | ICD-10-CM | POA: Diagnosis not present

## 2017-08-15 DIAGNOSIS — Z7982 Long term (current) use of aspirin: Secondary | ICD-10-CM | POA: Insufficient documentation

## 2017-08-15 DIAGNOSIS — Z955 Presence of coronary angioplasty implant and graft: Secondary | ICD-10-CM | POA: Insufficient documentation

## 2017-08-15 DIAGNOSIS — I480 Paroxysmal atrial fibrillation: Secondary | ICD-10-CM | POA: Diagnosis not present

## 2017-08-15 DIAGNOSIS — I959 Hypotension, unspecified: Secondary | ICD-10-CM | POA: Diagnosis not present

## 2017-08-15 DIAGNOSIS — M170 Bilateral primary osteoarthritis of knee: Secondary | ICD-10-CM | POA: Diagnosis not present

## 2017-08-15 DIAGNOSIS — R9431 Abnormal electrocardiogram [ECG] [EKG]: Secondary | ICD-10-CM | POA: Diagnosis not present

## 2017-08-15 DIAGNOSIS — N529 Male erectile dysfunction, unspecified: Secondary | ICD-10-CM | POA: Diagnosis not present

## 2017-08-15 DIAGNOSIS — R748 Abnormal levels of other serum enzymes: Secondary | ICD-10-CM | POA: Diagnosis not present

## 2017-08-15 DIAGNOSIS — Z8673 Personal history of transient ischemic attack (TIA), and cerebral infarction without residual deficits: Secondary | ICD-10-CM

## 2017-08-15 DIAGNOSIS — R079 Chest pain, unspecified: Secondary | ICD-10-CM | POA: Diagnosis not present

## 2017-08-15 DIAGNOSIS — Z79899 Other long term (current) drug therapy: Secondary | ICD-10-CM | POA: Diagnosis not present

## 2017-08-15 DIAGNOSIS — I97190 Other postprocedural cardiac functional disturbances following cardiac surgery: Secondary | ICD-10-CM | POA: Diagnosis not present

## 2017-08-15 LAB — CBC WITH DIFFERENTIAL/PLATELET
ABS IMMATURE GRANULOCYTES: 0 10*3/uL (ref 0.0–0.1)
Basophils Absolute: 0 10*3/uL (ref 0.0–0.1)
Basophils Relative: 0 %
Eosinophils Absolute: 0 10*3/uL (ref 0.0–0.7)
Eosinophils Relative: 0 %
HEMATOCRIT: 43.9 % (ref 39.0–52.0)
HEMOGLOBIN: 14.9 g/dL (ref 13.0–17.0)
Immature Granulocytes: 0 %
LYMPHS PCT: 17 %
Lymphs Abs: 1.9 10*3/uL (ref 0.7–4.0)
MCH: 31.8 pg (ref 26.0–34.0)
MCHC: 33.9 g/dL (ref 30.0–36.0)
MCV: 93.6 fL (ref 78.0–100.0)
MONO ABS: 1.1 10*3/uL — AB (ref 0.1–1.0)
MONOS PCT: 10 %
NEUTROS ABS: 8.3 10*3/uL — AB (ref 1.7–7.7)
Neutrophils Relative %: 73 %
Platelets: 225 10*3/uL (ref 150–400)
RBC: 4.69 MIL/uL (ref 4.22–5.81)
RDW: 12.7 % (ref 11.5–15.5)
WBC: 11.4 10*3/uL — ABNORMAL HIGH (ref 4.0–10.5)

## 2017-08-15 LAB — BASIC METABOLIC PANEL
ANION GAP: 9 (ref 5–15)
BUN: 11 mg/dL (ref 6–20)
CHLORIDE: 108 mmol/L (ref 101–111)
CO2: 20 mmol/L — ABNORMAL LOW (ref 22–32)
CREATININE: 0.93 mg/dL (ref 0.61–1.24)
Calcium: 8.2 mg/dL — ABNORMAL LOW (ref 8.9–10.3)
GFR calc non Af Amer: >60 mL/min (ref >60)
Glucose, Bld: 100 mg/dL — ABNORMAL HIGH (ref 65–99)
POTASSIUM: 3.9 mmol/L (ref 3.5–5.1)
Sodium: 137 mmol/L (ref 135–145)

## 2017-08-15 LAB — I-STAT TROPONIN, ED: Troponin i, poc: 3.34 ng/mL (ref 0.00–0.08)

## 2017-08-15 LAB — HEPARIN LEVEL (UNFRACTIONATED): Heparin Unfractionated: 0.35 IU/mL (ref 0.30–0.70)

## 2017-08-15 LAB — APTT: APTT: 28 s (ref 24–36)

## 2017-08-15 MED ORDER — AMIODARONE HCL IN DEXTROSE 360-4.14 MG/200ML-% IV SOLN
0.50 | INTRAVENOUS | Status: DC
Start: ? — End: 2017-08-15

## 2017-08-15 MED ORDER — AMIODARONE HCL IN DEXTROSE 360-4.14 MG/200ML-% IV SOLN
1.00 | INTRAVENOUS | Status: DC
Start: ? — End: 2017-08-15

## 2017-08-15 NOTE — H&P (Signed)
Cardiology Admission History and Physical:   Patient ID: QUY LOTTS; MRN: [id number]; DOB: 1951/03/01   Admission date: 08/15/2017  Primary Care Provider: Lavada Mesi Primary Cardiologist: Marlou Porch  Chief Complaint:  AF  Patient Profile:   Stephen King is a 67 y.o. male with a history of CAD, AF, prior stroke and recent hospitalization for PCI who presents as transfer from OSH for [facility].   History of Present Illness:   Stephen King is a 67 y.o. male with a history of CAD, AF, prior stroke and recent hospitalization for PCI who presents as transfer from OSH for [place].   The patient was hospitalized 5/23/-5/24 for PCI after he was found to have CAD on coronary CT ordered in the setting of chest pain. He underwent successful PCI to the LCx, although a small OM branch was jailed and caused post-procedural chest pain. He was discharged yesterday on triple therapy with plans to stop the ASA after 30 days. His metoprolol was decreased to 12.5 BID due to bradycardia.  He presented to [facility] ED this evening with AFRVR with the HR in the 170s. ECG showed ST depressions in the anterior leads with inferior TWI. He was initially hypotensive with SBP in the 70s. Cardioversion was attempted x2 at 100 J but was unsuccessful. He had no significant chest pain, although he did report nausea at the OSH. Troponin was noted to be elevated at 2.7. He was started on an amiodarone gtt and he was transferred to [facility] for further management.  The patient was transferred to [facility] ED while awaiting a bed. In our ED, he converted to NSR. Labs were notable for troponin of 3.34. He was started on a heparin gtt. On my evaluation, the patient reports fatigue but denies chest pain, dyspnea, LE edema or any other symptoms.    Past Medical History:  Diagnosis Date  . Arthritis    "?right knee" (08/13/2017)  . Dysrhythmia    PAF  . Hernia, umbilical   . History of hiatal hernia   . History  of kidney stones   . Stroke [facility]) 12/2009   "has to watch where he's walking since; more of a spacial thing resulting from stroke" (08/13/2017)    Past Surgical History:  Procedure Laterality Date  . ABDOMINAL HERNIA REPAIR     "has a mesh in his abdomen"  . ANTERIOR CERVICAL DECOMP/DISCECTOMY FUSION  10/2005   C6-7  . BACK SURGERY    . CARDIAC CATHETERIZATION  2003  . CATARACT EXTRACTION W/ INTRAOCULAR LENS  IMPLANT, BILATERAL Bilateral   . CORONARY ANGIOPLASTY WITH STENT PLACEMENT  08/13/2017  . CORONARY STENT INTERVENTION N/A 08/13/2017   Procedure: CORONARY STENT INTERVENTION;  Surgeon: Burnell Blanks, MD;  Location: [facility];  Service: Cardiovascular;  Laterality: N/A;  . EXCISIONAL HEMORRHOIDECTOMY    . HERNIA REPAIR    . LEFT HEART CATH AND CORONARY ANGIOGRAPHY N/A 08/13/2017   Procedure: LEFT HEART CATH AND CORONARY ANGIOGRAPHY;  Surgeon: Burnell Blanks, MD;  Location: [facility];  Service: Cardiovascular;  Laterality: N/A;  . SHOULDER ARTHROSCOPY WITH ROTATOR CUFF REPAIR Right 10/30/2014   Procedure: SHOULDER ARTHROSCOPY WITH ROTATOR CUFF REPAIR;  Surgeon: Tania Ade, MD;  Location: [facility];  Service: Orthopedics;  Laterality: Right;  . SHOULDER ARTHROSCOPY WITH SUBACROMIAL DECOMPRESSION Right 10/30/2014   Procedure: Right shoulder arthroscopy rotator cuff repair, subacromial decompression;  Surgeon: Tania Ade, MD;  Location: [facility];  Service: Orthopedics;  Laterality: Right;  Right shoulder arthrscopy rotator cuff repair, subacromial decompression     Medications Prior to Admission: Prior to Admission medications   Medication Sig Start Date End Date Taking? Authorizing Provider  acetaminophen (TYLENOL) 325 MG tablet Take 2 tablets (650 mg total) by mouth every 6 (six) hours as needed for mild pain or headache. 08/14/17   Erlene Quan, PA-C  aspirin 81 MG chewable tablet Chew 1 tablet (81 mg total)  by mouth daily. 08/15/17   Erlene Quan, PA-C  atorvastatin (LIPITOR) 80 MG tablet Take 1 tablet (80 mg total) by mouth daily at 6 PM. 08/14/17   Kilroy, Doreene Burke, PA-C  b complex vitamins tablet Take 1 tablet by mouth daily.    [provider]  cholecalciferol (VITAMIN D) 1000 units tablet Take 1,000 Units by mouth daily.    [provider]  clopidogrel (PLAVIX) 75 MG tablet Take 1 tablet (75 mg total) by mouth daily with breakfast. 08/15/17   Erlene Quan, PA-C  metoprolol tartrate (LOPRESSOR) 25 MG tablet Take 0.5 tablets (12.5 mg total) by mouth 2 (two) times daily. 08/05/17   Jerline Pain, MD  nitroGLYCERIN (NITROSTAT) 0.4 MG SL tablet Place 0.4 mg under the tongue every 5 (five) minutes x 3 doses as needed for chest pain.    [provider]  Omega-3 Fatty Acids (FISH OIL PO) Take 1 capsule by mouth daily.    [provider]  oxyCODONE (OXY IR/ROXICODONE) 5 MG immediate release tablet Take 1 tablet (5 mg total) by mouth every 6 (six) hours as needed for moderate pain. 08/14/17   Kilroy, Doreene Burke, PA-C  XARELTO 20 MG TABS tablet TAKE 1 TABLET BY MOUTH  DAILY WITH SUPPER 05/18/17   Jerline Pain, MD     Allergies:    Allergies  Allergen Reactions  . Feldene [Piroxicam] Hives    Social History:   Social History   Socioeconomic History  . Marital status: Married    Spouse name: Not on file  . Number of children: Not on file  . Years of education: Not on file  . Highest education level: Not on file  Occupational History  . Not on file  Social Needs  . Financial resource strain: Not on file  . Food insecurity:    Worry: Not on file    Inability: Not on file  . Transportation needs:    Medical: Not on file    Non-medical: Not on file  Tobacco Use  . Smoking status: Former Smoker    Years: 27.00    Types: Cigarettes    Last attempt to quit: 03/25/1999    Years since quitting: 18.4  . Smokeless tobacco: Never Used  Substance and Sexual Activity    . Alcohol use: No  . Drug use: No  . Sexual activity: Not Currently  Lifestyle  . Physical activity:    Days per week: Not on file    Minutes per session: Not on file  . Stress: Not on file  Relationships  . Social connections:    Talks on phone: Not on file    Gets together: Not on file    Attends religious service: Not on file    Active member of club or organization: Not on file    Attends meetings of clubs or organizations: Not on file    Relationship status: Not on file  . Intimate partner violence:    Fear of  current or ex partner: Not on file    Emotionally abused: Not on file    Physically abused: Not on file    Forced sexual activity: Not on file  Other Topics Concern  . Not on file  Social History Narrative  . Not on file    Family History:    The patient's family history includes Alcohol abuse in his father and mother; Arrhythmia in his mother; Colon cancer in his father; Stroke in his maternal grandfather.    ROS:  Please see the history of present illness.  All other ROS reviewed and negative.     Physical Exam/Data:   Vitals:   08/15/17 2149 08/15/17 2150  SpO2: 97%   Weight:  103.9 kg (229 lb)  Height:  5\' 10"  (1.778 m)   No intake or output data in the 24 hours ending 08/15/17 2230 Filed Weights   08/15/17 2150  Weight: 103.9 kg (229 lb)   Body mass index is 32.86 kg/m.  General:  Well nourished, well developed, in no acute distress  HEENT: normal Lymph: no adenopathy Neck: no JVD Cardiac:  normal S1, S2; RRR; no murmur   Lungs:  clear to auscultation bilaterally, no wheezing, rhonchi or rales  Abd: soft, nontender, no hepatomegaly  Ext: no edema Musculoskeletal:  No deformities, BUE and BLE strength normal and equal Skin: warm and dry  Neuro:  No focal abnormalities noted Psych:  Normal affect    EKG:  The ECG that was done and was personally reviewed and demonstrates NSR.  Relevant CV Studies: LHC 5/29 1. Severe stenosis proximal  Circumflex artery 2. Successful PTCA/DES x 1 proximal Circumflex but unfortunately a very small caliber obtuse marginal branch was jailed by the stent and flow was lost down this branch. The branch was 0.5-0.75 mm and too small for a balloon or stent. He had severe chest pain post stent deployment felt to be due to the loss of flow down this small branch.  3. Mild non-obstructive plaque in the RCA 4. Normal LV systolic function  TTE 0/0762 - Left ventricle: The cavity size was normal. There was mild concentric hypertrophy. Systolic function was normal. The estimated ejection fraction was in the range of 60% to 65%. Wall motion was normal; there were no regional wall motion abnormalities. - Mitral valve: There was systolic anterior motion. There was mild regurgitation. - Right ventricle: The cavity size was mildly dilated. Wall thickness was normal. - Right atrium: The atrium was mildly dilated.  Laboratory Data:  Chemistry Recent Labs  Lab 08/10/17 1123 08/14/17 0242  NA 141 139  K 4.6 4.2  CL 103 107  CO2 25 26  GLUCOSE 88 126*  BUN 9 10  CREATININE 1.03 1.02  CALCIUM 9.6 8.4*  GFRNONAA 75 >60  GFRAA 87 >60  ANIONGAP  --  6    No results for input(s): PROT, ALBUMIN, AST, ALT, ALKPHOS, BILITOT in the last 168 hours. Hematology Recent Labs  Lab 08/10/17 1123 08/14/17 0242  WBC 6.2 11.9*  RBC 4.68 4.30  HGB 15.4 13.8  HCT 43.7 40.2  MCV 93 93.5  MCH 32.9 32.1  MCHC 35.2 34.3  RDW 13.2 12.7  PLT 278 250   Cardiac EnzymesNo results for input(s): TROPONINI in the last 168 hours. No results for input(s): TROPIPOC in the last 168 hours.  BNPNo results for input(s): BNP, PROBNP in the last 168 hours.  DDimer No results for input(s): DDIMER in the last 168 hours.  Radiology/Studies:  Dg Chest Portable 1 View  Result Date: 08/15/2017 CLINICAL DATA:  Chest pain EXAM: PORTABLE CHEST 1 VIEW COMPARISON:  08/21/2013 FINDINGS: There is no focal consolidation.  There is mild hazy left basilar airspace disease likely reflecting atelectasis. There is no pleural effusion or pneumothorax. The heart and mediastinal contours are unremarkable. The osseous structures are unremarkable. IMPRESSION: Mild left basilar atelectasis. Otherwise no acute cardiopulmonary disease. Electronically Signed   By: Kathreen Devoid   On: 08/15/2017 22:22    Assessment and Plan:   pAF The patient has known pAF and is admitted after presenting to OSH ED with AFRVR with HR 170s. DCCV was attempted unsuccessfully given associated hypotension, but he has now converted to NSR after initiation of amiodarone. He has had some reported side effects of beta blockers previously, although he has been restarted on low dose metoprolol.  -Continue to monitor on telemetry -Will not continue amiodarone at this time. Will continue home metoprolol and titrate if he can tolerate this. Otherwise, may need consideration of antiarrhythmic therapy -Heparin initiated in the ED. Low suspicion for recurrent ACS, but will continue while trending troponin and symptoms. Can convert back to home NOAC in AM if stable.  Elevated troponin CAD with recent PCI to LCX The patient had troponin of 2.7 at OSH that has uptrended to 3.3 here. He is s/p recent LHC with PCI. He does not have chest pain or other symptoms, therefore his troponin elevation is unlikely to be due to stent thrombosis or new ACS event. Rather, suspect expected injury from PCI and/or demand ischemia from [facility].  -Will continue to trend troponin -Heparin gtt initiated in ED. Will continue for now but can likely convert back to NOAC -Continue ASA, P2Y12 -Continue statin   Hx Stroke -Continue antithrombotic regimen as above  Severity of Illness: The appropriate patient status for this patient is OBSERVATION. Observation status is judged to be reasonable and necessary in order to provide the required intensity of service to ensure the patient's safety.  The patient's presenting symptoms, physical exam findings, and initial radiographic and laboratory data in the context of their medical condition is felt to place them at decreased risk for further clinical deterioration. Furthermore, it is anticipated that the patient will be medically stable for discharge from the hospital within 2 midnights of admission. The following factors support the patient status of observation.   " The patient's presenting symptoms include AF. " The physical exam findings include n/a. " The initial radiographic and laboratory data are elevated troponin.     For questions or updates, please contact [place] Please consult [email or web address] for contact info under Cardiology/STEMI.    Signed, Nila Nephew, MD  08/15/2017 10:30 PM

## 2017-08-15 NOTE — ED Provider Notes (Signed)
[facility] EMERGENCY DEPARTMENT Provider Note   CSN: [id number] Arrival date & time: 08/15/17  2138     History   Chief Complaint Chief Complaint  Patient presents with  . Atrial Fibrillation    HPI Stephen King is a 67 y.o. male.  HPI Patient is a 67 year old male with history as below, notable for left heart cath and stent placement 2 days ago, who presents as a transfer from [place].  He woke up this morning having severe chest pain.  He was found by EMS to be in A. fib with RVR.  His initial rate was in the 180s.  He was reportedly hypotensive.  They attempted synchronous cardioversion twice without success.  He was then taken to a nearby hospital.  There, his troponin was elevated to 2.5.  He was started on an amiodarone infusion.  His heart rate has continued to be in atrial fibrillation.  On arrival, his heart rate is in the 140s.  His chest pain has largely resolved.  He denies any shortness of breath.  He denies any fever, chills, cough.  He had been well since his left heart cath until this morning.  Past Medical History:  Diagnosis Date  . Arthritis    "?right knee" (08/13/2017)  . Dysrhythmia    PAF  . Hernia, umbilical   . History of hiatal hernia   . History of kidney stones   . Stroke [facility]) 12/2009   "has to watch where he's walking since; more of a spacial thing resulting from stroke" (08/13/2017)    Patient Active Problem List   Diagnosis Date Noted  . CAD S/P percutaneous coronary angioplasty 08/14/2017  . Unstable angina ([place])   . Atrial flutter ([place]) 07/28/2017  . No energy 08/11/2016  . Malaise 08/11/2016  . Multiple joint pain 03/10/2016  . Weakness 03/10/2016  . Myalgia 03/10/2016  . left knee pain 10/26/2015  . Pre-operative cardiovascular examination 10/27/2014  . Acute bronchitis 10/19/2014  . Right shoulder pain 11/04/2013  . 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 Primary osteoarthritis of both knees 10/07/2013  . History of CVA  (cerebrovascular accident) 10/06/2013  . Chronic anticoagulation 10/06/2013  . PAF (paroxysmal atrial fibrillation) ([place]) 08/29/2013  . Atypical nevus 08/29/2013  . Chest pain 08/21/2013  . Atrial fibrillation with RVR ([place]) 08/21/2013  . 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 Mass of right side of neck 06/06/2013  . 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 Urgency of urination 08/26/2011  . PARESTHESIA 12/24/2010  . HEAD TRAUMA, CLOSED 12/24/2010    Past Surgical History:  Procedure Laterality Date  . ABDOMINAL HERNIA REPAIR     "has a mesh in his abdomen"  . ANTERIOR CERVICAL DECOMP/DISCECTOMY FUSION  10/2005   C6-7  . BACK SURGERY    . CARDIAC CATHETERIZATION  2003  . CATARACT EXTRACTION W/ INTRAOCULAR LENS  IMPLANT, BILATERAL Bilateral   . CORONARY ANGIOPLASTY WITH STENT PLACEMENT  08/13/2017  . CORONARY STENT INTERVENTION N/A 08/13/2017   Procedure: CORONARY STENT INTERVENTION;  Surgeon: Burnell Blanks, MD;  Location: [place] [facility];  Service: Cardiovascular;  Laterality: N/A;  . EXCISIONAL HEMORRHOIDECTOMY    . HERNIA REPAIR    . LEFT HEART CATH AND CORONARY ANGIOGRAPHY N/A 08/13/2017   Procedure: LEFT HEART CATH AND CORONARY ANGIOGRAPHY;  Surgeon: Burnell Blanks, MD;  Location: [place] [facility];  Service: Cardiovascular;  Laterality: N/A;  . SHOULDER ARTHROSCOPY WITH ROTATOR CUFF REPAIR Right 10/30/2014   Procedure: SHOULDER ARTHROSCOPY  WITH ROTATOR CUFF REPAIR;  Surgeon: Tania Ade, MD;  Location: [place];  Service: Orthopedics;  Laterality: Right;  . SHOULDER ARTHROSCOPY WITH SUBACROMIAL DECOMPRESSION Right 10/30/2014   Procedure: Right shoulder arthroscopy rotator cuff repair, subacromial decompression;  Surgeon: Tania Ade, MD;  Location: [place];  Service: Orthopedics;  Laterality: Right;  Right shoulder arthrscopy rotator cuff repair, subacromial decompression        Home Medications    Prior to  Admission medications   Medication Sig Start Date End Date Taking? Authorizing Provider  acetaminophen (TYLENOL) 325 MG tablet Take 2 tablets (650 mg total) by mouth every 6 (six) hours as needed for mild pain or headache. 08/14/17  Yes Kilroy, Doreene Burke, PA-C  aspirin 81 MG chewable tablet Chew 1 tablet (81 mg total) by mouth daily. 08/15/17  Yes Kilroy, Luke K, PA-C  atorvastatin (LIPITOR) 80 MG tablet Take 1 tablet (80 mg total) by mouth daily at 6 PM. 08/14/17  Yes Kilroy, Lurena Joiner K, PA-C  b complex vitamins tablet Take 1 tablet by mouth daily.   Yes [provider]  cholecalciferol (VITAMIN D) 1000 units tablet Take 1,000 Units by mouth daily.   Yes [provider]  clopidogrel (PLAVIX) 75 MG tablet Take 1 tablet (75 mg total) by mouth daily with breakfast. 08/15/17  Yes Kilroy, Luke K, PA-C  metoprolol tartrate (LOPRESSOR) 25 MG tablet Take 0.5 tablets (12.5 mg total) by mouth 2 (two) times daily. 08/05/17  Yes Jerline Pain, MD  nitroGLYCERIN (NITROSTAT) 0.4 MG SL tablet Place 0.4 mg under the tongue every 5 (five) minutes x 3 doses as needed for chest pain.   Yes [provider]  Omega-3 Fatty Acids (FISH OIL PO) Take 1 capsule by mouth daily.   Yes [provider]  oxyCODONE (OXY IR/ROXICODONE) 5 MG immediate release tablet Take 1 tablet (5 mg total) by mouth every 6 (six) hours as needed for moderate pain. 08/14/17  Yes Kilroy, Luke K, PA-C  XARELTO 20 MG TABS tablet TAKE 1 TABLET BY MOUTH  DAILY WITH SUPPER 05/18/17  Yes Jerline Pain, MD    Family History Family History  Problem Relation Age of Onset  . Alcohol abuse Mother   . Arrhythmia Mother   . Colon cancer Father   . Alcohol abuse Father   . Stroke Maternal Grandfather     Social History Social History   Tobacco Use  . Smoking status: Former Smoker    Years: 27.00    Types: Cigarettes    Last attempt to quit: 03/25/1999    Years since quitting: 18.4  . Smokeless tobacco: Never Used    Substance Use Topics  . Alcohol use: No  . Drug use: No     Allergies   Feldene [piroxicam]   Review of Systems Review of Systems  Constitutional: Negative for chills and fever.  HENT: Negative for ear pain and sore throat.   Eyes: Negative for pain and visual disturbance.  Respiratory: Negative for cough and shortness of breath.   Cardiovascular: Positive for chest pain. Negative for palpitations.  Gastrointestinal: Negative for abdominal pain and vomiting.  Genitourinary: Negative for dysuria and hematuria.  Musculoskeletal: Negative for arthralgias and back pain.  Skin: Negative for color change and rash.  Neurological: Negative for seizures and syncope.  All other systems reviewed and are negative.    Physical Exam Updated Vital Signs BP 110/67   Pulse 67   Resp 15   Ht 5'  10" (1.778 m)   Wt 103.9 kg (229 lb)   SpO2 94%   BMI 32.86 kg/m   Physical Exam  Constitutional: He appears well-developed and well-nourished.  HENT:  Head: Normocephalic and atraumatic.  Eyes: Conjunctivae are normal.  Neck: Neck supple.  Cardiovascular: Normal rate and regular rhythm.  No murmur heard. Pulmonary/Chest: Effort normal and breath sounds normal. No respiratory distress.  Abdominal: Soft. There is no tenderness.  Musculoskeletal: He exhibits no edema.  Neurological: He is alert.  Skin: Skin is warm and dry.  Psychiatric: He has a normal mood and affect.  Nursing note and vitals reviewed.    ED Treatments / Results  Labs (all labs ordered are listed, but only abnormal results are displayed) Labs Reviewed  CBC WITH DIFFERENTIAL/PLATELET - Abnormal; Notable for the following components:      Result Value   WBC 11.4 (*)    Neutro Abs 8.3 (*)    Monocytes Absolute 1.1 (*)    All other components within normal limits  BASIC METABOLIC PANEL - Abnormal; Notable for the following components:   CO2 20 (*)    Glucose, Bld 100 (*)    Calcium 8.2 (*)    All other  components within normal limits  I-STAT TROPONIN, ED - Abnormal; Notable for the following components:   Troponin i, poc 3.34 (*)    All other components within normal limits  APTT  HEPARIN LEVEL (UNFRACTIONATED)    EKG None  Radiology Dg Chest Portable 1 View  Result Date: 08/15/2017 CLINICAL DATA:  Chest pain EXAM: PORTABLE CHEST 1 VIEW COMPARISON:  08/21/2013 FINDINGS: There is no focal consolidation. There is mild hazy left basilar airspace disease likely reflecting atelectasis. There is no pleural effusion or pneumothorax. The heart and mediastinal contours are unremarkable. The osseous structures are unremarkable. IMPRESSION: Mild left basilar atelectasis. Otherwise no acute cardiopulmonary disease. Electronically Signed   By: Kathreen Devoid   On: 08/15/2017 22:22    Procedures Procedures (including critical care time)  Medications Ordered in ED Medications - No data to display   Initial Impression / Assessment and Plan / ED Course  I have reviewed the triage vital signs and the nursing notes.  Pertinent labs & imaging results that were available during my care of the patient were reviewed by me and considered in my medical decision making (see chart for details).    Patient is a 67 year old male with history as above who presents as a transfer from [facility] due to A. fib with RVR in the setting of a recent left heart cath performed here with stenting to his left circumflex on Thursday.  On arrival, patient is initially in A. fib with heart rate around the 130s to 140s.  However, during my exam, he converted to normal sinus rhythm.  I have consulted cardiology.  Given his troponin elevation, I will start heparin drip.  Cardiology will admit the patient.  Final Clinical Impressions(s) / ED Diagnoses   Final diagnoses:  Atrial fibrillation with RVR ([place])  Elevated troponin    ED Discharge Orders    None       Clifton James, MD 08/15/17 2323    Margette Fast,  MD 08/15/17 2340

## 2017-08-15 NOTE — ED Triage Notes (Signed)
Pt arrived by [facility]. Pt was transdported to local hospital in [place] by local EMS. While EMS transport pt was cardioverted twice, given adenosine 6mg  and 12mg . [place] transported pt to this facility on an  Amiodarone drip at loading dose 1mg /min. pts initial heart rate at home was 180bpm. Pt heart rate on transport to this facility was 130bmp

## 2017-08-15 NOTE — Progress Notes (Signed)
ANTICOAGULATION CONSULT NOTE - Initial Consult  Pharmacy Consult for Heparin Indication: chest pain/ACS  Allergies  Allergen Reactions  . Feldene [Piroxicam] Hives    Patient Measurements: Height: 5\' 10"  (177.8 cm) Weight: 229 lb (103.9 kg) IBW/kg (Calculated) : 73 Heparin Dosing Weight: 100 kg  Vital Signs: BP: 98/69 (05/25 2330) Pulse Rate: 67 (05/25 2330)  Labs: Recent Labs    08/14/17 0242 08/15/17 2221 08/15/17 2252 08/15/17 2312  HGB 13.8  --  14.9  --   HCT 40.2  --  43.9  --   PLT 250  --  225  --   APTT  --   --   --  28  HEPARINUNFRC  --   --   --  0.35  CREATININE 1.02 0.93  --   --     Estimated Creatinine Clearance: 94.4 mL/min (by C-G formula based on SCr of 0.93 mg/dL).   Medical History: Past Medical History:  Diagnosis Date  . Arthritis    "?right knee" (08/13/2017)  . Dysrhythmia    PAF  . Hernia, umbilical   . History of hiatal hernia   . History of kidney stones   . Stroke [facility]) 12/2009   "has to watch where he's walking since; more of a spacial thing resulting from stroke" (08/13/2017)    Medications:  No current facility-administered medications on file prior to encounter.    Current Outpatient Medications on File Prior to Encounter  Medication Sig Dispense Refill  . acetaminophen (TYLENOL) 325 MG tablet Take 2 tablets (650 mg total) by mouth every 6 (six) hours as needed for mild pain or headache.    Marland Kitchen aspirin 81 MG chewable tablet Chew 1 tablet (81 mg total) by mouth daily.    Marland Kitchen atorvastatin (LIPITOR) 80 MG tablet Take 1 tablet (80 mg total) by mouth daily at 6 PM. 90 tablet 3  . b complex vitamins tablet Take 1 tablet by mouth daily.    . cholecalciferol (VITAMIN D) 1000 units tablet Take 1,000 Units by mouth daily.    . clopidogrel (PLAVIX) 75 MG tablet Take 1 tablet (75 mg total) by mouth daily with breakfast. 90 tablet 3  . metoprolol tartrate (LOPRESSOR) 25 MG tablet Take 0.5 tablets (12.5 mg total) by mouth 2 (two) times  daily. 90 tablet 3  . nitroGLYCERIN (NITROSTAT) 0.4 MG SL tablet Place 0.4 mg under the tongue every 5 (five) minutes x 3 doses as needed for chest pain.    . Omega-3 Fatty Acids (FISH OIL PO) Take 1 capsule by mouth daily.    Marland Kitchen oxyCODONE (OXY IR/ROXICODONE) 5 MG immediate release tablet Take 1 tablet (5 mg total) by mouth every 6 (six) hours as needed for moderate pain. 15 tablet 0  . XARELTO 20 MG TABS tablet TAKE 1 TABLET BY MOUTH  DAILY WITH SUPPER 90 tablet 1     Assessment: 67 y.o. male with Afib/elevated cardiac markers for heparin.  Pt on Xarelto at home, last dose 5/24 in am.  Goal of Therapy:  Heparin level 0.3-0.7 units/ml Monitor platelets by anticoagulation protocol: Yes   Plan:  Heparin 4000 units IV bolus, then start heparin 1500 units/hr Check heparin level in 6 hours.   Caryl Pina 08/16/2017,12:00 AM

## 2017-08-15 NOTE — ED Notes (Addendum)
Pt BP is trending down into upper 64P systolic. MD made aware. Will continue to monitor vitals

## 2017-08-16 DIAGNOSIS — R748 Abnormal levels of other serum enzymes: Secondary | ICD-10-CM | POA: Diagnosis not present

## 2017-08-16 DIAGNOSIS — Z955 Presence of coronary angioplasty implant and graft: Secondary | ICD-10-CM

## 2017-08-16 DIAGNOSIS — I4891 Unspecified atrial fibrillation: Secondary | ICD-10-CM | POA: Diagnosis present

## 2017-08-16 DIAGNOSIS — I480 Paroxysmal atrial fibrillation: Secondary | ICD-10-CM | POA: Diagnosis not present

## 2017-08-16 LAB — CBC
HEMATOCRIT: 43.5 % (ref 39.0–52.0)
HEMATOCRIT: 44.1 % (ref 39.0–52.0)
HEMOGLOBIN: 15.3 g/dL (ref 13.0–17.0)
Hemoglobin: 14.8 g/dL (ref 13.0–17.0)
MCH: 31.3 pg (ref 26.0–34.0)
MCH: 32.3 pg (ref 26.0–34.0)
MCHC: 34 g/dL (ref 30.0–36.0)
MCHC: 34.7 g/dL (ref 30.0–36.0)
MCV: 92 fL (ref 78.0–100.0)
MCV: 93 fL (ref 78.0–100.0)
Platelets: 226 10*3/uL (ref 150–400)
Platelets: 240 10*3/uL (ref 150–400)
RBC: 4.73 MIL/uL (ref 4.22–5.81)
RBC: 4.74 MIL/uL (ref 4.22–5.81)
RDW: 12.5 % (ref 11.5–15.5)
RDW: 12.6 % (ref 11.5–15.5)
WBC: 10.1 10*3/uL (ref 4.0–10.5)
WBC: 9.3 10*3/uL (ref 4.0–10.5)

## 2017-08-16 LAB — BASIC METABOLIC PANEL
ANION GAP: 10 (ref 5–15)
BUN: 11 mg/dL (ref 6–20)
CALCIUM: 8.6 mg/dL — AB (ref 8.9–10.3)
CO2: 23 mmol/L (ref 22–32)
Chloride: 107 mmol/L (ref 101–111)
Creatinine, Ser: 0.9 mg/dL (ref 0.61–1.24)
GLUCOSE: 189 mg/dL — AB (ref 65–99)
POTASSIUM: 3.8 mmol/L (ref 3.5–5.1)
SODIUM: 140 mmol/L (ref 135–145)

## 2017-08-16 LAB — HIV ANTIBODY (ROUTINE TESTING W REFLEX): HIV Screen 4th Generation wRfx: NONREACTIVE

## 2017-08-16 LAB — TROPONIN I
TROPONIN I: 3.77 ng/mL — AB (ref <0.03)
Troponin I: 3.43 ng/mL (ref <0.03)

## 2017-08-16 LAB — HEPARIN LEVEL (UNFRACTIONATED): Heparin Unfractionated: 0.16 IU/mL — ABNORMAL LOW (ref 0.30–0.70)

## 2017-08-16 MED ORDER — HEPARIN BOLUS VIA INFUSION
4000.0000 [IU] | Freq: Once | INTRAVENOUS | Status: AC
  Administered 2017-08-16: 4000 [IU] via INTRAVENOUS
  Filled 2017-08-16: qty 4000

## 2017-08-16 MED ORDER — ATORVASTATIN CALCIUM 80 MG PO TABS
80.0000 mg | ORAL_TABLET | Freq: Every day | ORAL | Status: DC
  Filled 2017-08-16: qty 1

## 2017-08-16 MED ORDER — RIVAROXABAN 20 MG PO TABS
20.0000 mg | ORAL_TABLET | Freq: Every day | ORAL | Status: DC
  Filled 2017-08-16: qty 1

## 2017-08-16 MED ORDER — HEPARIN BOLUS VIA INFUSION
2500.0000 [IU] | Freq: Once | INTRAVENOUS | Status: AC
  Administered 2017-08-16: 2500 [IU] via INTRAVENOUS
  Filled 2017-08-16: qty 2500

## 2017-08-16 MED ORDER — AMIODARONE HCL 200 MG PO TABS: 400.0000 mg | ORAL_TABLET | Freq: Every day | ORAL | Status: DC

## 2017-08-16 MED ORDER — METOPROLOL TARTRATE 25 MG PO TABS
12.5000 mg | ORAL_TABLET | Freq: Two times a day (BID) | ORAL | Status: DC
  Administered 2017-08-16 (×2): 12.5 mg via ORAL
  Filled 2017-08-16 (×2): qty 1

## 2017-08-16 MED ORDER — AMIODARONE HCL 200 MG PO TABS: 400.0000 mg | ORAL_TABLET | Freq: Every day | ORAL | 1 refills | Status: DC

## 2017-08-16 MED ORDER — HEPARIN (PORCINE) IN NACL 100-0.45 UNIT/ML-% IJ SOLN
1300.0000 [IU]/h | INTRAMUSCULAR | Status: DC
  Administered 2017-08-16: 1000 [IU]/h via INTRAVENOUS
  Filled 2017-08-16: qty 250

## 2017-08-16 MED ORDER — ASPIRIN 81 MG PO CHEW
81.0000 mg | CHEWABLE_TABLET | Freq: Every day | ORAL | Status: DC
  Administered 2017-08-16: 81 mg via ORAL
  Filled 2017-08-16: qty 1

## 2017-08-16 MED ORDER — ACETAMINOPHEN 325 MG PO TABS: 650.0000 mg | ORAL_TABLET | Freq: Four times a day (QID) | ORAL | Status: DC | PRN

## 2017-08-16 MED ORDER — CLOPIDOGREL BISULFATE 75 MG PO TABS
75.0000 mg | ORAL_TABLET | Freq: Every day | ORAL | Status: DC
  Administered 2017-08-16: 75 mg via ORAL
  Filled 2017-08-16: qty 1

## 2017-08-16 MED ORDER — OXYCODONE HCL 5 MG PO TABS: 5.0000 mg | ORAL_TABLET | Freq: Four times a day (QID) | ORAL | Status: DC | PRN

## 2017-08-16 MED ORDER — NITROGLYCERIN 0.4 MG SL SUBL: 0.4000 mg | SUBLINGUAL_TABLET | SUBLINGUAL | Status: DC | PRN

## 2017-08-16 NOTE — ED Notes (Signed)
Paged cards/[place] to [place], [place]

## 2017-08-16 NOTE — ED Notes (Signed)
Admitting paged to RN regarding discharge per her request

## 2017-08-16 NOTE — Discharge Instructions (Signed)
Call if any further problems  Take amiodarone as instructed, most likely will decrease dose on follow up appt.  Continue plavix asa and xarelto.

## 2017-08-16 NOTE — ED Notes (Signed)
Paged [place] to [place], RN

## 2017-08-16 NOTE — ED Notes (Signed)
Admitting MD notified on elevated Troponin result.  

## 2017-08-16 NOTE — ED Notes (Signed)
Requested hospital bed from [facility] 

## 2017-08-16 NOTE — ED Notes (Signed)
HH ordered.

## 2017-08-16 NOTE — Progress Notes (Signed)
ANTICOAGULATION CONSULT NOTE - Follow Up Consult  Pharmacy Consult for Heparin Indication: afib with h/o CVA  Allergies  Allergen Reactions  . Feldene [Piroxicam] Hives    Patient Measurements: Height: 5\' 10"  (177.8 cm) Weight: 229 lb (103.9 kg) IBW/kg (Calculated) : 73 Heparin Dosing Weight:  93.5 kg  Vital Signs: BP: 121/78 (05/26 0935) Pulse Rate: 62 (05/26 0935)  Labs: Recent Labs    08/14/17 0242 08/15/17 2221 08/15/17 2252 08/15/17 2312 08/16/17 0303 08/16/17 0847  HGB 13.8  --  14.9  --  14.8 15.3  HCT 40.2  --  43.9  --  43.5 44.1  PLT 250  --  225  --  226 240  APTT  --   --   --  28  --   --   HEPARINUNFRC  --   --   --  0.35  --  0.16*  CREATININE 1.02 0.93  --   --   --   --   TROPONINI  --   --   --   --  3.77*  --     Estimated Creatinine Clearance: 94.4 mL/min (by C-G formula based on SCr of 0.93 mg/dL).   Assessment: CC/HPI:   Admitted with Afib for heparin from OSH. Cardioversion was attempted x2 at 100 J but was unsuccessful. Converted in ED to NSR.  PMH: CAD, AF, prior stroke 2011, recent PCI, umbilical hernia, h/o HH,   Significant events: hospitalized 5/23/-5/24 for PCI   Anticoag: Afib and h/o CVA (Xarelto PTA, last dose 5/24). CBC WNL. HL 0.16 this AM.  Goal of Therapy:  Heparin level 0.3-0.7 units/ml Monitor platelets by anticoagulation protocol: Yes   Plan:  Bolus heparin 2500 units and increase infusion to 1300 units/hr Recheck heparin leve in 8 hrs Daily HL and CBC  Stephen King, PharmD, BCPS Clinical Staff Pharmacist Pager [phone]  Stephen King 08/16/2017,9:49 AM

## 2017-08-16 NOTE — ED Notes (Signed)
Amiodarone drip d/c per verbal order from Dr. Rhae Hammock, Cardiology

## 2017-08-16 NOTE — ED Notes (Signed)
Assumed care on pt. , pt. sleeping with no distress/respirations unlabored , IV sites intact , Heparin drip infusing at 1000 units per hour , pt. denies chest pain or palpitations , no SOB .

## 2017-08-16 NOTE — ED Notes (Signed)
Pt stretcher switched out to a hospital bed

## 2017-08-16 NOTE — ED Notes (Signed)
Pt states he would like to ambulate and that he feels well enough to go home.  Cardiology paged.

## 2017-08-16 NOTE — Progress Notes (Signed)
DAILY PROGRESS NOTE   Patient Name: [name] Date of Encounter: 08/16/2017  Chief Complaint   Feels better today  Patient Profile   Stephen King is a 67 y.o. male with a history of CAD, AF, prior stroke and recent hospitalization for PCI who presents as transfer from OSH for [place].   Subjective   Converted back to sinus on amiodarone after 2 unsuccessful shocks at [facility]), while enroute by EMS. Feels great today. On IV heparin. Troponin flat elevated at 3.34-3.77 and 3.43. Suspect related to PCI - he is currently free of angina. EKG yesterday did not show ST elevation.  Objective   Vitals:   08/16/17 0930 08/16/17 0935 08/16/17 1000 08/16/17 1120  BP: 121/78 121/78 121/88 110/81  Pulse: 63 62 (!) 44 (!) 55  Resp: _0 SpO2: 93%  92% 95%  Weight:      Height:       No intake or output data in the 24 hours ending 08/16/17 1135 Filed Weights   08/15/17 2150  Weight: 229 lb (103.9 kg)    Physical Exam   General appearance: alert and no distress Lungs: clear to auscultation bilaterally Heart: regular rate and rhythm, S1, S2 normal, no murmur, click, rub or gallop Extremities: extremities normal, atraumatic, no cyanosis or edema Neurologic: Grossly normal  Inpatient Medications    Scheduled Meds: . aspirin  81 mg Oral Daily  . atorvastatin  80 mg Oral q1800  . clopidogrel  75 mg Oral Q breakfast  . metoprolol tartrate  12.5 mg Oral BID    Continuous Infusions: . heparin 1,300 Units/hr (08/16/17 1005)    PRN Meds: acetaminophen, nitroGLYCERIN, oxyCODONE   Labs   Results for orders placed or performed during the hospital encounter of 08/15/17 (from the past 48 hour(s))  Basic metabolic panel     Status: Abnormal   Collection Time: 08/15/17 10:21 PM  Result Value Ref Range   Sodium 137 135 - 145 mmol/L   Potassium 3.9 3.5 - 5.1 mmol/L   Chloride 108 101 - 111 mmol/L   CO2 20 (L) 22 - 32 mmol/L   Glucose, Bld 100 (H) 65  - 99 mg/dL   BUN 11 6 - 20 mg/dL   Creatinine, Ser 0.93 0.61 - 1.24 mg/dL   Calcium 8.2 (L) 8.9 - 10.3 mg/dL   GFR calc non Af Amer >60 >60 mL/min   GFR calc Af Amer >60 >60 mL/min    Comment: (NOTE) The eGFR has been calculated using the CKD EPI equation. This calculation has not been validated in all clinical situations. eGFR's persistently <60 mL/min signify possible Chronic Kidney Disease.    Anion gap 9 5 - 15    Comment: Performed at [facility] [address] [place], [place] [address]  I-Stat Troponin, ED (not at [facility])     Status: Abnormal   Collection Time: 08/15/17 10:27 PM  Result Value Ref Range   Troponin i, poc 3.34 (HH) 0.00 - 0.08 ng/mL   Comment NOTIFIED PHYSICIAN    Comment 3            Comment: Due to the release kinetics of cTnI, a negative result within the first hours of the onset of symptoms does not rule out myocardial infarction with certainty. If myocardial infarction is still suspected, repeat the test at appropriate intervals.   CBC with Differential     Status: Abnormal   Collection Time: 08/15/17  10:52 PM  Result Value Ref Range   WBC 11.4 (H) 4.0 - 10.5 K/uL   RBC 4.69 4.22 - 5.81 MIL/uL   Hemoglobin 14.9 13.0 - 17.0 g/dL   HCT 43.9 39.0 - 52.0 %   MCV 93.6 78.0 - 100.0 fL   MCH 31.8 26.0 - 34.0 pg   MCHC 33.9 30.0 - 36.0 g/dL   RDW 12.7 11.5 - 15.5 %   Platelets 225 150 - 400 K/uL   Neutrophils Relative % 73 %   Neutro Abs 8.3 (H) 1.7 - 7.7 K/uL   Lymphocytes Relative 17 %   Lymphs Abs 1.9 0.7 - 4.0 K/uL   Monocytes Relative 10 %   Monocytes Absolute 1.1 (H) 0.1 - 1.0 K/uL   Eosinophils Relative 0 %   Eosinophils Absolute 0.0 0.0 - 0.7 K/uL   Basophils Relative 0 %   Basophils Absolute 0.0 0.0 - 0.1 K/uL   Immature Granulocytes 0 %   Abs Immature Granulocytes 0.0 0.0 - 0.1 K/uL    Comment: Performed at [facility], [address], [place], [place] [address]  APTT     Status: None   Collection Time: 08/15/17 11:12 PM    Result Value Ref Range   aPTT 28 24 - 36 seconds    Comment: Performed at [facility] [address] [place], [place] [address]  Heparin level (unfractionated)     Status: None   Collection Time: 08/15/17 11:12 PM  Result Value Ref Range   Heparin Unfractionated 0.35 0.30 - 0.70 IU/mL    Comment: (NOTE) If heparin results are below expected values, and patient dosage has  been confirmed, suggest follow up testing of antithrombin III levels. Performed at [facility], [place] [address] [place] [address]   CBC     Status: None   Collection Time: 08/16/17  3:03 AM  Result Value Ref Range   WBC 10.1 4.0 - 10.5 K/uL   RBC 4.73 4.22 - 5.81 MIL/uL   Hemoglobin 14.8 13.0 - 17.0 g/dL   HCT 43.5 39.0 - 52.0 %   MCV 92.0 78.0 - 100.0 fL   MCH 31.3 26.0 - 34.0 pg   MCHC 34.0 30.0 - 36.0 g/dL   RDW 12.5 11.5 - 15.5 %   Platelets 226 150 - 400 K/uL    Comment: Performed at [facility], [place] [address] [place], [place] [address]  Troponin I     Status: Abnormal   Collection Time: 08/16/17  3:03 AM  Result Value Ref Range   Troponin I 3.77 (HH) <0.03 ng/mL    Comment: CRITICAL RESULT CALLED TO, READ BACK BY AND VERIFIED WITH: B.SANGALANG,RN 0426 08/16/17 M.CAMPBELL Performed at [facility], [place] [address], [place], [place] [address]   Heparin level (unfractionated)     Status: Abnormal   Collection Time: 08/16/17  8:47 AM  Result Value Ref Range   Heparin Unfractionated 0.16 (L) 0.30 - 0.70 IU/mL    Comment: (NOTE) If heparin results are below expected values, and patient dosage has  been confirmed, suggest follow up testing of antithrombin III levels. Performed at [facility], [place] [address] [place],  [address]   Troponin I     Status: Abnormal   Collection Time: 08/16/17  8:47 AM  Result Value Ref Range   Troponin I 3.43 (HH) <0.03 ng/mL    Comment: CRITICAL VALUE NOTED.  VALUE IS CONSISTENT WITH PREVIOUSLY REPORTED  AND CALLED  VALUE. Performed at [facility], [place] [address] [place], [place] [address]   Basic metabolic panel     Status: Abnormal   Collection Time: 08/16/17  8:47 AM  Result Value Ref Range   Sodium 140 135 - 145 mmol/L   Potassium 3.8 3.5 - 5.1 mmol/L   Chloride 107 101 - 111 mmol/L   CO2 23 22 - 32 mmol/L   Glucose, Bld 189 (H) 65 - 99 mg/dL   BUN 11 6 - 20 mg/dL   Creatinine, Ser 0.90 0.61 - 1.24 mg/dL   Calcium 8.6 (L) 8.9 - 10.3 mg/dL   GFR calc non Af Amer >60 >60 mL/min   GFR calc Af Amer >60 >60 mL/min    Comment: (NOTE) The eGFR has been calculated using the CKD EPI equation. This calculation has not been validated in all clinical situations. eGFR's persistently <60 mL/min signify possible Chronic Kidney Disease.    Anion gap 10 5 - 15    Comment: Performed at [facility] [address] [place] [address]  CBC     Status: None   Collection Time: 08/16/17  8:47 AM  Result Value Ref Range   WBC 9.3 4.0 - 10.5 K/uL   RBC 4.74 4.22 - 5.81 MIL/uL   Hemoglobin 15.3 13.0 - 17.0 g/dL   HCT 44.1 39.0 - 52.0 %   MCV 93.0 78.0 - 100.0 fL   MCH 32.3 26.0 - 34.0 pg   MCHC 34.7 30.0 - 36.0 g/dL   RDW 12.6 11.5 - 15.5 %   Platelets 240 150 - 400 K/uL    Comment: Performed at [facility], [place] [address] [place], [place] [address]    ECG   pending - Personally Reviewed  Telemetry   NSR - Personally Reviewed  Radiology    Dg Chest Portable 1 View  Result Date: 08/15/2017 CLINICAL DATA:  Chest pain EXAM: PORTABLE CHEST 1 VIEW COMPARISON:  08/21/2013 FINDINGS: There is no focal consolidation. There is mild hazy left basilar airspace disease likely reflecting atelectasis. There is no pleural effusion or pneumothorax. The heart and mediastinal contours are unremarkable. The osseous structures are unremarkable. IMPRESSION: Mild left basilar atelectasis. Otherwise no acute cardiopulmonary disease. Electronically Signed   By: Kathreen Devoid   On: 08/15/2017  22:22    Cardiac Studies   N/A  Assessment   1. Active Problems: 2.   Atrial fibrillation ([place]) 3.   Plan   1. PAF on Xarelto - now back in sinus after loading with amiodarone and 2 unsuccessful shocks. Will continue po amiodarone for now - Dr. Marlou Porch may want to switch him off this after follow-up next week. Troponin elevation flat, more likely related to recent PCI - chest pain free now and no ST elevation. Safe for d/c home today.   Time Spent Directly with Patient:  I have spent a total of 15 minutes with the patient reviewing hospital notes, telemetry, EKGs, labs and examining the patient as well as establishing an assessment and plan that was discussed personally with the patient. > 50% of time was spent in direct patient care.  Length of Stay:  LOS: 0 days   Pixie Casino, MD, [facility], [place] Director of the Advanced Lipid Disorders &  Cardiovascular Risk Reduction Clinic Diplomate of the American Board of Clinical Lipidology Attending Cardiologist  Direct Dial: [phone]  Fax: [phone]  Website:  [email or web address] [email or web address] 08/16/2017, 11:35 AM

## 2017-08-16 NOTE — Discharge Summary (Signed)
Discharge Summary    Patient ID: Stephen King,  MRN: [id number], DOB/AGE: 28-Aug-1950 67 y.o.  Admit date: 08/15/2017 Discharge date: 08/16/2017  Primary Care Provider: Donella Stade Primary Cardiologist: Candee Furbish, MD  Discharge Diagnoses    Principal Problem:   Atrial fibrillation [facility]) Active Problems:   History of CVA (cerebrovascular accident)   Chronic anticoagulation   CAD S/P percutaneous coronary angioplasty   Allergies Allergies  Allergen Reactions  . Feldene [Piroxicam] Hives    Diagnostic Studies/Procedures    None at [facility], he had 2 unsuccessful shocks at [facility] but did not convert.   _____________   History of Present Illness     67 yom with recent hx of angina and cath with PCI to LCX  08/13/17. He was discharged on the 24th on ASA, Plavix and Xaretlo .  He has hx of PAF.  He did have more freq episodes with his angina.  At discharge his BB was decreased to 12.5 mg BID for bradycardia.    On 08/15/17 pt presented to [facility] for AF RVR.  HR was in the 170s, he did have ST depression in ant leads wit inf TWI. He was hypotensive initially with systolic BP in 39J.  DCCV was attempted but unsuccessful X 2.   No chest pain, some nausea.  He was placed on amiodarone drip and transferred to [facility].      Hospital Course     Consultants: none   While in ER pt converted to SR.   Troponin was elevated at 3.34 to 3.77, flat.  He was placed on IV heparin.    Pt was seen by Dr. Debara Pickett and found stable for discharge. Was placed back on xarelto.  He is on amiodarone at 400 mg daily.  He has appt on 08/26/17 with cards for follow up.  He was instructed to call if any problems.   _____________  Discharge Vitals Blood pressure 114/86, pulse (!) 55, resp. rate 19, height 5\' 10"  (1.778 m), weight 229 lb (103.9 kg), SpO2 98 %.  Filed Weights   08/15/17 2150  Weight: 229 lb (103.9 kg)    Labs & Radiologic Studies    CBC Recent Labs     08/15/17 2252 08/16/17 0303 08/16/17 0847  WBC 11.4* 10.1 9.3  NEUTROABS 8.3*  --   --   HGB 14.9 14.8 15.3  HCT 43.9 43.5 44.1  MCV 93.6 92.0 93.0  PLT 225 226 673   Basic Metabolic Panel Recent Labs    08/15/17 2221 08/16/17 0847  NA 137 140  K 3.9 3.8  CL 108 107  CO2 20* 23  GLUCOSE 100* 189*  BUN 11 11  CREATININE 0.93 0.90  CALCIUM 8.2* 8.6*   Liver Function Tests No results for input(s): AST, ALT, ALKPHOS, BILITOT, PROT, ALBUMIN in the last 72 hours. No results for input(s): LIPASE, AMYLASE in the last 72 hours. Cardiac Enzymes Recent Labs    08/16/17 0303 08/16/17 0847  TROPONINI 3.77* 3.43*   BNP Invalid input(s): POCBNP D-Dimer No results for input(s): DDIMER in the last 72 hours. Hemoglobin A1C No results for input(s): HGBA1C in the last 72 hours. Fasting Lipid Panel No results for input(s): CHOL, HDL, LDLCALC, TRIG, CHOLHDL, LDLDIRECT in the last 72 hours. Thyroid Function Tests No results for input(s): TSH, T4TOTAL, T3FREE, THYROIDAB in the last 72 hours.  Invalid input(s): FREET3 _____________  Ct Coronary Morph W/cta Cor W/score W/ca W/cm &/or Wo/cm  Addendum Date: 08/07/2017   ADDENDUM REPORT: 08/07/2017 18:23 CLINICAL DATA:  67 year old male with chest pain. EXAM: Cardiac/Coronary  CT TECHNIQUE: The patient was scanned on a [employer]. FINDINGS: A 120 kV prospective scan was triggered in the descending thoracic aorta at 111 HU's. Axial non-contrast 3 mm slices were carried out through the heart. The data set was analyzed on a dedicated work station and scored using the [place]. Gantry rotation speed was 250 msecs and collimation was .6 mm. No beta blockade and 0.8 mg of sl NTG was given. The 3D data set was reconstructed in 5% intervals of the 67-82 % of the R-R cycle. Diastolic phases were analyzed on a dedicated work station using MPR, MIP and VRT modes. The patient received 80 cc of contrast. Aorta:  Normal size.  No  calcifications.  No dissection. Aortic Valve:  Trileaflet.  No calcifications. Coronary Arteries:  Normal coronary origin.  Right dominance. RCA is a medium size dominant artery that gives rise to PDA and PLVB. There is minimal calcified plaque in the proximal segment with associated stenosis 0-25%. Left main is a very short artery and LAD and LCX arteries originate almost directly from the left coronary sinus. LAD is a medium size vessel that gives rise to two small diagonal arteries. Proximal LAD has a mild calcified plaque with associated stenosis 25-50%. 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 LCX is a medium size non-dominant artery that gives rise to one large OM1 branch. Proximal LCX artery has long severe, predominantly non-calcified plaque with associated stenosis >70%. Mid and distal LCX artery and OM1 have only minimal plaque. Other findings: Normal pulmonary vein drainage into the left atrium. Normal let atrial appendage without a thrombus. Normal size of the pulmonary artery. IMPRESSION: 1. Coronary calcium score of 19. This was 51 percentile for age and sex matched control. 2. Normal coronary origin with right dominance. 3. Proximal LCX artery before the takeoff of a large OM1 branch has long severe, predominantly non-calcified plaque with associated stenosis >70%. Cardiac catheterization is recommended. 4.  Mildly dilated pulmonary artery measuring 32 mm. Electronically Signed   By: Ena Dawley   On: 08/07/2017 18:23   Result Date: 08/07/2017 EXAM: OVER-READ INTERPRETATION  CT CHEST The following report is an over-read performed by radiologist Dr. [name] of [facility], [place] on 08/07/2017. This over-read does not include interpretation of cardiac or coronary anatomy or pathology. The coronary calcium score/coronary CTA interpretation by the cardiologist is attached. COMPARISON:  None. FINDINGS: Aortic atherosclerosis. 6 mm right middle lobe nodule (axial image 29 of series 12),  stable dating back to 2004, considered definitively benign. Within the visualized portions of the thorax there are no other larger more suspicious appearing pulmonary nodules or masses, there is no acute consolidative airspace disease, no pleural effusions, no pneumothorax and no lymphadenopathy. Visualized portions of the upper abdomen demonstrates mild diffuse low attenuation throughout the visualized hepatic parenchyma, indicative of hepatic steatosis. There are no aggressive appearing lytic or blastic lesions noted in the visualized portions of the skeleton. IMPRESSION: 1.  Aortic Atherosclerosis (ICD10-I70.0). 2. Hepatic steatosis. Electronically Signed: By: [name] M.D. On: 08/07/2017 16:18   Dg Chest Portable 1 View  Result Date: 08/15/2017 CLINICAL DATA:  Chest pain EXAM: PORTABLE CHEST 1 VIEW COMPARISON:  08/21/2013 FINDINGS: There is no focal consolidation. There is mild hazy left basilar airspace disease likely reflecting atelectasis. There is no pleural effusion or pneumothorax. The heart and mediastinal contours are unremarkable. The  osseous structures are unremarkable. IMPRESSION: Mild left basilar atelectasis. Otherwise no acute cardiopulmonary disease. Electronically Signed   By: Kathreen Devoid   On: 08/15/2017 22:22   Ct Coronary Fractional Flow Reserve Data Prep  Result Date: 08/10/2017 EXAM: FF/RCT ANALYSIS FINDINGS: FFRct analysis was performed on the original cardiac CT angiogram dataset. Diagrammatic representation of the FFRct analysis is provided in a separate PDF document in PACS. This dictation was created using the PDF document and an interactive 3D model of the results. 3D model is not available in the EMR/PACS. Normal FFR range is >0.80. 1. Left Main:  No significant stenosis. 2. LAD: No significant stenosis. 3. LCX: Proximal CT FFR: 0.99.  Proximal to mid: 0.63. 4. RCA: No significant stenosis. IMPRESSION: 1. CT FFR analysis showed significant stenosis in the proximal to  mid LCX artery. Cardiac catheterization is recommended. Electronically Signed   By: Ena Dawley   On: 08/10/2017 09:15   Disposition   Pt is being discharged home today in good condition.  Follow-up Plans & Appointments  Call if any further problems  Take amiodarone as instructed, most likely will decrease dose on follow up appt.  Continue plavix asa and xarelto.   Follow-up Information    Burtis Junes, NP Follow up on 08/26/2017.   Specialties:  Nurse Practitioner, Interventional Cardiology, Cardiology, Radiology Why:  at 1130 AM Contact information: [place]. [address] [place] [address] [phone]            Discharge Medications   Allergies as of 08/16/2017      Reactions   Feldene [piroxicam] Hives      Medication List    TAKE these medications   acetaminophen 325 MG tablet Commonly known as:  TYLENOL Take 2 tablets (650 mg total) by mouth every 6 (six) hours as needed for mild pain or headache.   amiodarone 200 MG tablet Commonly known as:  PACERONE Take 2 tablets (400 mg total) by mouth daily.   aspirin 81 MG chewable tablet Chew 1 tablet (81 mg total) by mouth daily.   atorvastatin 80 MG tablet Commonly known as:  LIPITOR Take 1 tablet (80 mg total) by mouth daily at 6 PM.   b complex vitamins tablet Take 1 tablet by mouth daily.   cholecalciferol 1000 units tablet Commonly known as:  VITAMIN D Take 1,000 Units by mouth daily.   clopidogrel 75 MG tablet Commonly known as:  PLAVIX Take 1 tablet (75 mg total) by mouth daily with breakfast.   FISH OIL PO Take 1 capsule by mouth daily.   metoprolol tartrate 25 MG tablet Commonly known as:  LOPRESSOR Take 0.5 tablets (12.5 mg total) by mouth 2 (two) times daily.   nitroGLYCERIN 0.4 MG SL tablet Commonly known as:  NITROSTAT Place 0.4 mg under the tongue every 5 (five) minutes x 3 doses as needed for chest pain.   oxyCODONE 5 MG immediate release tablet Commonly known  as:  Oxy IR/ROXICODONE Take 1 tablet (5 mg total) by mouth every 6 (six) hours as needed for moderate pain.   XARELTO 20 MG Tabs tablet Generic drug:  rivaroxaban TAKE 1 TABLET BY MOUTH  DAILY WITH SUPPER          Outstanding Labs/Studies   BMP  Duration of Discharge Encounter   Greater than 30 minutes including physician time.  Signed, Huel Coventry, NP 08/16/2017, 12:38 PM

## 2017-08-17 ENCOUNTER — Encounter: Payer: Self-pay | Admitting: Cardiology

## 2017-08-17 DIAGNOSIS — R9431 Abnormal electrocardiogram [ECG] [EKG]: Secondary | ICD-10-CM | POA: Diagnosis not present

## 2017-08-17 DIAGNOSIS — I4891 Unspecified atrial fibrillation: Secondary | ICD-10-CM | POA: Diagnosis not present

## 2017-08-18 ENCOUNTER — Other Ambulatory Visit: Payer: Self-pay | Admitting: *Deleted

## 2017-08-18 ENCOUNTER — Ambulatory Visit: Payer: Medicare Other | Admitting: Cardiology

## 2017-08-18 ENCOUNTER — Telehealth: Payer: Self-pay

## 2017-08-18 ENCOUNTER — Encounter: Payer: Self-pay | Admitting: Cardiology

## 2017-08-18 VITALS — BP 120/86 | HR 65 | Ht 70.0 in | Wt 217.4 lb

## 2017-08-18 DIAGNOSIS — Z01812 Encounter for preprocedural laboratory examination: Secondary | ICD-10-CM

## 2017-08-18 DIAGNOSIS — R0683 Snoring: Secondary | ICD-10-CM

## 2017-08-18 DIAGNOSIS — R400 Somnolence: Secondary | ICD-10-CM | POA: Diagnosis not present

## 2017-08-18 DIAGNOSIS — I480 Paroxysmal atrial fibrillation: Secondary | ICD-10-CM

## 2017-08-18 DIAGNOSIS — I25118 Atherosclerotic heart disease of native coronary artery with other forms of angina pectoris: Secondary | ICD-10-CM | POA: Diagnosis not present

## 2017-08-18 NOTE — Patient Instructions (Addendum)
Medication Instructions:  Your physician recommends that you continue on your current medications as directed. Please refer to the Current Medication list given to you today.  Labwork: Pre procedure labs today: BMET & CBC  Testing/Procedures: Your physician has recommended that you have a sleep study. This test records several body functions during sleep, including: brain activity, eye movement, oxygen and carbon dioxide blood levels, heart rate and rhythm, breathing rate and rhythm, the flow of air through your mouth and nose, snoring, body muscle movements, and chest and belly movement.  Someone will contact you to arrange this test.  Your physician has requested that you have cardiac CT within 7 days PRIOR to your ablation. Cardiac computed tomography (CT) is a painless test that uses an x-ray machine to take clear, detailed pictures of your heart. For further information please visit [facility]. Please follow instructions below located under the special instructions section.  Your physician has recommended that you have an ablation. Catheter ablation is a medical procedure used to treat some cardiac arrhythmias (irregular heartbeats). During catheter ablation, a long, thin, flexible tube is put into a blood vessel in your groin (upper thigh), or neck. This tube is called an ablation catheter. It is then guided to your heart through the blood vessel. Radio frequency waves destroy small areas of heart tissue where abnormal heartbeats may cause an arrhythmia to start.   Instructions for your ablation: 1. Please arrive at the [facility], Main Entrance "A", of [facility] at 5:30 a.m. on 09/17/2017. 2. Do not eat or drink after midnight the night prior to the procedure. 3. Do not miss any doses of XARELTO prior to the morning of the procedure.  4. Do not take any medications the morning of the procedure. 5. You will shaved for this procedure (if needed). Typically both groins,  chest and back  are shaven. We ask that you shave these areas yourself at home 1-2 days prior  to the procedure. If you are uncomfortable/unable, then hospital staff will shave  these areas the morning of your procedure. 6. Plan for an overnight stay in the hospital. 7. You will need someone to drive you home at discharge.  Follow-Up: Your physician recommends that you schedule a follow-up appointment in: 4 weeks, after your procedure on 09/17/2017, with Roderic Palau NP in the AFib clinic.  Your physician recommends that you schedule a follow-up appointment in: 3 months, after your procedure on 09/17/2017, with Dr. Curt Bears.   * If you need a refill on your cardiac medications before your next appointment, please call your pharmacy.   *Please note that any paperwork needing to be filled out by the provider will need to be addressed at the front desk prior to seeing the provider. Please note that any FMLA, disability or other documents regarding health condition is subject to a $25.00 charge that must be received prior to completion of paperwork in the form of a money order or check.  Thank you for choosing [facility]!!   Trinidad Curet, RN [phone]  Any Other Special Instructions Will Be Listed Below (If Applicable).  CT INSTRUCTIONS Please arrive at the [facility] main entrance of [facility] at ______ AM (30-45 minutes prior to test start time)  [facility] [address], [place] [address] [phone]  Proceed to the [facility] Radiology Department (First Floor).  Please follow these instructions carefully (unless otherwise directed):  Hold all erectile dysfunction medications at least 48 hours  prior to test.  On the Night Before the Test: . Drink plenty of water. . Do not consume any caffeinated/decaffeinated beverages or chocolate 12 hours prior to your test. . Do not take any antihistamines 12 hours prior to your test. . If you take  Metformin do not take 24 hours prior to test. . If the patient has contrast allergy: ? Patient will need a prescription for Prednisone and very clear instructions (as follows): 1. Prednisone 50 mg - take 13 hours prior to test 2. Take another Prednisone 50 mg 7 hours prior to test 3. Take another Prednisone 50 mg 1 hour prior to test 4. Take Benadryl 50 mg 1 hour prior to test . Patient must complete all four doses of above prophylactic medications. . Patient will need a ride after test due to Benadryl.  On the Day of the Test: . Drink plenty of water. Do not drink any water within one hour of the test. . Do not eat any food 4 hours prior to the test. . You may take your regular medications prior to the test. . MAKE SURE TO TAKE YOUR LOPRESSOR (METOPROLOL) THE MORNING OF THIS TEST. Marland Kitchen HOLD Furosemide morning of the test.  After the Test: . Drink plenty of water. . After receiving IV contrast, you may experience a mild flushed feeling. This is normal. . On occasion, you may experience a mild rash up to 24 hours after the test. This is not dangerous. If this occurs, you can take Benadryl 25 mg and increase your fluid intake. . If you experience trouble breathing, this can be serious. If it is severe call 911 IMMEDIATELY. If it is mild, please call our office. . If you take any of these medications: Glipizide/Metformin, Avandament, Glucavance, please do not take 48 hours after completing test.    Cardiac Ablation Cardiac ablation is a procedure to disable (ablate) a small amount of heart tissue in very specific places. The heart has many electrical connections. Sometimes these connections are abnormal and can cause the heart to beat very fast or irregularly. Ablating some of the problem areas can improve the heart rhythm or return it to normal. Ablation may be done for people who:  Have Wolff-Parkinson-White syndrome.  Have fast heart rhythms (tachycardia).  Have taken medicines for an  abnormal heart rhythm (arrhythmia) that were not effective or caused side effects.  Have a high-risk heartbeat that may be life-threatening.  During the procedure, a small incision is made in the neck or the groin, and a long, thin, flexible tube (catheter) is inserted into the incision and moved to the heart. Small devices (electrodes) on the tip of the catheter will send out electrical currents. A type of X-ray (fluoroscopy) will be used to help guide the catheter and to provide images of the heart. Tell a health care provider about:  Any allergies you have.  All medicines you are taking, including vitamins, herbs, eye drops, creams, and over-the-counter medicines.  Any problems you or family members have had with anesthetic medicines.  Any blood disorders you have.  Any surgeries you have had.  Any medical conditions you have, such as kidney failure.  Whether you are pregnant or may be pregnant. What are the risks? Generally, this is a safe procedure. However, problems may occur, including:  Infection.  Bruising and bleeding at the catheter insertion site.  Bleeding into the chest, especially into the sac that surrounds the heart. This is a serious complication.  Stroke or blood  clots.  Damage to other structures or organs.  Allergic reaction to medicines or dyes.  Need for a permanent pacemaker if the normal electrical system is damaged. A pacemaker is a small computer that sends electrical signals to the heart and helps your heart beat normally.  The procedure not being fully effective. This may not be recognized until months later. Repeat ablation procedures are sometimes required.  What happens before the procedure?  Follow instructions from your health care provider about eating or drinking restrictions.  Ask your health care provider about: ? Changing or stopping your regular medicines. This is especially important if you are taking diabetes medicines or blood  thinners. ? Taking medicines such as aspirin and ibuprofen. These medicines can thin your blood. Do not take these medicines before your procedure if your health care provider instructs you not to.  Plan to have someone take you home from the hospital or clinic.  If you will be going home right after the procedure, plan to have someone with you for 24 hours. What happens during the procedure?  To lower your risk of infection: ? Your health care team will wash or sanitize their hands. ? Your skin will be washed with soap. ? Hair may be removed from the incision area.  An IV tube will be inserted into one of your veins.  You will be given a medicine to help you relax (sedative).  The skin on your neck or groin will be numbed.  An incision will be made in your neck or your groin.  A needle will be inserted through the incision and into a large vein in your neck or groin.  A catheter will be inserted into the needle and moved to your heart.  Dye may be injected through the catheter to help your surgeon see the area of the heart that needs treatment.  Electrical currents will be sent from the catheter to ablate heart tissue in desired areas. There are three types of energy that may be used to ablate heart tissue: ? Heat (radiofrequency energy). ? Laser energy. ? Extreme cold (cryoablation).  When the necessary tissue has been ablated, the catheter will be removed.  Pressure will be held on the catheter insertion area to prevent excessive bleeding.  A bandage (dressing) will be placed over the catheter insertion area. The procedure may vary among health care providers and hospitals. What happens after the procedure?  Your blood pressure, heart rate, breathing rate, and blood oxygen level will be monitored until the medicines you were given have worn off.  Your catheter insertion area will be monitored for bleeding. You will need to lie still for a few hours to ensure that you do  not bleed from the catheter insertion area.  Do not drive for 24 hours or as long as directed by your health care provider. Summary  Cardiac ablation is a procedure to disable (ablate) a small amount of heart tissue in very specific places. Ablating some of the problem areas can improve the heart rhythm or return it to normal.  During the procedure, electrical currents will be sent from the catheter to ablate heart tissue in desired areas. This information is not intended to replace advice given to you by your health care provider. Make sure you discuss any questions you have with your health care provider. Document Released: 07/27/2008 Document Revised: 01/28/2016 Document Reviewed: 01/28/2016 Elsevier Interactive Patient Education  [employer].

## 2017-08-18 NOTE — Telephone Encounter (Signed)
Patient contacted regarding discharge from [facility] on 08/16/2017.  Patient understands to follow up with Dr Curt Bears on today (08/18/17) at 2:00 at [facility] in [place].. Patient understands discharge instructions? Yes Patient understands medications and regiment? Yes Patient understands to bring all medications to this visit? Yes  The pt sent [place] the following [facility] message:  Need to reschedule my next appointment to a day and time this week. Ambulance carried me to ER on Saturday 5/25 due to Afib. >180BPM. Ambulance drivers used the difibrulator twice. It took 8 hours to stabilize my heart rate. Stent was placed through my arm at [facility] on Thursday 5/23.   Appointment rescheduled to today.

## 2017-08-18 NOTE — Telephone Encounter (Signed)
-----   Message from Stephen King sent at 08/14/2017  2:01 PM EDT ----- Pt has a TOC appt on 08/25/17 @ 10:00 with Servando Snare per Rosalyn Gess

## 2017-08-18 NOTE — Progress Notes (Signed)
Electrophysiology Office Note   Date:  08/18/2017   ID:  ALDRIDGE KRZYZANOWSKI, DOB 1950/04/16, MRN [id number]  PCP:  Donella Stade PA-C  Cardiologist:  Marlou Porch Primary Electrophysiologist:  Milind Raether Meredith Leeds, MD    Chief Complaint  Patient presents with  . Advice Only    PAF     History of Present Illness: Stephen King is a 67 y.o. male who is being seen today for the evaluation of atrial fibrillation at the request of mark Skains. Presenting today for electrophysiology evaluation.  He has a history of stroke in 2011, paroxysmal atrial fibrillation on Xarelto.  He also has a history of coronary artery disease status post circumflex stent 08/14/2017.  Is currently on aspirin, Plavix, Xarelto.  He has had more frequent episodes of atrial fibrillation with episodes October 2018, April 2019, and May 2019.  On 08/15/2017, he presented to [facility] for A. fib with RVR.  Heart rate was in the 170s at this time.  Cardioversion was attempted but was unsuccessful x2.  He was placed on amiodarone and transferred to [place], he was started on amiodarone 400 mg daily.    Today, he denies symptoms of palpitations, chest pain, shortness of breath, orthopnea, PND, lower extremity edema, claudication, dizziness, presyncope, syncope, bleeding, or neurologic sequela. The patient is tolerating medications without difficulties.  He is noted some chest pain since his catheterization.  This is been somewhat well controlled with oxycodone.  He was told that the chest pain was due to jailing a small marginal branch.  The branch was too small for ballooning or stenting.  Of note, the patient does say that he snores and his wife says that he holds his breath when he sleeps.   Past Medical History:  Diagnosis Date  . Arthritis    "?right knee" (08/13/2017)  . Dysrhythmia    PAF  . Hernia, umbilical   . History of hiatal hernia   . History of kidney stones   . Stroke [facility])  12/2009   "has to watch where he's walking since; more of a spacial thing resulting from stroke" (08/13/2017)   Past Surgical History:  Procedure Laterality Date  . ABDOMINAL HERNIA REPAIR     "has a mesh in his abdomen"  . ANTERIOR CERVICAL DECOMP/DISCECTOMY FUSION  10/2005   C6-7  . BACK SURGERY    . CARDIAC CATHETERIZATION  2003  . CATARACT EXTRACTION W/ INTRAOCULAR LENS  IMPLANT, BILATERAL Bilateral   . CORONARY ANGIOPLASTY WITH STENT PLACEMENT  08/13/2017  . CORONARY STENT INTERVENTION N/A 08/13/2017   Procedure: CORONARY STENT INTERVENTION;  Surgeon: Burnell Blanks, MD;  Location: [place] [facility];  Service: Cardiovascular;  Laterality: N/A;  . EXCISIONAL HEMORRHOIDECTOMY    . HERNIA REPAIR    . LEFT HEART CATH AND CORONARY ANGIOGRAPHY N/A 08/13/2017   Procedure: LEFT HEART CATH AND CORONARY ANGIOGRAPHY;  Surgeon: Burnell Blanks, MD;  Location: [place] [facility];  Service: Cardiovascular;  Laterality: N/A;  . SHOULDER ARTHROSCOPY WITH ROTATOR CUFF REPAIR Right 10/30/2014   Procedure: SHOULDER ARTHROSCOPY WITH ROTATOR CUFF REPAIR;  Surgeon: Tania Ade, MD;  Location: [place];  Service: Orthopedics;  Laterality: Right;  . SHOULDER ARTHROSCOPY WITH SUBACROMIAL DECOMPRESSION Right 10/30/2014   Procedure: Right shoulder arthroscopy rotator cuff repair, subacromial decompression;  Surgeon: Tania Ade, MD;  Location: [place];  Service: Orthopedics;  Laterality: Right;  Right shoulder arthrscopy rotator cuff repair, subacromial  decompression     Current Outpatient Medications  Medication Sig Dispense Refill  . acetaminophen (TYLENOL) 325 MG tablet Take 2 tablets (650 mg total) by mouth every 6 (six) hours as needed for mild pain or headache.    Marland Kitchen amiodarone (PACERONE) 200 MG tablet Take 2 tablets (400 mg total) by mouth daily. 60 tablet 1  . aspirin 81 MG chewable tablet Chew 1 tablet (81 mg total) by mouth daily.    Marland Kitchen  atorvastatin (LIPITOR) 80 MG tablet Take 1 tablet (80 mg total) by mouth daily at 6 PM. 90 tablet 3  . b complex vitamins tablet Take 1 tablet by mouth daily.    . cholecalciferol (VITAMIN D) 1000 units tablet Take 1,000 Units by mouth daily.    . clopidogrel (PLAVIX) 75 MG tablet Take 1 tablet (75 mg total) by mouth daily with breakfast. 90 tablet 3  . metoprolol tartrate (LOPRESSOR) 25 MG tablet Take 0.5 tablets (12.5 mg total) by mouth 2 (two) times daily. 90 tablet 3  . nitroGLYCERIN (NITROSTAT) 0.4 MG SL tablet Place 0.4 mg under the tongue every 5 (five) minutes x 3 doses as needed for chest pain.    . Omega-3 Fatty Acids (FISH OIL PO) Take 1 capsule by mouth daily.    Marland Kitchen oxyCODONE (OXY IR/ROXICODONE) 5 MG immediate release tablet Take 1 tablet (5 mg total) by mouth every 6 (six) hours as needed for moderate pain. 15 tablet 0  . XARELTO 20 MG TABS tablet TAKE 1 TABLET BY MOUTH  DAILY WITH SUPPER 90 tablet 1   No current facility-administered medications for this visit.     Allergies:   Feldene [piroxicam]   Social History:  The patient  reports that he quit smoking about 18 years ago. His smoking use included cigarettes. He quit after 27.00 years of use. He has never used smokeless tobacco. He reports that he does not drink alcohol or use drugs.   Family History:  The patient's family history includes Alcohol abuse in his father and mother; Arrhythmia in his mother; Colon cancer in his father; Stroke in his maternal grandfather.    ROS:  Please see the history of present illness.   Otherwise, review of systems is positive for sweats, fatigue, chills, chest pain, palpitations, shortness of breath, visual changes, cough, snoring, nausea, diarrhea, dizziness, headaches, bleeding.   All other systems are reviewed and negative.    PHYSICAL EXAM: VS:  BP 120/86   Pulse 65   Ht 5\' 10"  (1.778 m)   Wt 217 lb 6.4 oz (98.6 kg)   SpO2 97%   BMI 31.19 kg/m  , BMI Body mass index is 31.19  kg/m. GEN: Well nourished, well developed, in no acute distress  HEENT: normal  Neck: no JVD, carotid bruits, or masses Cardiac: RRR; no murmurs, rubs, or gallops,no edema  Respiratory:  clear to auscultation bilaterally, normal work of breathing GI: soft, nontender, nondistended, + BS MS: no deformity or atrophy  Skin: warm and dry Neuro:  Strength and sensation are intact Psych: euthymic mood, full affect  EKG:  EKG is ordered today. Personal review of the ekg ordered shows SR, rate 65  Recent Labs: 08/16/2017: BUN 11; Creatinine, Ser 0.90; Hemoglobin 15.3; Platelets 240; Potassium 3.8; Sodium 140    Lipid Panel     Component Value Date/Time   CHOL 151 11/02/2015 0804   TRIG 42 11/02/2015 0804   HDL 53 11/02/2015 0804   CHOLHDL 2.8 11/02/2015 0804   VLDL 8  11/02/2015 0804   LDLCALC 90 11/02/2015 0804     Wt Readings from Last 3 Encounters:  08/18/17 217 lb 6.4 oz (98.6 kg)  08/15/17 229 lb (103.9 kg)  08/14/17 229 lb 15 oz (104.3 kg)      Other studies Reviewed: Additional studies/ records that were reviewed today include: LHC 08/13/17  Review of the above records today demonstrates:   Prox RCA lesion is 20% stenosed.  Mid RCA lesion is 30% stenosed.  Prox Cx lesion is 80% stenosed.  A drug-eluting stent was successfully placed using a STENT RESOLUTE ONYX 4.0X18.  Post intervention, there is a 0% residual stenosis.  The left ventricular systolic function is normal.  LV end diastolic pressure is normal.  The left ventricular ejection fraction is greater than 65% by visual estimate.  There is no mitral valve regurgitation.   1. Severe stenosis proximal Circumflex artery 2. Successful PTCA/DES x 1 proximal Circumflex but unfortunately a very small caliber obtuse marginal branch was jailed by the stent and flow was lost down this branch. The branch was 0.5-0.75 mm and too small for a balloon or stent. He had severe chest pain post stent deployment felt to be  due to the loss of flow down this small branch.  3. Mild non-obstructive plaque in the RCA 4. Normal LV systolic function  TTE 5284 - Left ventricle: The cavity size was normal. There was mild concentric hypertrophy. Systolic function was normal. The estimated ejection fraction was in the range of 60% to 65%. Wall motion was normal; there were no regional wall motion abnormalities. - Mitral valve: There was systolic anterior motion. There was mild regurgitation. - Right ventricle: The cavity size was mildly dilated. Wall thickness was normal. - Right atrium: The atrium was mildly dilated.  ASSESSMENT AND PLAN:  1.  Paroxysmal atrial fibrillation: Currently on amiodarone and Xarelto.  Gust with him changing medications to Multaq or dofetilide versus ablation.  Risks and benefits of ablation were discussed and include bleeding, tamponade, heart block, stroke, damage to surrounding organs.  The patient understands these risks and is agreed to the procedure.  Of note he is on triple therapy now with his recent stent.  Lynsay Fesperman wait for 1 month prior to scheduling his procedure.    This patients CHA2DS2-VASc Score and unadjusted Ischemic Stroke Rate (% per year) is equal to 2.2 % stroke rate/year from a score of 2  Above score calculated as 1 point each if present [CHF, HTN, DM, Vascular=MI/PAD/Aortic Plaque, Age if 65-74, or Male] Above score calculated as 2 points each if present [Age > 75, or Stroke/TIA/TE]  2.  Coronary artery disease: Status post circumflex stent 08/15/2017.  On aspirin and Plavix.  He Illianna Paschal need 1 month on triple therapy and then Nikia Mangino be able to come off of the aspirin.  Having chest pain ongoing due to a jailed OM branch.  Continue his oxycodone.  3.  Obesity: Weight loss encouraged.  He does snore and his wife says that he holds his breath when he sleeps.  We Shelbi Vaccaro plan for outpatient sleep study.  Current medicines are reviewed at length with the patient  today.   The patient does not have concerns regarding his medicines.  The following changes were made today:  none  Labs/ tests ordered today include:  Orders Placed This Encounter  Procedures  . CT CARDIAC MORPH/PULM VEIN W/CM&W/O CA SCORE  . CT CORONARY FRACTIONAL FLOW RESERVE DATA PREP  . CT CORONARY FRACTIONAL FLOW RESERVE  FLUID ANALYSIS  . Basic Metabolic Panel (BMET)  . CBC w/Diff  . EKG 12-Lead   Case discussed with primary cardiology  Disposition:   FU with Konnor Jorden 3 months  Signed, Nataliee Shurtz Meredith Leeds, MD  08/18/2017 2:57 PM     [place] [place] [address] [phone] (office) [phone] (fax)

## 2017-08-19 LAB — BASIC METABOLIC PANEL
BUN/Creatinine Ratio: 10 (ref 10–24)
BUN: 11 mg/dL (ref 8–27)
CALCIUM: 9.2 mg/dL (ref 8.6–10.2)
CO2: 22 mmol/L (ref 20–29)
CREATININE: 1.1 mg/dL (ref 0.76–1.27)
Chloride: 102 mmol/L (ref 96–106)
GFR calc Af Amer: 80 mL/min/{1.73_m2} (ref >59)
GFR, EST NON AFRICAN AMERICAN: 70 mL/min/{1.73_m2} (ref >59)
Glucose: 86 mg/dL (ref 65–99)
Potassium: 4.5 mmol/L (ref 3.5–5.2)
SODIUM: 141 mmol/L (ref 134–144)

## 2017-08-19 LAB — CBC WITH DIFFERENTIAL/PLATELET
BASOS: 0 %
Basophils Absolute: 0 10*3/uL (ref 0.0–0.2)
EOS (ABSOLUTE): 0.2 10*3/uL (ref 0.0–0.4)
EOS: 2 %
HEMATOCRIT: 44.6 % (ref 37.5–51.0)
HEMOGLOBIN: 15.4 g/dL (ref 13.0–17.7)
Immature Grans (Abs): 0 10*3/uL (ref 0.0–0.1)
Immature Granulocytes: 0 %
LYMPHS ABS: 2.4 10*3/uL (ref 0.7–3.1)
Lymphs: 29 %
MCH: 32.4 pg (ref 26.6–33.0)
MCHC: 34.5 g/dL (ref 31.5–35.7)
MCV: 94 fL (ref 79–97)
MONOCYTES: 12 %
Monocytes Absolute: 1 10*3/uL — ABNORMAL HIGH (ref 0.1–0.9)
Neutrophils Absolute: 4.6 10*3/uL (ref 1.4–7.0)
Neutrophils: 57 %
Platelets: 315 10*3/uL (ref 150–450)
RBC: 4.76 x10E6/uL (ref 4.14–5.80)
RDW: 13.5 % (ref 12.3–15.4)
WBC: 8.1 10*3/uL (ref 3.4–10.8)

## 2017-08-21 ENCOUNTER — Telehealth (HOSPITAL_COMMUNITY): Payer: Self-pay

## 2017-08-21 NOTE — Telephone Encounter (Signed)
Patients insurance is active and benefits verified through [facility] - $20.00 co-pay, no deductible, out of pocket amount of $4,400/$485.24 has been met, no co-insurance and no pre-authorization is required. Passport/reference [phone]  Will contact patient to see if he is interested in the Cardiac Rehab Program. If interested, patient will be scheduled upon review by the RN Navigator.

## 2017-08-24 ENCOUNTER — Telehealth (HOSPITAL_COMMUNITY): Payer: Self-pay

## 2017-08-24 ENCOUNTER — Telehealth: Payer: Self-pay | Admitting: *Deleted

## 2017-08-24 NOTE — Telephone Encounter (Signed)
Called patient to see if he is interested in the Cardiac Rehab Program - Patient stated he is interested. He has Abblasion scheduled on 09/17/2017. Will continue to follow.

## 2017-08-24 NOTE — Telephone Encounter (Signed)
Patient notified of in lab sleep study appointment scheduled for 09/15/17 @ [facility].

## 2017-08-25 ENCOUNTER — Ambulatory Visit: Payer: Medicare Other | Admitting: Nurse Practitioner

## 2017-08-26 ENCOUNTER — Ambulatory Visit: Payer: Medicare Other | Admitting: Nurse Practitioner

## 2017-09-14 ENCOUNTER — Ambulatory Visit (HOSPITAL_COMMUNITY)
Admission: RE | Admit: 2017-09-14 | Discharge: 2017-09-14 | Disposition: A | Discharge status: Home / Self Care | Payer: Medicare Other | Source: Ambulatory Visit | Attending: Cardiology | Admitting: Cardiology

## 2017-09-14 ENCOUNTER — Ambulatory Visit (HOSPITAL_COMMUNITY): Payer: Medicare Other

## 2017-09-14 DIAGNOSIS — I7789 Other specified disorders of arteries and arterioles: Secondary | ICD-10-CM | POA: Diagnosis not present

## 2017-09-14 DIAGNOSIS — I700 Atherosclerosis of aorta: Secondary | ICD-10-CM | POA: Insufficient documentation

## 2017-09-14 DIAGNOSIS — I480 Paroxysmal atrial fibrillation: Secondary | ICD-10-CM | POA: Diagnosis not present

## 2017-09-14 DIAGNOSIS — R911 Solitary pulmonary nodule: Secondary | ICD-10-CM | POA: Insufficient documentation

## 2017-09-14 DIAGNOSIS — I517 Cardiomegaly: Secondary | ICD-10-CM | POA: Diagnosis not present

## 2017-09-14 DIAGNOSIS — I4891 Unspecified atrial fibrillation: Secondary | ICD-10-CM | POA: Diagnosis not present

## 2017-09-14 MED ORDER — IOPAMIDOL (ISOVUE-370) INJECTION 76%
INTRAVENOUS | Status: AC
  Administered 2017-09-14: 80 mL
  Filled 2017-09-14: qty 100

## 2017-09-15 ENCOUNTER — Encounter (HOSPITAL_BASED_OUTPATIENT_CLINIC_OR_DEPARTMENT_OTHER): Payer: Medicare Other

## 2017-09-17 ENCOUNTER — Ambulatory Visit (HOSPITAL_COMMUNITY)
Admission: RE | Disposition: A | Discharge status: Home / Self Care | Payer: Self-pay | Source: Ambulatory Visit | Attending: Cardiology

## 2017-09-17 ENCOUNTER — Ambulatory Visit (HOSPITAL_COMMUNITY)
Admission: RE | Admit: 2017-09-17 | Discharge: 2017-09-18 | Disposition: A | Discharge status: Home / Self Care | Payer: Medicare Other | Source: Ambulatory Visit | Attending: Cardiology | Admitting: Cardiology

## 2017-09-17 ENCOUNTER — Encounter (HOSPITAL_COMMUNITY): Payer: Self-pay | Admitting: Cardiology

## 2017-09-17 ENCOUNTER — Ambulatory Visit (HOSPITAL_COMMUNITY): Payer: Medicare Other | Admitting: Anesthesiology

## 2017-09-17 DIAGNOSIS — I2510 Atherosclerotic heart disease of native coronary artery without angina pectoris: Secondary | ICD-10-CM | POA: Diagnosis not present

## 2017-09-17 DIAGNOSIS — I4891 Unspecified atrial fibrillation: Secondary | ICD-10-CM | POA: Diagnosis not present

## 2017-09-17 DIAGNOSIS — Z6831 Body mass index (BMI) 31.0-31.9, adult: Secondary | ICD-10-CM | POA: Diagnosis not present

## 2017-09-17 DIAGNOSIS — Z9841 Cataract extraction status, right eye: Secondary | ICD-10-CM | POA: Insufficient documentation

## 2017-09-17 DIAGNOSIS — Z955 Presence of coronary angioplasty implant and graft: Secondary | ICD-10-CM | POA: Insufficient documentation

## 2017-09-17 DIAGNOSIS — I499 Cardiac arrhythmia, unspecified: Secondary | ICD-10-CM | POA: Insufficient documentation

## 2017-09-17 DIAGNOSIS — I480 Paroxysmal atrial fibrillation: Secondary | ICD-10-CM | POA: Diagnosis not present

## 2017-09-17 DIAGNOSIS — Z823 Family history of stroke: Secondary | ICD-10-CM | POA: Diagnosis not present

## 2017-09-17 DIAGNOSIS — Z9889 Other specified postprocedural states: Secondary | ICD-10-CM | POA: Insufficient documentation

## 2017-09-17 DIAGNOSIS — Z87442 Personal history of urinary calculi: Secondary | ICD-10-CM | POA: Diagnosis not present

## 2017-09-17 DIAGNOSIS — Z9842 Cataract extraction status, left eye: Secondary | ICD-10-CM | POA: Diagnosis not present

## 2017-09-17 DIAGNOSIS — M1711 Unilateral primary osteoarthritis, right knee: Secondary | ICD-10-CM | POA: Diagnosis not present

## 2017-09-17 DIAGNOSIS — E669 Obesity, unspecified: Secondary | ICD-10-CM | POA: Insufficient documentation

## 2017-09-17 DIAGNOSIS — Z981 Arthrodesis status: Secondary | ICD-10-CM | POA: Diagnosis not present

## 2017-09-17 DIAGNOSIS — Z8673 Personal history of transient ischemic attack (TIA), and cerebral infarction without residual deficits: Secondary | ICD-10-CM | POA: Diagnosis not present

## 2017-09-17 DIAGNOSIS — Z87891 Personal history of nicotine dependence: Secondary | ICD-10-CM | POA: Insufficient documentation

## 2017-09-17 DIAGNOSIS — Z7982 Long term (current) use of aspirin: Secondary | ICD-10-CM | POA: Insufficient documentation

## 2017-09-17 DIAGNOSIS — Z79899 Other long term (current) drug therapy: Secondary | ICD-10-CM | POA: Insufficient documentation

## 2017-09-17 DIAGNOSIS — Z8249 Family history of ischemic heart disease and other diseases of the circulatory system: Secondary | ICD-10-CM | POA: Insufficient documentation

## 2017-09-17 DIAGNOSIS — Z7901 Long term (current) use of anticoagulants: Secondary | ICD-10-CM | POA: Insufficient documentation

## 2017-09-17 HISTORY — PX: ATRIAL FIBRILLATION ABLATION: EP1191

## 2017-09-17 LAB — POCT ACTIVATED CLOTTING TIME
ACTIVATED CLOTTING TIME: 164 s
Activated Clotting Time: 301 seconds
Activated Clotting Time: 329 seconds
Activated Clotting Time: 334 seconds

## 2017-09-17 SURGERY — ATRIAL FIBRILLATION ABLATION
Anesthesia: Monitor Anesthesia Care

## 2017-09-17 MED ORDER — RIVAROXABAN 20 MG PO TABS
20.0000 mg | ORAL_TABLET | Freq: Every day | ORAL | Status: DC
  Administered 2017-09-17 – 2017-09-18 (×2): 20 mg via ORAL
  Filled 2017-09-17 (×2): qty 1

## 2017-09-17 MED ORDER — HEPARIN SODIUM (PORCINE) 1000 UNIT/ML IJ SOLN
INTRAMUSCULAR | Status: AC
  Filled 2017-09-17: qty 1

## 2017-09-17 MED ORDER — DEXAMETHASONE SODIUM PHOSPHATE 10 MG/ML IJ SOLN
INTRAMUSCULAR | Status: DC | PRN
  Administered 2017-09-17: 10 mg via INTRAVENOUS

## 2017-09-17 MED ORDER — PHENYLEPHRINE HCL 10 MG/ML IJ SOLN
INTRAVENOUS | Status: DC | PRN
  Administered 2017-09-17: 50 ug/min via INTRAVENOUS

## 2017-09-17 MED ORDER — HEPARIN SODIUM (PORCINE) 1000 UNIT/ML IJ SOLN
INTRAMUSCULAR | Status: DC | PRN
  Administered 2017-09-17: 1000 [IU] via INTRAVENOUS

## 2017-09-17 MED ORDER — VITAMIN D 1000 UNITS PO TABS
1000.0000 [IU] | ORAL_TABLET | Freq: Every day | ORAL | Status: DC
  Filled 2017-09-17: qty 1

## 2017-09-17 MED ORDER — HEPARIN SODIUM (PORCINE) 1000 UNIT/ML IJ SOLN
INTRAMUSCULAR | Status: DC | PRN
  Administered 2017-09-17: 15000 [IU] via INTRAVENOUS
  Administered 2017-09-17: 3000 [IU] via INTRAVENOUS
  Administered 2017-09-17: 1000 [IU] via INTRAVENOUS

## 2017-09-17 MED ORDER — HEPARIN (PORCINE) IN NACL 1000-0.9 UT/500ML-% IV SOLN
INTRAVENOUS | Status: AC
  Filled 2017-09-17: qty 500

## 2017-09-17 MED ORDER — DOBUTAMINE-DEXTROSE 2-5 MG/ML-% IV SOLN
INTRAVENOUS | Status: DC | PRN
  Administered 2017-09-17: 20 ug/kg/min via INTRAVENOUS

## 2017-09-17 MED ORDER — ONDANSETRON HCL 4 MG/2ML IJ SOLN
INTRAMUSCULAR | Status: DC | PRN
  Administered 2017-09-17: 4 mg via INTRAVENOUS

## 2017-09-17 MED ORDER — METOPROLOL TARTRATE 12.5 MG HALF TABLET
12.5000 mg | ORAL_TABLET | Freq: Two times a day (BID) | ORAL | Status: DC
  Administered 2017-09-18: 12.5 mg via ORAL
  Filled 2017-09-17: qty 1

## 2017-09-17 MED ORDER — BUPIVACAINE HCL (PF) 0.25 % IJ SOLN
INTRAMUSCULAR | Status: AC
  Filled 2017-09-17: qty 30

## 2017-09-17 MED ORDER — SUGAMMADEX SODIUM 500 MG/5ML IV SOLN
INTRAVENOUS | Status: DC | PRN
  Administered 2017-09-17: 200 mg via INTRAVENOUS

## 2017-09-17 MED ORDER — NITROGLYCERIN 0.4 MG SL SUBL: 0.4000 mg | SUBLINGUAL_TABLET | SUBLINGUAL | Status: DC | PRN

## 2017-09-17 MED ORDER — EPHEDRINE SULFATE 50 MG/ML IJ SOLN
INTRAMUSCULAR | Status: DC | PRN
  Administered 2017-09-17: 15 mg via INTRAVENOUS
  Administered 2017-09-17: 5 mg via INTRAVENOUS
  Administered 2017-09-17: 15 mg via INTRAVENOUS

## 2017-09-17 MED ORDER — SODIUM CHLORIDE 0.9% FLUSH
3.0000 mL | Freq: Two times a day (BID) | INTRAVENOUS | Status: DC
  Administered 2017-09-17: 3 mL via INTRAVENOUS

## 2017-09-17 MED ORDER — LIDOCAINE HCL (CARDIAC) PF 100 MG/5ML IV SOSY
PREFILLED_SYRINGE | INTRAVENOUS | Status: DC | PRN
  Administered 2017-09-17: 40 mg via INTRAVENOUS

## 2017-09-17 MED ORDER — PHENYLEPHRINE HCL 10 MG/ML IJ SOLN
INTRAMUSCULAR | Status: DC | PRN
  Administered 2017-09-17: 80 ug via INTRAVENOUS

## 2017-09-17 MED ORDER — ATORVASTATIN CALCIUM 80 MG PO TABS
80.0000 mg | ORAL_TABLET | Freq: Every day | ORAL | Status: DC
  Administered 2017-09-17: 80 mg via ORAL
  Filled 2017-09-17: qty 1

## 2017-09-17 MED ORDER — ROCURONIUM BROMIDE 100 MG/10ML IV SOLN
INTRAVENOUS | Status: DC | PRN
  Administered 2017-09-17: 50 mg via INTRAVENOUS

## 2017-09-17 MED ORDER — ACETAMINOPHEN 325 MG PO TABS: 650.0000 mg | ORAL_TABLET | ORAL | Status: DC | PRN

## 2017-09-17 MED ORDER — B COMPLEX PO TABS: 1.0000 | ORAL_TABLET | Freq: Every day | ORAL | Status: DC

## 2017-09-17 MED ORDER — BUPIVACAINE HCL (PF) 0.25 % IJ SOLN
INTRAMUSCULAR | Status: DC | PRN
  Administered 2017-09-17: 30 mL

## 2017-09-17 MED ORDER — PROTAMINE SULFATE 10 MG/ML IV SOLN
INTRAVENOUS | Status: DC | PRN
  Administered 2017-09-17 (×2): 15 mg via INTRAVENOUS

## 2017-09-17 MED ORDER — AMIODARONE HCL 200 MG PO TABS
400.0000 mg | ORAL_TABLET | Freq: Every day | ORAL | Status: DC
  Administered 2017-09-17 – 2017-09-18 (×2): 400 mg via ORAL
  Filled 2017-09-17 (×2): qty 2

## 2017-09-17 MED ORDER — LACTATED RINGERS IV SOLN: INTRAVENOUS | Status: DC | PRN

## 2017-09-17 MED ORDER — CLOPIDOGREL BISULFATE 75 MG PO TABS
75.0000 mg | ORAL_TABLET | Freq: Every day | ORAL | Status: DC
  Administered 2017-09-18: 75 mg via ORAL
  Filled 2017-09-17 (×2): qty 1

## 2017-09-17 MED ORDER — PROPOFOL 10 MG/ML IV BOLUS
INTRAVENOUS | Status: DC | PRN
  Administered 2017-09-17: 200 mg via INTRAVENOUS

## 2017-09-17 MED ORDER — SODIUM CHLORIDE 0.9 % IV SOLN: 250.0000 mL | INTRAVENOUS | Status: DC | PRN

## 2017-09-17 MED ORDER — ONDANSETRON HCL 4 MG/2ML IJ SOLN: 4.0000 mg | Freq: Four times a day (QID) | INTRAMUSCULAR | Status: DC | PRN

## 2017-09-17 MED ORDER — DOBUTAMINE IN D5W 4-5 MG/ML-% IV SOLN
INTRAVENOUS | Status: AC
  Filled 2017-09-17: qty 250

## 2017-09-17 MED ORDER — SODIUM CHLORIDE 0.9% FLUSH: 3.0000 mL | INTRAVENOUS | Status: DC | PRN

## 2017-09-17 MED ORDER — HEPARIN (PORCINE) IN NACL 2-0.9 UNITS/ML
INTRAMUSCULAR | Status: AC | PRN
  Administered 2017-09-17 (×5): 500 mL

## 2017-09-17 MED ORDER — SODIUM CHLORIDE 0.9 % IV SOLN
INTRAVENOUS | Status: DC
  Administered 2017-09-17 (×3): via INTRAVENOUS

## 2017-09-17 MED ORDER — FENTANYL CITRATE (PF) 100 MCG/2ML IJ SOLN
INTRAMUSCULAR | Status: DC | PRN
  Administered 2017-09-17: 100 ug via INTRAVENOUS

## 2017-09-17 MED ORDER — B COMPLEX-C PO TABS
1.0000 | ORAL_TABLET | Freq: Every day | ORAL | Status: DC
  Administered 2017-09-17 – 2017-09-18 (×2): 1 via ORAL
  Filled 2017-09-17 (×2): qty 1

## 2017-09-17 SURGICAL SUPPLY — 17 items
CATH MAPPNG PENTARAY F 2-6-2MM (CATHETERS) IMPLANT
CATH SMTCH THERMOCOOL SF DF (CATHETERS) ×2 IMPLANT
CATH SOUNDSTAR 3D IMAGING (CATHETERS) ×2 IMPLANT
CATH WEBSTER BI DIR CS D-F CRV (CATHETERS) ×2 IMPLANT
PACK EP LATEX FREE (CUSTOM PROCEDURE TRAY) ×3
PACK EP LF (CUSTOM PROCEDURE TRAY) ×1 IMPLANT
PAD DEFIB LIFELINK (PAD) ×3 IMPLANT
PATCH CARTO3 (PAD) ×2 IMPLANT
PENTARAY F 2-6-2MM (CATHETERS) ×3
SHEATH AVANTI 11F 11CM (SHEATH) ×2 IMPLANT
SHEATH BAYLIS SUREFLEX  M 8.5 (SHEATH) ×4
SHEATH BAYLIS SUREFLEX M 8.5 (SHEATH) IMPLANT
SHEATH BAYLIS TRANSSEPTAL 98CM (NEEDLE) ×2 IMPLANT
SHEATH PINNACLE 7F 10CM (SHEATH) ×2 IMPLANT
SHEATH PINNACLE 8F 10CM (SHEATH) ×4 IMPLANT
SHEATH PINNACLE 9F 10CM (SHEATH) ×4 IMPLANT
TUBING SMART ABLATE COOLFLOW (TUBING) ×2 IMPLANT

## 2017-09-17 NOTE — H&P (Signed)
Stephen King has presented today for surgery, with the diagnosis of atrial fibrillation.  The various methods of treatment have been discussed with the patient and family. After consideration of risks, benefits and other options for treatment, the patient has consented to  Procedure(s): Catheter ablation as a surgical intervention .  Risks include but not limited to bleeding, tamponade, heart block, stroke, damage to surrounding organs, among others. The patient's history has been reviewed, patient examined, no change in status, stable for surgery.  I have reviewed the patient's chart and labs.  Questions were answered to the patient's satisfaction.    [name], MD 09/17/2017 7:04 AM

## 2017-09-17 NOTE — Progress Notes (Signed)
Rt groin dressing partially saturated; removed and clean dressing applied. No oozing nor hematoma. Site level 0.

## 2017-09-17 NOTE — Progress Notes (Signed)
Right groin level 0, gauze/transparent dressing is clean, dry and intact at this time.  Left groin level 0, drainage has not exceeded previously marked area.

## 2017-09-17 NOTE — Discharge Instructions (Signed)
Post procedure care instructions No driving for 4 days. No lifting over 5 lbs for 1 week. No vigorous or sexual activity for 1 week. You may return to work on 09/25/17. Keep procedure site clean & dry. If you notice increased pain, swelling, bleeding or pus, call/return!  You may shower, but no soaking baths/hot tubs/pools for 1 week.     You have an appointment set up with the [facility].  Multiple studies have shown that being followed by a dedicated atrial fibrillation clinic in addition to the standard care you receive from your other physicians improves health. We believe that enrollment in the atrial fibrillation clinic will allow [place] to better care for you.   The phone number to the [facility] is [phone]. The clinic is staffed Monday through Friday from 8:30am to 5pm.  Parking Directions: The clinic is located in the Heart and Vascular Building connected to [facility]. 1)From [address] turn on to [address] and go to the 3rd entrance  (Heart and Vascular entrance) on the right. 2)Look to the right for Heart &Vascular Parking Garage. 3)A code for the entrance is required please call the clinic to receive this.   4)Take the elevators to the 1st floor. Registration is in the room with the glass walls at the end of the hallway.  If you have any trouble parking or locating the clinic, please dont hesitate to call [phone].

## 2017-09-17 NOTE — Anesthesia Postprocedure Evaluation (Signed)
Anesthesia Post Note  Patient: Stephen King  Procedure(s) Performed: ATRIAL FIBRILLATION ABLATION (N/A )     Patient location during evaluation: Cath Lab Anesthesia Type: MAC Level of consciousness: awake and alert Pain management: pain level controlled Vital Signs Assessment: post-procedure vital signs reviewed and stable Respiratory status: spontaneous breathing, nonlabored ventilation, respiratory function stable and patient connected to nasal cannula oxygen Cardiovascular status: blood pressure returned to baseline and stable Postop Assessment: no apparent nausea or vomiting Anesthetic complications: no    Last Vitals:  Vitals:   09/17/17 1345 09/17/17 2015  BP: 94/65 (!) 91/57  Pulse: (!) 57 60  Resp: 15 16  Temp: 36.4 C 37.1 C  SpO2: 94% 90%    Last Pain:  Vitals:   09/17/17 2015  TempSrc: Oral  PainSc:                  Flynn Lininger COKER

## 2017-09-17 NOTE — Progress Notes (Addendum)
Right groin dressing saturated with blood.  Dressing removed, slow oozing noted from one puncture site in right groin.  Pressure held x10 minutes, oozing stopped, new gauze and transparent dressing applied.  Old drainage noted on left groin dressing marked by previous RN.  Drainage has not exceeded previously marked area, left groin level 0.  Will continue to monitor.

## 2017-09-17 NOTE — Transfer of Care (Signed)
Immediate Anesthesia Transfer of Care Note  Patient: Stephen King  Procedure(s) Performed: ATRIAL FIBRILLATION ABLATION (N/A )  Patient Location: Cath Lab  Anesthesia Type:General  Level of Consciousness: awake, alert  and oriented  Airway & Oxygen Therapy: Patient Spontanous Breathing and Patient connected to nasal cannula oxygen  Post-op Assessment: Report given to RN, Post -op Vital signs reviewed and stable and Patient moving all extremities X 4  Post vital signs: Reviewed and stable  Last Vitals:  Vitals Value Taken Time  BP 108/70 09/17/2017 10:28 AM  Temp    Pulse 62 09/17/2017 10:30 AM  Resp 15 09/17/2017 10:30 AM  SpO2 93 % 09/17/2017 10:30 AM  Vitals shown include unvalidated device data.  Last Pain:  Vitals:   09/17/17 0611  TempSrc:   PainSc: 0-No pain         Complications: No apparent anesthesia complications

## 2017-09-17 NOTE — Discharge Summary (Addendum)
ELECTROPHYSIOLOGY PROCEDURE DISCHARGE SUMMARY    Patient ID: Stephen King,  MRN: [id number], DOB/AGE: 04/17/1950 67 y.o.  Admit date: 09/17/2017 Discharge date: 09/18/17  Primary Care Physician: Lavada Mesi Primary Cardiologist: Dr. Marlou Porch Electrophysiologist: Dr. Curt Bears  Primary Discharge Diagnosis:  1. Paroxysmal AFib     CHA2DS2Vasc is 4, on Xarelto  Secondary Discharge Diagnosis:  1. 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 CAD     S/p PCI 08/14/17     On ASA, Plavix, BB, statin  Procedures This Admission:  1.  Electrophysiology study and radiofrequency catheter ablation on 09/17/17 by Dr Curt Bears.  This study demonstrated    CONCLUSIONS: 1. Sinus rhythm upon presentation.   2. Successful electrical isolation and anatomical encircling of all four pulmonary veins with radiofrequency current. 3. No inducible arrhythmias following ablation both on and off of dobutamine 4. No early apparent complications.   Brief HPI: Stephen King is a 67 y.o. male with a history of paroxysmal atrial fibrillation.  He has failed medical therapy with amiodarone. Risks, benefits, and alternatives to catheter ablation of atrial fibrillation were reviewed with the patient who wished to proceed.  The patient underwent cardiac CT prior to the procedure which demonstrated no LAA thrombus.    Hospital Course:  The patient was admitted and underwent EPS/RFCA of atrial fibrillation with details as outlined above.  He was monitored on telemetry overnight which demonstrated SR.  b/l groins are without complication on the day of discharge.  The patient feels well this morning, has been OOB and ab=mbulated without symptoms, BP is at what he/his wife report is his baseline SBP 90's this AM, continue home meds, though Jayquon Theiler reduce amiodarone to 200mg  daily.  The patient was examined by Dr. Curt Bears and considered to be stable for discharge.  Wound care and restrictions were reviewed with the patient.  The patient Brenyn Petrey be seen  back by Roderic Palau, NP in 4 weeks and Dr Curt Bears in 12 weeks for post ablation follow up.     Physical Exam: Vitals:   09/17/17 2015 09/17/17 2137 09/18/17 0000 09/18/17 0451  BP: (!) 91/57 96/67 (!) 93/54 (!) 95/57  Pulse: 60 60 (!) 58 (!) 58  Resp: 16 17 18 18   Temp: 98.7 F (37.1 C)  98.2 F (36.8 C) 97.6 F (36.4 C)  TempSrc: Oral  Oral   SpO2: 90%  92% 93%  Weight:    218 lb 6.4 oz (99.1 kg)  Height:        GEN- The patient is well appearing, alert and oriented x 3 today.   HEENT: normocephalic, atraumatic; sclera clear, conjunctiva pink; hearing intact; oropharynx clear; neck supple  Lungs- CTA b/l, normal work of breathing.  No wheezes, rales, rhonchi Heart- RRR, no murmurs, rubs or gallops  GI- soft, non-tender, non-distended  Extremities- no clubbing, cyanosis, or edema; DP/PT/radial pulses 2+ bilaterally, b/l groins are without hematoma/bruit, right side with small ecchymosis MS- no significant deformity or atrophy Skin- warm and dry, no rash or lesion Psych- euthymic mood, full affect Neuro- strength and sensation are intact   Labs:   Lab Results  Component Value Date   WBC 8.1 08/18/2017   HGB 15.4 08/18/2017   HCT 44.6 08/18/2017   MCV 94 08/18/2017   PLT 315 08/18/2017   No results for input(s): NA, K, CL, CO2, BUN, CREATININE, CALCIUM, PROT, BILITOT, ALKPHOS, ALT, AST, GLUCOSE in the last 168 hours.  Invalid input(s): LABALBU   Discharge Medications:  Allergies as of  09/18/2017      Reactions   Feldene [piroxicam] Hives      Medication List    TAKE these medications   acetaminophen 325 MG tablet Commonly known as:  TYLENOL Take 2 tablets (650 mg total) by mouth every 6 (six) hours as needed for mild pain or headache.   amiodarone 200 MG tablet Commonly known as:  PACERONE Take 1 tablet (200 mg total) by mouth daily. What changed:  how much to take   aspirin 81 MG chewable tablet Chew 1 tablet (81 mg total) by mouth daily.     atorvastatin 80 MG tablet Commonly known as:  LIPITOR Take 1 tablet (80 mg total) by mouth daily at 6 PM.   b complex vitamins tablet Take 1 tablet by mouth daily.   cholecalciferol 1000 units tablet Commonly known as:  VITAMIN D Take 1,000 Units by mouth daily.   clopidogrel 75 MG tablet Commonly known as:  PLAVIX Take 1 tablet (75 mg total) by mouth daily with breakfast.   FISH OIL PO Take 1 capsule by mouth daily.   metoprolol tartrate 25 MG tablet Commonly known as:  LOPRESSOR Take 0.5 tablets (12.5 mg total) by mouth 2 (two) times daily.   nitroGLYCERIN 0.4 MG SL tablet Commonly known as:  NITROSTAT Place 0.4 mg under the tongue every 5 (five) minutes x 3 doses as needed for chest pain.   oxyCODONE 5 MG immediate release tablet Commonly known as:  Oxy IR/ROXICODONE Take 1 tablet (5 mg total) by mouth every 6 (six) hours as needed for moderate pain.   XARELTO 20 MG Tabs tablet Generic drug:  rivaroxaban TAKE 1 TABLET BY MOUTH  DAILY WITH SUPPER       Disposition:  Home  Discharge Instructions    Diet - low sodium heart healthy   Complete by:  As directed    Increase activity slowly   Complete by:  As directed      Follow-up Information    [facility] Follow up on 10/12/2017.   Specialty:  Cardiology Why:  12:00PM (noon) Contact information: [address] [id number] Danice G[place] [address] [phone]       Constance Haw, MD Follow up on 12/16/2017.   Specialty:  Cardiology Why:  11:30AM Contact information: [place] [address] [phone]           Duration of Discharge Encounter: Greater than 30 minutes including physician time.  Venetia Night, PA-C 09/18/2017 7:49 AM  I have seen and examined this patient with Tommye Standard.  Agree with above, note added to reflect my findings.  On exam, RRR, no murmurs, lungs clear.  Patient had AF ablation for  paroxysmal atrial fibrillation. Tolerated the procedure well without complaint this morning. Plan for discharge today with follow up in EP clinic.  Yenesis Even M. Yuvin Bussiere MD 09/18/2017 7:53 AM

## 2017-09-17 NOTE — Anesthesia Preprocedure Evaluation (Addendum)
Anesthesia Evaluation  Patient identified by MRN, date of birth, ID band Patient awake    Reviewed: Allergy & Precautions, H&P , NPO status   History of Anesthesia Complications (+) PROLONGED EMERGENCE  Airway Mallampati: II  TM Distance: >3 FB Neck ROM: full    Dental  (+) Dental Advidsory Given, Edentulous Upper, Partial Lower   Pulmonary former smoker,    + rhonchi        Cardiovascular + angina with exertion + CAD  + dysrhythmias Atrial Fibrillation  Rhythm:irregular Rate:Normal     Neuro/Psych CVA, No Residual Symptoms    GI/Hepatic hiatal hernia,   Endo/Other    Renal/GU      Musculoskeletal   Abdominal   Peds  Hematology   Anesthesia Other Findings Coronary artery stent May 23,2019.  Reproductive/Obstetrics                          Anesthesia Physical Anesthesia Plan  ASA: III  Anesthesia Plan: MAC and General   Post-op Pain Management:    Induction: Intravenous  PONV Risk Score and Plan: Ondansetron and Dexamethasone  Airway Management Planned: Oral ETT  Additional Equipment:   Intra-op Plan:   Post-operative Plan: Extubation in OR  Informed Consent: I have reviewed the patients History and Physical, chart, labs and discussed the procedure including the risks, benefits and alternatives for the proposed anesthesia with the patient or authorized representative who has indicated his/her understanding and acceptance.   Dental advisory given and Dental Advisory Given  Plan Discussed with: CRNA and Anesthesiologist  Anesthesia Plan Comments:        Anesthesia Quick Evaluation

## 2017-09-17 NOTE — Anesthesia Procedure Notes (Signed)
Procedure Name: Intubation Date/Time: 09/17/2017 7:37 AM Performed by: Neldon Newport, CRNA Pre-anesthesia Checklist: Timeout performed, Patient being monitored, Suction available, Emergency Drugs available and Patient identified Patient Re-evaluated:Patient Re-evaluated prior to induction Oxygen Delivery Method: Circle system utilized Preoxygenation: Pre-oxygenation with 100% oxygen Induction Type: IV induction Ventilation: Mask ventilation without difficulty Laryngoscope Size: Mac and 4 Grade View: Grade I Tube type: Oral Tube size: 7.5 mm Number of attempts: 1 Placement Confirmation: breath sounds checked- equal and bilateral,  positive ETCO2 and ETT inserted through vocal cords under direct vision Secured at: 23 cm Tube secured with: Tape Dental Injury: Teeth and Oropharynx as per pre-operative assessment

## 2017-09-17 NOTE — Progress Notes (Signed)
Site area: Right and Left femoral Site prior to removal:  All sites Level 0 Right femoral: 2- 36F Left Femoral: 7, 50F Pressure applied for: 10 minutes Manual: yes Patient status during removal: stable Post removal site status: Level 0 for all sites Post instructions given: yes Post removal pulses: present bilat. - PT Dressing type: gauze w/ transparent - bilat. Bedrest begins @: 1100  Comments:  C. Dusty Raczkowski and N. Zafiris pulled sheaths

## 2017-09-17 NOTE — Progress Notes (Signed)
Patient's recent blood pressure 96/67, heart rate primarily 50's-60.  Patient has 12.5mg  of Metoprolol due this evening.  RN text paged Cardiology with patient recent vials signs.  Per Dr. Lamona Curl on call with Cardiology hold tonight's dose of Metoprolol.

## 2017-09-18 DIAGNOSIS — Z7982 Long term (current) use of aspirin: Secondary | ICD-10-CM | POA: Diagnosis not present

## 2017-09-18 DIAGNOSIS — I480 Paroxysmal atrial fibrillation: Secondary | ICD-10-CM | POA: Diagnosis not present

## 2017-09-18 DIAGNOSIS — I499 Cardiac arrhythmia, unspecified: Secondary | ICD-10-CM | POA: Diagnosis not present

## 2017-09-18 DIAGNOSIS — Z9841 Cataract extraction status, right eye: Secondary | ICD-10-CM | POA: Diagnosis not present

## 2017-09-18 DIAGNOSIS — Z79899 Other long term (current) drug therapy: Secondary | ICD-10-CM | POA: Diagnosis not present

## 2017-09-18 DIAGNOSIS — Z9842 Cataract extraction status, left eye: Secondary | ICD-10-CM | POA: Diagnosis not present

## 2017-09-18 DIAGNOSIS — Z87891 Personal history of nicotine dependence: Secondary | ICD-10-CM | POA: Diagnosis not present

## 2017-09-18 DIAGNOSIS — I2510 Atherosclerotic heart disease of native coronary artery without angina pectoris: Secondary | ICD-10-CM | POA: Diagnosis not present

## 2017-09-18 DIAGNOSIS — Z7901 Long term (current) use of anticoagulants: Secondary | ICD-10-CM | POA: Diagnosis not present

## 2017-09-18 DIAGNOSIS — Z87442 Personal history of urinary calculi: Secondary | ICD-10-CM | POA: Diagnosis not present

## 2017-09-18 DIAGNOSIS — Z9889 Other specified postprocedural states: Secondary | ICD-10-CM | POA: Diagnosis not present

## 2017-09-18 DIAGNOSIS — E669 Obesity, unspecified: Secondary | ICD-10-CM | POA: Diagnosis not present

## 2017-09-18 DIAGNOSIS — Z981 Arthrodesis status: Secondary | ICD-10-CM | POA: Diagnosis not present

## 2017-09-18 DIAGNOSIS — Z955 Presence of coronary angioplasty implant and graft: Secondary | ICD-10-CM | POA: Diagnosis not present

## 2017-09-18 DIAGNOSIS — Z8673 Personal history of transient ischemic attack (TIA), and cerebral infarction without residual deficits: Secondary | ICD-10-CM | POA: Diagnosis not present

## 2017-09-18 DIAGNOSIS — M1711 Unilateral primary osteoarthritis, right knee: Secondary | ICD-10-CM | POA: Diagnosis not present

## 2017-09-18 DIAGNOSIS — Z823 Family history of stroke: Secondary | ICD-10-CM | POA: Diagnosis not present

## 2017-09-18 DIAGNOSIS — Z8249 Family history of ischemic heart disease and other diseases of the circulatory system: Secondary | ICD-10-CM | POA: Diagnosis not present

## 2017-09-18 MED ORDER — AMIODARONE HCL 200 MG PO TABS: 200.0000 mg | ORAL_TABLET | Freq: Every day | ORAL | 3 refills | Status: DC

## 2017-09-18 MED FILL — Heparin Sod (Porcine)-NaCl IV Soln 1000 Unit/500ML-0.9%: INTRAVENOUS | Qty: 500 | Status: AC

## 2017-09-18 NOTE — Progress Notes (Signed)
Bedrest up.  Bilateral groin sites checked and are level 0.  Patient ambulated to room doorway and then to bathroom and then back to bed.  Bilateral groin sites checked once patient back in bed and remain level 0.

## 2017-09-22 ENCOUNTER — Encounter (HOSPITAL_COMMUNITY): Payer: Self-pay | Admitting: Cardiology

## 2017-09-28 ENCOUNTER — Encounter: Payer: Self-pay | Admitting: Cardiology

## 2017-09-29 ENCOUNTER — Ambulatory Visit (HOSPITAL_COMMUNITY)
Admission: RE | Admit: 2017-09-29 | Discharge: 2017-09-29 | Disposition: A | Discharge status: Home / Self Care | Payer: Medicare Other | Source: Ambulatory Visit | Attending: Cardiovascular Disease | Admitting: Cardiovascular Disease

## 2017-09-29 ENCOUNTER — Telehealth: Payer: Self-pay | Admitting: Cardiology

## 2017-09-29 DIAGNOSIS — M79605 Pain in left leg: Secondary | ICD-10-CM | POA: Diagnosis not present

## 2017-09-29 DIAGNOSIS — M79604 Pain in right leg: Secondary | ICD-10-CM

## 2017-09-29 DIAGNOSIS — R1031 Right lower quadrant pain: Secondary | ICD-10-CM

## 2017-09-29 DIAGNOSIS — Z8679 Personal history of other diseases of the circulatory system: Secondary | ICD-10-CM

## 2017-09-29 DIAGNOSIS — Z9889 Other specified postprocedural states: Secondary | ICD-10-CM | POA: Insufficient documentation

## 2017-09-29 DIAGNOSIS — R1032 Left lower quadrant pain: Secondary | ICD-10-CM

## 2017-09-29 DIAGNOSIS — R1030 Lower abdominal pain, unspecified: Secondary | ICD-10-CM | POA: Diagnosis not present

## 2017-09-29 NOTE — Telephone Encounter (Signed)
Pt scheduled to have done at [place] office this afternoon at 2:30 pm. Pt instructed to arrive 15 min prior to appt time. Patient verbalized understanding and agreeable to plan.

## 2017-09-29 NOTE — Telephone Encounter (Signed)
Pt sent MyChart message last night stating:  Hello Dr Curt Bears.Marland KitchenMarland KitchenSince Atrial Ablation on June 27 I am having severe, excruciating pain in the catheter site area in my groin and upper, inner thigh on both sides. More on the right leg. Cannot get comfortable in bed, cannot sleep and still not able to work. I will have to reschedule sleep study on July 11 for a later date because I cannot sleep. Pain meds (Tylenol) are no help. Any advice for me to get relief?  Thank you,  Stephen King

## 2017-09-29 NOTE — Telephone Encounter (Signed)
Pt reports pain since ablation.  Bilateral pain occurring from "mid thigh down to my knee".  "Right is worse than left", "but they both are painful".   Pain level reported is:  daytime = 7 and nighttime, laying down = 9. Reports bruising around groin is "deep, dark black/blue" and around "mid-thigh" bruising is about size of a hand.  He refers to it as "a deep bone bruise, like we use to call it when playing football". He can't return to work secondary to pain nor can he sleep. Pt aware I will review with another EP physician in the office, as Dr. Curt Bears is out of the office until 7/22, and call him w/ recommendation/s.

## 2017-09-29 NOTE — Telephone Encounter (Signed)
New message   Patient requesting call from nurse

## 2017-09-29 NOTE — Telephone Encounter (Signed)
Informed pt that office would contact him to have [place] study/studies performed at our [place] office to evaluate areas of complaint and r/o clot/s. Patient verbalized understanding and agreeable to plan.

## 2017-10-01 ENCOUNTER — Encounter (HOSPITAL_BASED_OUTPATIENT_CLINIC_OR_DEPARTMENT_OTHER): Payer: Medicare Other

## 2017-10-12 ENCOUNTER — Encounter (HOSPITAL_COMMUNITY): Payer: Self-pay | Admitting: Nurse Practitioner

## 2017-10-12 ENCOUNTER — Ambulatory Visit (HOSPITAL_COMMUNITY)
Admission: RE | Admit: 2017-10-12 | Discharge: 2017-10-12 | Disposition: A | Discharge status: Home / Self Care | Payer: Medicare Other | Source: Ambulatory Visit | Attending: Nurse Practitioner | Admitting: Nurse Practitioner

## 2017-10-12 VITALS — BP 110/68 | HR 59 | Ht 70.0 in | Wt 215.0 lb

## 2017-10-12 DIAGNOSIS — Z9842 Cataract extraction status, left eye: Secondary | ICD-10-CM | POA: Diagnosis not present

## 2017-10-12 DIAGNOSIS — Z7901 Long term (current) use of anticoagulants: Secondary | ICD-10-CM | POA: Diagnosis not present

## 2017-10-12 DIAGNOSIS — Z9841 Cataract extraction status, right eye: Secondary | ICD-10-CM | POA: Insufficient documentation

## 2017-10-12 DIAGNOSIS — Z8673 Personal history of transient ischemic attack (TIA), and cerebral infarction without residual deficits: Secondary | ICD-10-CM | POA: Diagnosis not present

## 2017-10-12 DIAGNOSIS — Z7982 Long term (current) use of aspirin: Secondary | ICD-10-CM | POA: Diagnosis not present

## 2017-10-12 DIAGNOSIS — Z79899 Other long term (current) drug therapy: Secondary | ICD-10-CM | POA: Diagnosis not present

## 2017-10-12 DIAGNOSIS — Z955 Presence of coronary angioplasty implant and graft: Secondary | ICD-10-CM | POA: Diagnosis not present

## 2017-10-12 DIAGNOSIS — I480 Paroxysmal atrial fibrillation: Secondary | ICD-10-CM | POA: Diagnosis not present

## 2017-10-12 DIAGNOSIS — Z87891 Personal history of nicotine dependence: Secondary | ICD-10-CM | POA: Insufficient documentation

## 2017-10-12 NOTE — Progress Notes (Signed)
Primary Care Physician: Lavada Mesi Referring Physician:Dr. Joylene Min is a 67 y.o. male with a h/o afib on amiodarone, xarelto in for evaluation in the afib clinic, for f/u ablation.He reports that he had some bruising of his legs after the procedure and had f/u U/S that was without significant findings. He has had improvement in his bruising and discomfort. He has not noted any afib and denies any swallowing difficulties.  Today, he denies symptoms of palpitations, chest pain, shortness of breath, orthopnea, PND, lower extremity edema, dizziness, presyncope, syncope, or neurologic sequela. The patient is tolerating medications without difficulties and is otherwise without complaint today.   Past Medical History:  Diagnosis Date  . Arthritis    "?right knee" (08/13/2017)  . Dysrhythmia    PAF  . Hernia, umbilical   . History of hiatal hernia   . History of kidney stones   . Stroke [facility]) 12/2009   "has to watch where he's walking since; more of a spacial thing resulting from stroke" (08/13/2017)   Past Surgical History:  Procedure Laterality Date  . ABDOMINAL HERNIA REPAIR     "has a mesh in his abdomen"  . ANTERIOR CERVICAL DECOMP/DISCECTOMY FUSION  10/2005   C6-7  . ATRIAL FIBRILLATION ABLATION N/A 09/17/2017   Procedure: ATRIAL FIBRILLATION ABLATION;  Surgeon: Constance Haw, MD;  Location: [facility] CV LAB;  Service: Cardiovascular;  Laterality: N/A;  . BACK SURGERY    . CARDIAC CATHETERIZATION  2003  . CATARACT EXTRACTION W/ INTRAOCULAR LENS  IMPLANT, BILATERAL Bilateral   . CORONARY ANGIOPLASTY WITH STENT PLACEMENT  08/13/2017  . CORONARY STENT INTERVENTION N/A 08/13/2017   Procedure: CORONARY STENT INTERVENTION;  Surgeon: Burnell Blanks, MD;  Location: [place] CV LAB;  Service: Cardiovascular;  Laterality: N/A;  . EXCISIONAL HEMORRHOIDECTOMY    . HERNIA REPAIR    . LEFT HEART CATH AND CORONARY ANGIOGRAPHY N/A 08/13/2017   Procedure: LEFT HEART CATH AND CORONARY ANGIOGRAPHY;  Surgeon: Burnell Blanks, MD;  Location: [place] CV LAB;  Service: Cardiovascular;  Laterality: N/A;  . SHOULDER ARTHROSCOPY WITH ROTATOR CUFF REPAIR Right 10/30/2014   Procedure: SHOULDER ARTHROSCOPY WITH ROTATOR CUFF REPAIR;  Surgeon: Tania Ade, MD;  Location: [place];  Service: Orthopedics;  Laterality: Right;  . SHOULDER ARTHROSCOPY WITH SUBACROMIAL DECOMPRESSION Right 10/30/2014   Procedure: Right shoulder arthroscopy rotator cuff repair, subacromial decompression;  Surgeon: Tania Ade, MD;  Location: [place];  Service: Orthopedics;  Laterality: Right;  Right shoulder arthrscopy rotator cuff repair, subacromial decompression    Current Outpatient Medications  Medication Sig Dispense Refill  . amiodarone (PACERONE) 200 MG tablet Take 1 tablet (200 mg total) by mouth daily. 30 tablet 3  . aspirin 81 MG chewable tablet Chew 1 tablet (81 mg total) by mouth daily.    Marland Kitchen atorvastatin (LIPITOR) 80 MG tablet Take 1 tablet (80 mg total) by mouth daily at 6 PM. 90 tablet 3  . b complex vitamins tablet Take 1 tablet by mouth daily.    . cholecalciferol (VITAMIN D) 1000 units tablet Take 1,000 Units by mouth daily.    . clopidogrel (PLAVIX) 75 MG tablet Take 1 tablet (75 mg total) by mouth daily with breakfast. 90 tablet 3  . metoprolol tartrate (LOPRESSOR) 25 MG tablet Take 0.5 tablets (12.5 mg total) by mouth 2 (two) times daily. 90 tablet 3  . nitroGLYCERIN (NITROSTAT) 0.4 MG SL tablet Place 0.4 mg under the tongue  every 5 (five) minutes x 3 doses as needed for chest pain.    . Omega-3 Fatty Acids (FISH OIL PO) Take 1 capsule by mouth daily.    Marland Kitchen oxyCODONE (OXY IR/ROXICODONE) 5 MG immediate release tablet Take 1 tablet (5 mg total) by mouth every 6 (six) hours as needed for moderate pain. 15 tablet 0  . XARELTO 20 MG TABS tablet TAKE 1 TABLET BY MOUTH  DAILY WITH SUPPER 90 tablet 1  .  acetaminophen (TYLENOL) 325 MG tablet Take 2 tablets (650 mg total) by mouth every 6 (six) hours as needed for mild pain or headache. (Patient not taking: Reported on 09/16/2017)     No current facility-administered medications for this encounter.     Allergies  Allergen Reactions  . Feldene [Piroxicam] Hives    Social History   Socioeconomic History  . Marital status: Married    Spouse name: Not on file  . Number of children: Not on file  . Years of education: Not on file  . Highest education level: Not on file  Occupational History  . Not on file  Social Needs  . Financial resource strain: Not on file  . Food insecurity:    Worry: Not on file    Inability: Not on file  . Transportation needs:    Medical: Not on file    Non-medical: Not on file  Tobacco Use  . Smoking status: Former Smoker    Years: 27.00    Types: Cigarettes    Last attempt to quit: 03/25/1999    Years since quitting: 18.5  . Smokeless tobacco: Never Used  Substance and Sexual Activity  . Alcohol use: No  . Drug use: No  . Sexual activity: Not Currently  Lifestyle  . Physical activity:    Days per week: Not on file    Minutes per session: Not on file  . Stress: Not on file  Relationships  . Social connections:    Talks on phone: Not on file    Gets together: Not on file    Attends religious service: Not on file    Active member of club or organization: Not on file    Attends meetings of clubs or organizations: Not on file    Relationship status: Not on file  . Intimate partner violence:    Fear of current or ex partner: Not on file    Emotionally abused: Not on file    Physically abused: Not on file    Forced sexual activity: Not on file  Other Topics Concern  . Not on file  Social History Narrative  . Not on file    Family History  Problem Relation Age of Onset  . Alcohol abuse Mother   . Arrhythmia Mother   . Colon cancer Father   . Alcohol abuse Father   . Stroke Maternal  Grandfather     ROS- All systems are reviewed and negative except as per the HPI above  Physical Exam: Vitals:   10/12/17 1059  BP: 110/68  Pulse: (!) 59  Weight: 215 lb (97.5 kg)  Height: 5\' 10"  (1.778 m)   Wt Readings from Last 3 Encounters:  10/12/17 215 lb (97.5 kg)  09/18/17 218 lb 6.4 oz (99.1 kg)  08/18/17 217 lb 6.4 oz (98.6 kg)    Labs: Lab Results  Component Value Date   NA 141 08/18/2017   K 4.5 08/18/2017   CL 102 08/18/2017   CO2 22 08/18/2017   GLUCOSE 86  08/18/2017   BUN 11 08/18/2017   CREATININE 1.10 08/18/2017   CALCIUM 9.2 08/18/2017   Lab Results  Component Value Date   INR 0.95 12/25/2009   Lab Results  Component Value Date   CHOL 151 11/02/2015   HDL 53 11/02/2015   LDLCALC 90 11/02/2015   TRIG 42 11/02/2015     GEN- The patient is well appearing, alert and oriented x 3 today.   Head- normocephalic, atraumatic Eyes-  Sclera clear, conjunctiva pink Ears- hearing intact Oropharynx- clear Neck- supple, no JVP Lymph- no cervical lymphadenopathy Lungs- Clear to ausculation bilaterally, normal work of breathing Heart- Regular rate and rhythm, no murmurs, rubs or gallops, PMI not laterally displaced GI- soft, NT, ND, + BS Extremities- no clubbing, cyanosis, or edema MS- no significant deformity or atrophy Skin- no rash or lesion Psych- euthymic mood, full affect Neuro- strength and sensation are intact  EKG-Sinus brady at 59 bpm, pr int 198 ms, qtc 417 ms Epic records reviewed    Assessment and Plan: 1. Paroxysmal afib S/p ablation Maintaining SR Groin/bruising issues are improved Continue amiodarone for now, will probably be stopped on f/u with Dr. Curt Bears in September Continue [place] for chadsvasc score of 3 High risk of bleeding currently 2/2 to stent placement in May and currently also on Plavix and asa Bleeding precautions discussed  F/u with Dr. Ileana Ladd 9/25  Geroge Baseman. Sharifah Champine, [facility] [address] [place], [place] [address] [phone]

## 2017-10-13 ENCOUNTER — Encounter: Payer: Self-pay | Admitting: Cardiology

## 2017-10-14 ENCOUNTER — Other Ambulatory Visit: Payer: Self-pay | Admitting: Pharmacist

## 2017-10-14 MED ORDER — RIVAROXABAN 20 MG PO TABS: ORAL_TABLET | ORAL | 1 refills | Status: DC

## 2017-10-15 ENCOUNTER — Telehealth (HOSPITAL_COMMUNITY): Payer: Self-pay

## 2017-10-15 NOTE — Telephone Encounter (Signed)
Called patient to see if he was interested in participating in the Cardiac Rehab Program. Patient stated yes. Patient will come in for orientation on 11/24/17 @ 8:15AM and will attend the 8:15AM exercise class.  Mailed homework package.

## 2017-10-26 ENCOUNTER — Telehealth (HOSPITAL_COMMUNITY): Payer: Self-pay

## 2017-10-26 NOTE — Telephone Encounter (Signed)
Patient called and stated he will not be able to participate in Cardiac Rehab due to the $20.00 co-pay. Cancelled appts and closed referral.

## 2017-10-27 ENCOUNTER — Ambulatory Visit (HOSPITAL_BASED_OUTPATIENT_CLINIC_OR_DEPARTMENT_OTHER): Payer: Medicare Other | Attending: Cardiology | Admitting: Cardiology

## 2017-10-27 DIAGNOSIS — R400 Somnolence: Secondary | ICD-10-CM | POA: Insufficient documentation

## 2017-10-27 DIAGNOSIS — G4733 Obstructive sleep apnea (adult) (pediatric): Secondary | ICD-10-CM | POA: Diagnosis not present

## 2017-10-27 DIAGNOSIS — R0683 Snoring: Secondary | ICD-10-CM | POA: Diagnosis not present

## 2017-10-28 NOTE — Procedures (Signed)
   Patient Name: Stephen King, Dipiero Date:03/10/2017 10/27/2017 Gender: Male D.O.B: Apr 09, 1950 Age (years): 53 Referring Provider: Will Camnitz Height (inches): 70 Interpreting Physician: Fransico Him MD, ABSM Weight (lbs): 215 RPSGT: Carolin Coy BMI: 31 MRN: [id number] Neck Size: 17.00  CLINICAL INFORMATION  Sleep Study Type: NPSG  Indication for sleep study: Fatigue, Snoring  Epworth Sleepiness Score: 4  SLEEP STUDY TECHNIQUE  As per the AASM Manual for the Scoring of Sleep and Associated Events v2.3 (April 2016) with a hypopnea requiring 4% desaturations. The channels recorded and monitored were frontal, central and occipital EEG, electrooculogram (EOG), submentalis EMG (chin), nasal and oral airflow, thoracic and abdominal wall motion, anterior tibialis EMG, snore microphone, electrocardiogram, and pulse oximetry.  MEDICATIONS  Medications self-administered by patient taken the night of the study : N/A  SLEEP ARCHITECTURE  The study was initiated at 10:48:33 PM and ended at 4:46:56 AM. Sleep onset time was 20.2 minutes and the sleep efficiency was 60.1%%. The total sleep time was 215.5 minutes. Stage REM latency was 237.0 minutes. The patient spent 17.6%% of the night in stage N1 sleep, 75.2%% in stage N2 sleep, 0.0%% in stage N3 and 7.2% in REM. Alpha intrusion was absent. Supine sleep was 0.00%.  RESPIRATORY PARAMETERS  The overall apnea/hypopnea index (AHI) was 6.7 per hour. There were 2 total apneas, including 1 obstructive, 1 central and 0 mixed apneas. There were 22 hypopneas and 22 RERAs. The AHI during Stage REM sleep was 11.6 per hour. AHI while supine was N/A per hour. The mean oxygen saturation was 93.8%. The minimum SpO2 during sleep was 88.0%. soft snoring was noted during this study.  CARDIAC DATA  The 2 lead EKG demonstrated sinus rhythm. The mean heart rate was 49.7 beats per minute. Other EKG findings include: None.  LEG MOVEMENT DATA  The total  PLMS were 0 with a resulting PLMS index of 0.0. Associated arousal with leg movement index was 0.6 .  IMPRESSIONS  - Mild obstructive sleep apnea occurred during this study (AHI = 6.7/h). - No significant central sleep apnea occurred during this study (CAI = 0.3/h). - The patient had minimal or no oxygen desaturation during the study (Min O2 = 88.0%) - The patient snored with soft snoring volume. - No cardiac abnormalities were noted during this study. - Clinically significant periodic limb movements did not occur during sleep. No significant associated arousals.  DIAGNOSIS  - Obstructive Sleep Apnea (327.23 [G47.33 ICD-10])  RECOMMENDATIONS  - Mild obtructive sleep apnea.  - In light of patient's history of PAF, CVA and CAD, recommend CPAP titration for sleep disordered breathing.  - Avoid alcohol, sedatives and other CNS depressants that may worsen sleep apnea and disrupt normal sleep architecture. - Sleep hygiene should be reviewed to assess factors that may improve sleep quality. - Weight management and regular exercise should be initiated or continued if appropriate.  [Electronically signed] 10/28/2017 01:23 PM  Fransico Him MD, ABSM Diplomate, American Board of Sleep Medicine

## 2017-11-06 ENCOUNTER — Telehealth: Payer: Self-pay | Admitting: *Deleted

## 2017-11-06 DIAGNOSIS — R400 Somnolence: Secondary | ICD-10-CM

## 2017-11-06 NOTE — Telephone Encounter (Signed)
-----   Message from Sueanne Margarita, MD sent at 10/28/2017  1:26 PM EDT ----- Please let patient know that they have sleep apnea and recommend CPAP titration. Please set up titration in the sleep lab.

## 2017-11-06 NOTE — Telephone Encounter (Signed)
Called results lmtcb on mobile number.

## 2017-11-10 NOTE — Telephone Encounter (Signed)
Informed patient of sleep study results and patient understanding was verbalized. Patient understands his sleep study showed they have sleep apnea and recommend CPAP titration.  Pt is aware and agreeable to these results   Titration sent to precert 

## 2017-11-11 NOTE — Telephone Encounter (Addendum)
Patient is scheduled for titration study on 12/14/17. Patient understands his titration study will be done at [facility] sleep lab. Patient understands he will receive a sleep packet in a week or so. Patient understands to call if he does not receive the sleep packet in a timely manner. Patient agrees with treatment and thanked me for call.

## 2017-11-11 NOTE — Addendum Note (Signed)
Addended by: Freada Bergeron on: 11/11/2017 12:17 PM   Modules accepted: Orders

## 2017-11-11 NOTE — Telephone Encounter (Signed)
Per [facility] Prior authorization is not required for CPAP titration,  Decision [id number]

## 2017-11-13 ENCOUNTER — Encounter: Payer: Self-pay | Admitting: *Deleted

## 2017-11-17 ENCOUNTER — Ambulatory Visit (INDEPENDENT_AMBULATORY_CARE_PROVIDER_SITE_OTHER): Payer: Medicare Other | Admitting: Physician Assistant

## 2017-11-17 ENCOUNTER — Encounter: Payer: Self-pay | Admitting: Physician Assistant

## 2017-11-17 VITALS — BP 123/81 | HR 56 | Ht 70.0 in | Wt 216.0 lb

## 2017-11-17 DIAGNOSIS — Z23 Encounter for immunization: Secondary | ICD-10-CM | POA: Diagnosis not present

## 2017-11-17 DIAGNOSIS — M7042 Prepatellar bursitis, left knee: Secondary | ICD-10-CM | POA: Diagnosis not present

## 2017-11-17 DIAGNOSIS — G4733 Obstructive sleep apnea (adult) (pediatric): Secondary | ICD-10-CM | POA: Insufficient documentation

## 2017-11-17 NOTE — Assessment & Plan Note (Signed)
Currently on Xarelto and Plavix. Aspiration and injection, strapped with compressive dressing. Return in 1 month.

## 2017-11-17 NOTE — Progress Notes (Signed)
   Subjective:    Patient ID: Stephen King, male    DOB: 11/20/1950, 67 y.o.   MRN: [id number]  HPI Patient is a 67 year old male with a complex cardiac history of atrial fibrillation, CAD who presents to the clinic with left knee swelling.  He has noticed this for the last month or so.  He denies any trauma but he is Catholic and when he goes to mass there is a lot of kneeling associated.  He denies any significant pain or discomfort.  He has not tried anything to make better.  .. Active Ambulatory Problems    Diagnosis Date Noted  . PARESTHESIA 12/24/2010  . HEAD TRAUMA, CLOSED 12/24/2010  . 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 Urgency of urination 08/26/2011  . Mass of right side of neck 06/06/2013  . Chest pain 08/21/2013  . Atrial fibrillation with RVR ([place]) 08/21/2013  . 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 PAF (paroxysmal atrial fibrillation) ([place]) 08/29/2013  . Atypical nevus 08/29/2013  . History of CVA (cerebrovascular accident) 10/06/2013  . Chronic anticoagulation 10/06/2013  . Primary osteoarthritis of both knees 10/07/2013  . 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 Right shoulder pain 11/04/2013  . Acute bronchitis 10/19/2014  . Pre-operative cardiovascular examination 10/27/2014  . left knee pain 10/26/2015  . Multiple joint pain 03/10/2016  . Weakness 03/10/2016  . Myalgia 03/10/2016  . No energy 08/11/2016  . Malaise 08/11/2016  . Atrial flutter ([place]) 07/28/2017  . Unstable angina ([place])   . CAD S/P percutaneous coronary angioplasty 08/14/2017  . Atrial fibrillation ([place]) 08/16/2017  . Paroxysmal atrial fibrillation ([place]) 09/17/2017  . OSA (obstructive sleep apnea) 11/17/2017  . Hemorrhagic prepatellar bursitis of left knee 11/17/2017   Resolved Ambulatory Problems    Diagnosis Date Noted  . Osteoarthritis of left knee 10/26/2015   Past Medical History:  Diagnosis Date  . Arthritis   . Dysrhythmia   . Hernia, umbilical   . History of hiatal hernia   . History  of kidney stones   . Stroke [facility]) 12/2009      Review of Systems  All other systems reviewed and are negative.      Objective:   Physical Exam  Constitutional: He is oriented to person, place, and time. He appears well-developed and well-nourished.  HENT:  Head: Normocephalic and atraumatic.  Cardiovascular: Normal rate and regular rhythm.  Musculoskeletal:  Left knee: pre-patellar bursa  4cm by 4cm non tender and not warm to touch.   Neurological: He is alert and oriented to person, place, and time.  Psychiatric: He has a normal mood and affect. His behavior is normal.          Assessment & Plan:  Marland KitchenMarland KitchenDiagnoses and all orders for this visit:  Hemorrhagic prepatellar bursitis of left knee  Need for immunization against influenza -     Flu Vaccine QUAD 36+ mos IM   Consulted sports medicine doctor Thekkekandam who preformed drainage procedure.  HO given. Follow up with Dr. Darene Lamer in 1 month. Discussed rest, ice, compression.

## 2017-11-17 NOTE — Progress Notes (Signed)
Subjective:    CC: Left knee swelling  HPI: Stephen King is a pleasant 67 year old male, without trauma over the past several days he is developed swelling over the anterior aspect of his left knee, not painful, simply unsightly and in the way.  Symptoms are mild, persistent, localized without radiation.  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  I reviewed the past medical history, family history, social history, surgical history, and allergies today and no changes were needed.  Please see the problem list section below in epic for further details.  Past Medical History: Past Medical History:  Diagnosis Date  . Arthritis    "?right knee" (08/13/2017)  . Dysrhythmia    PAF  . Hernia, umbilical   . History of hiatal hernia   . History of kidney stones   . Stroke [facility]) 12/2009   "has to watch where he's walking since; more of a spacial thing resulting from stroke" (08/13/2017)   Past Surgical History: Past Surgical History:  Procedure Laterality Date  . ABDOMINAL HERNIA REPAIR     "has a mesh in his abdomen"  . ANTERIOR CERVICAL DECOMP/DISCECTOMY FUSION  10/2005   C6-7  . ATRIAL FIBRILLATION ABLATION N/A 09/17/2017   Procedure: ATRIAL FIBRILLATION ABLATION;  Surgeon: Constance Haw, MD;  Location: [place] [facility];  Service: Cardiovascular;  Laterality: N/A;  . BACK SURGERY    . CARDIAC CATHETERIZATION  2003  . CATARACT EXTRACTION W/ INTRAOCULAR LENS  IMPLANT, BILATERAL Bilateral   . CORONARY ANGIOPLASTY WITH STENT PLACEMENT  08/13/2017  . CORONARY STENT INTERVENTION N/A 08/13/2017   Procedure: CORONARY STENT INTERVENTION;  Surgeon: Burnell Blanks, MD;  Location: [place] [facility];  Service: Cardiovascular;  Laterality: N/A;  . EXCISIONAL HEMORRHOIDECTOMY    . HERNIA REPAIR    . LEFT HEART CATH AND CORONARY ANGIOGRAPHY N/A 08/13/2017   Procedure: LEFT HEART CATH AND CORONARY ANGIOGRAPHY;  Surgeon: Burnell Blanks, MD;  Location: [place] [facility];   Service: Cardiovascular;  Laterality: N/A;  . SHOULDER ARTHROSCOPY WITH ROTATOR CUFF REPAIR Right 10/30/2014   Procedure: SHOULDER ARTHROSCOPY WITH ROTATOR CUFF REPAIR;  Surgeon: Tania Ade, MD;  Location: [place];  Service: Orthopedics;  Laterality: Right;  . SHOULDER ARTHROSCOPY WITH SUBACROMIAL DECOMPRESSION Right 10/30/2014   Procedure: Right shoulder arthroscopy rotator cuff repair, subacromial decompression;  Surgeon: Tania Ade, MD;  Location: [place];  Service: Orthopedics;  Laterality: Right;  Right shoulder arthrscopy rotator cuff repair, subacromial decompression   Social History: Social History   Socioeconomic History  . Marital status: Married    Spouse name: Not on file  . Number of children: Not on file  . Years of education: Not on file  . Highest education level: Not on file  Occupational History  . Not on file  Social Needs  . Financial resource strain: Not on file  . Food insecurity:    Worry: Not on file    Inability: Not on file  . Transportation needs:    Medical: Not on file    Non-medical: Not on file  Tobacco Use  . Smoking status: Former Smoker    Years: 27.00    Types: Cigarettes    Last attempt to quit: 03/25/1999    Years since quitting: 18.6  . Smokeless tobacco: Never Used  Substance and Sexual Activity  . Alcohol use: No  . Drug use: No  . Sexual activity: Not Currently  Lifestyle  . Physical activity:    Days per  week: Not on file    Minutes per session: Not on file  . Stress: Not on file  Relationships  . Social connections:    Talks on phone: Not on file    Gets together: Not on file    Attends religious service: Not on file    Active member of club or organization: Not on file    Attends meetings of clubs or organizations: Not on file    Relationship status: Not on file  Other Topics Concern  . Not on file  Social History Narrative  . Not on file   Family History: Family History    Problem Relation Age of Onset  . Alcohol abuse Mother   . Arrhythmia Mother   . Colon cancer Father   . Alcohol abuse Father   . Stroke Maternal Grandfather    Allergies: Allergies  Allergen Reactions  . Feldene [Piroxicam] Hives   Medications: See med rec.  Review of Systems: No fevers, chills, night sweats, weight loss, chest pain, or shortness of breath.   Objective:    General: Well Developed, well nourished, and in no acute distress.  Neuro: Alert and oriented x3, extra-ocular muscles intact, sensation grossly intact.  HEENT: Normocephalic, atraumatic, pupils equal round reactive to light, neck supple, no masses, no lymphadenopathy, thyroid nonpalpable.  Skin: Warm and dry, no rashes. Cardiac: Regular rate and rhythm, no murmurs rubs or gallops, no lower extremity edema.  Respiratory: Clear to auscultation bilaterally. Not using accessory muscles, speaking in full sentences. Left knee: Visibly swollen prepatellar bursa without tenderness or erythema. ROM normal in flexion and extension and lower leg rotation. Ligaments with solid consistent endpoints including ACL, PCL, LCL, MCL. Negative Mcmurray's and provocative meniscal tests. Non painful patellar compression. Patellar and quadriceps tendons unremarkable. Hamstring and quadriceps strength is normal.  Procedure: Real-time Ultrasound Guided aspiration/injection of left prepatellar bursa Device: GE Logiq E  Verbal informed consent obtained.  Time-out conducted.  Noted no overlying erythema, induration, or other signs of local infection.  Skin prepped in a sterile fashion.  Local anesthesia: Topical Ethyl chloride.  With sterile technique and under real time ultrasound guidance: Using an 18-gauge needle advanced into the prepatellar bursa, aspirated 8 mL of frank blood, syringe switched and 1 cc Kenalog 40, 1 cc lidocaine injected easily Completed without difficulty  Pain immediately resolved suggesting accurate  placement of the medication.  Advised to call if fevers/chills, erythema, induration, drainage, or persistent bleeding.  Images permanently stored and available for review in the ultrasound unit.  Impression: Technically successful ultrasound guided injection.  The knee was then strapped with a compressive dressing  Impression and Recommendations:    Hemorrhagic prepatellar bursitis of left knee Currently on Xarelto and Plavix. Aspiration and injection, strapped with compressive dressing. Return in 1 month. ___________________________________________ Gwen Her. Dianah Field, M.D., ABFM., CAQSM. Primary Care and [place] Instructor of [place] [employer] [facility] [employer]

## 2017-11-24 ENCOUNTER — Ambulatory Visit (HOSPITAL_COMMUNITY): Payer: Medicare Other

## 2017-11-27 ENCOUNTER — Encounter: Payer: Self-pay | Admitting: Physician Assistant

## 2017-11-27 ENCOUNTER — Ambulatory Visit (INDEPENDENT_AMBULATORY_CARE_PROVIDER_SITE_OTHER): Payer: Medicare Other | Admitting: Physician Assistant

## 2017-11-27 VITALS — BP 113/79 | HR 57 | Ht 70.0 in | Wt 213.0 lb

## 2017-11-27 DIAGNOSIS — Z23 Encounter for immunization: Secondary | ICD-10-CM | POA: Diagnosis not present

## 2017-11-27 DIAGNOSIS — Z0000 Encounter for general adult medical examination without abnormal findings: Secondary | ICD-10-CM | POA: Diagnosis not present

## 2017-11-27 DIAGNOSIS — Z1159 Encounter for screening for other viral diseases: Secondary | ICD-10-CM | POA: Diagnosis not present

## 2017-11-27 DIAGNOSIS — I2510 Atherosclerotic heart disease of native coronary artery without angina pectoris: Secondary | ICD-10-CM

## 2017-11-27 DIAGNOSIS — Z9861 Coronary angioplasty status: Secondary | ICD-10-CM

## 2017-11-27 NOTE — Patient Instructions (Signed)

## 2017-11-28 ENCOUNTER — Encounter: Payer: Self-pay | Admitting: Physician Assistant

## 2017-11-28 LAB — HEPATITIS C ANTIBODY
HEP C AB: NONREACTIVE
SIGNAL TO CUT-OFF: 0.04 (ref <1.00)

## 2017-11-28 LAB — LIPID PANEL W/REFLEX DIRECT LDL
Cholesterol: 98 mg/dL (ref <200)
HDL: 43 mg/dL (ref >40)
LDL CHOLESTEROL (CALC): 44 mg/dL
Non-HDL Cholesterol (Calc): 55 mg/dL (calc) (ref <130)
TRIGLYCERIDES: 39 mg/dL (ref <150)
Total CHOL/HDL Ratio: 2.3 (calc) (ref <5.0)

## 2017-11-28 MED ORDER — AMBULATORY NON FORMULARY MEDICATION: 0 refills | Status: DC

## 2017-11-28 NOTE — Progress Notes (Signed)
Subjective:   Stephen King is a 67 y.o. male who presents for Medicare Annual/Subsequent preventive examination.  Review of Systems:  No problems to report.        Objective:    Vitals: BP 113/79   Pulse (!) 57   Ht 5\' 10"  (1.778 m)   Wt 213 lb (96.6 kg)   SpO2 95%   BMI 30.56 kg/m   Body mass index is 30.56 kg/m.  Advanced Directives 10/27/2017 09/17/2017 08/13/2017 08/13/2017 10/30/2014 10/24/2014 08/26/2013  Does Patient Have a Medical Advance Directive? Yes Yes Yes Yes Yes Yes Patient has advance directive, copy not in chart  Type of Advance Directive Living will;Healthcare Power of [place];Living will [place];Living will [place];Living will Healthcare Power of [place];Living will [place];Living will  Does patient want to make changes to medical advance directive? No - Patient declined No - Patient declined - No - Patient declined No - Patient declined - -  Copy of [place] in Chart? No - copy requested No - copy requested No - copy requested No - copy requested No - copy requested - -  Pre-existing out of facility DNR order (yellow form or pink MOST form) - - - - - - -    Tobacco Social History   Tobacco Use  Smoking Status Former Smoker  . Years: 27.00  . Types: Cigarettes  . Last attempt to quit: 03/25/1999  . Years since quitting: 18.6  Smokeless Tobacco Never Used     Counseling given: Not Answered   Clinical Intake:                       Past Medical History:  Diagnosis Date  . Arthritis    "?right knee" (08/13/2017)  . Dysrhythmia    PAF  . Hernia, umbilical   . History of hiatal hernia   . History of kidney stones   . Stroke [facility]) 12/2009   "has to watch where he's walking since; more of a spacial thing resulting from stroke" (08/13/2017)   Past Surgical History:  Procedure Laterality Date  . ABDOMINAL  HERNIA REPAIR     "has a mesh in his abdomen"  . ANTERIOR CERVICAL DECOMP/DISCECTOMY FUSION  10/2005   C6-7  . ATRIAL FIBRILLATION ABLATION N/A 09/17/2017   Procedure: ATRIAL FIBRILLATION ABLATION;  Surgeon: Constance Haw, MD;  Location: [place] [facility];  Service: Cardiovascular;  Laterality: N/A;  . BACK SURGERY    . CARDIAC CATHETERIZATION  2003  . CATARACT EXTRACTION W/ INTRAOCULAR LENS  IMPLANT, BILATERAL Bilateral   . CORONARY ANGIOPLASTY WITH STENT PLACEMENT  08/13/2017  . CORONARY STENT INTERVENTION N/A 08/13/2017   Procedure: CORONARY STENT INTERVENTION;  Surgeon: Burnell Blanks, MD;  Location: [place] [facility];  Service: Cardiovascular;  Laterality: N/A;  . EXCISIONAL HEMORRHOIDECTOMY    . HERNIA REPAIR    . LEFT HEART CATH AND CORONARY ANGIOGRAPHY N/A 08/13/2017   Procedure: LEFT HEART CATH AND CORONARY ANGIOGRAPHY;  Surgeon: Burnell Blanks, MD;  Location: [place] [facility];  Service: Cardiovascular;  Laterality: N/A;  . SHOULDER ARTHROSCOPY WITH ROTATOR CUFF REPAIR Right 10/30/2014   Procedure: SHOULDER ARTHROSCOPY WITH ROTATOR CUFF REPAIR;  Surgeon: Tania Ade, MD;  Location: [place];  Service: Orthopedics;  Laterality: Right;  . SHOULDER ARTHROSCOPY WITH SUBACROMIAL DECOMPRESSION Right 10/30/2014   Procedure: Right shoulder arthroscopy  rotator cuff repair, subacromial decompression;  Surgeon: Tania Ade, MD;  Location: [place];  Service: Orthopedics;  Laterality: Right;  Right shoulder arthrscopy rotator cuff repair, subacromial decompression   Family History  Problem Relation Age of Onset  . Alcohol abuse Mother   . Arrhythmia Mother   . Colon cancer Father   . Alcohol abuse Father   . Stroke Maternal Grandfather    Social History   Socioeconomic History  . Marital status: Married    Spouse name: Not on file  . Number of children: Not on file  . Years of education: Not on file  . Highest education  level: Not on file  Occupational History  . Not on file  Social Needs  . Financial resource strain: Not on file  . Food insecurity:    Worry: Not on file    Inability: Not on file  . Transportation needs:    Medical: Not on file    Non-medical: Not on file  Tobacco Use  . Smoking status: Former Smoker    Years: 27.00    Types: Cigarettes    Last attempt to quit: 03/25/1999    Years since quitting: 18.6  . Smokeless tobacco: Never Used  Substance and Sexual Activity  . Alcohol use: No  . Drug use: No  . Sexual activity: Not Currently  Lifestyle  . Physical activity:    Days per week: Not on file    Minutes per session: Not on file  . Stress: Not on file  Relationships  . Social connections:    Talks on phone: Not on file    Gets together: Not on file    Attends religious service: Not on file    Active member of club or organization: Not on file    Attends meetings of clubs or organizations: Not on file    Relationship status: Not on file  Other Topics Concern  . Not on file  Social History Narrative  . Not on file    Outpatient Encounter Medications as of 11/27/2017  Medication Sig  . aspirin 81 MG chewable tablet Chew 1 tablet (81 mg total) by mouth daily.  Marland Kitchen atorvastatin (LIPITOR) 80 MG tablet Take 1 tablet (80 mg total) by mouth daily at 6 PM.  . clopidogrel (PLAVIX) 75 MG tablet Take 1 tablet (75 mg total) by mouth daily with breakfast.  . metoprolol tartrate (LOPRESSOR) 25 MG tablet Take 0.5 tablets (12.5 mg total) by mouth 2 (two) times daily.  . nitroGLYCERIN (NITROSTAT) 0.4 MG SL tablet Place 0.4 mg under the tongue every 5 (five) minutes x 3 doses as needed for chest pain.  . rivaroxaban (XARELTO) 20 MG TABS tablet TAKE 1 TABLET BY MOUTH  DAILY WITH SUPPER  . [DISCONTINUED] amiodarone (PACERONE) 200 MG tablet Take 1 tablet (200 mg total) by mouth daily.   No facility-administered encounter medications on file as of 11/27/2017.     Activities of Daily Living In  your present state of health, do you have any difficulty performing the following activities: 11/28/2017 08/13/2017  Hearing? N -  Vision? N -  Difficulty concentrating or making decisions? N -  Walking or climbing stairs? N -  Dressing or bathing? N -  Doing errands, shopping? N N  Some recent data might be hidden    Patient Care Team: Lavada Mesi as PCP - General (Family Medicine) Jerline Pain, MD as PCP - Cardiology (Cardiology)   Assessment:   This is  a routine wellness examination for [place].  Exercise Activities and Dietary recommendations    Goals   None     Fall Risk Fall Risk  11/27/2017 11/27/2017  Falls in the past year? No No   Is the patient's home free of loose throw rugs in walkways, pet beds, electrical cords, etc?   no      Grab bars in the bathroom? no      Handrails on the stairs?   yes      Adequate lighting?   yes  Timed Get Up and Go Performed:   Depression Screen PHQ 2/9 Scores 11/27/2017 07/28/2017  PHQ - 2 Score 1 2  PHQ- 9 Score - 9    Cognitive Function     6CIT Screen 11/27/2017  What Year? 0 points  What month? 0 points  What time? 0 points  Count back from 20 0 points  Months in reverse 0 points  Repeat phrase 0 points  Total Score 0    Immunization History  Administered Date(s) Administered  . Influenza Split 02/04/2012  . Influenza,inj,Quad PF,6+ Mos 03/07/2016, 11/17/2017  . Pneumococcal Polysaccharide-23 11/27/2017  . Tdap 03/26/2009    Qualifies for Shingles Vaccine? Yes  Screening Tests Health Maintenance  Topic Date Due  . Hepatitis C Screening  04-Feb-1951  . PNA vac Low Risk Adult (2 of 2 - PCV13) 11/28/2018  . TETANUS/TDAP  05/06/2019  . COLONOSCOPY  04/29/2021  . INFLUENZA VACCINE  Completed   Cancer Screenings: Lung: Low Dose CT Chest recommended if Age 14-80 years, 30 pack-year currently smoking OR have quit w/in 15years. Patient does not qualify. Colorectal: up to date.   Additional Screenings:  ordered today.  Hepatitis C Screening:      Plan:     I have personally reviewed and noted the following in the patient's chart:   . Medical and social history . Use of alcohol, tobacco or illicit drugs  . Current medications and supplements . Functional ability and status . Nutritional status . Physical activity . Advanced directives . List of other physicians . Hospitalizations, surgeries, and ER visits in previous 12 months . Vitals . Screenings to include cognitive, depression, and falls . Referrals and appointments  In addition, I have reviewed and discussed with patient certain preventive protocols, quality metrics, and best practice recommendations. A written personalized care plan for preventive services as well as general preventive health recommendations were provided to patient.   Pneumonia 23 given today.  Shingles discussed ordered printed.  Lipid panel ordered today.   Iran Planas, PA-C  11/27/2017

## 2017-11-29 NOTE — Progress Notes (Signed)
Call pt: labs are perfect.

## 2017-11-30 ENCOUNTER — Ambulatory Visit (HOSPITAL_COMMUNITY): Payer: Medicare Other

## 2017-12-01 ENCOUNTER — Telehealth: Payer: Self-pay | Admitting: Physician Assistant

## 2017-12-01 NOTE — Telephone Encounter (Signed)
Called patient and advised him that I would fax an order for the Shingles vaccine so he could stop by the pharmacy and get it completed. Patient did not have any further questions.

## 2017-12-02 ENCOUNTER — Ambulatory Visit (HOSPITAL_COMMUNITY): Payer: Medicare Other

## 2017-12-04 ENCOUNTER — Ambulatory Visit (HOSPITAL_COMMUNITY): Payer: Medicare Other

## 2017-12-07 ENCOUNTER — Ambulatory Visit (HOSPITAL_COMMUNITY): Payer: Medicare Other

## 2017-12-09 ENCOUNTER — Ambulatory Visit (HOSPITAL_COMMUNITY): Payer: Medicare Other

## 2017-12-11 ENCOUNTER — Ambulatory Visit (HOSPITAL_COMMUNITY): Payer: Medicare Other

## 2017-12-12 ENCOUNTER — Encounter (HOSPITAL_BASED_OUTPATIENT_CLINIC_OR_DEPARTMENT_OTHER): Payer: Medicare Other

## 2017-12-14 ENCOUNTER — Ambulatory Visit (HOSPITAL_BASED_OUTPATIENT_CLINIC_OR_DEPARTMENT_OTHER): Payer: Medicare Other | Attending: Cardiology | Admitting: Cardiology

## 2017-12-14 ENCOUNTER — Ambulatory Visit (HOSPITAL_COMMUNITY): Payer: Medicare Other

## 2017-12-14 VITALS — Ht 70.0 in | Wt 208.0 lb

## 2017-12-14 DIAGNOSIS — Z9989 Dependence on other enabling machines and devices: Secondary | ICD-10-CM

## 2017-12-14 DIAGNOSIS — I493 Ventricular premature depolarization: Secondary | ICD-10-CM | POA: Insufficient documentation

## 2017-12-14 DIAGNOSIS — Z79899 Other long term (current) drug therapy: Secondary | ICD-10-CM | POA: Insufficient documentation

## 2017-12-14 DIAGNOSIS — R0683 Snoring: Secondary | ICD-10-CM | POA: Insufficient documentation

## 2017-12-14 DIAGNOSIS — R400 Somnolence: Secondary | ICD-10-CM

## 2017-12-14 DIAGNOSIS — G4733 Obstructive sleep apnea (adult) (pediatric): Secondary | ICD-10-CM

## 2017-12-16 ENCOUNTER — Ambulatory Visit: Payer: Medicare Other | Admitting: Cardiology

## 2017-12-16 ENCOUNTER — Ambulatory Visit (HOSPITAL_COMMUNITY): Payer: Medicare Other

## 2017-12-16 ENCOUNTER — Encounter: Payer: Self-pay | Admitting: Cardiology

## 2017-12-16 VITALS — BP 120/60 | HR 59 | Ht 70.0 in | Wt 216.0 lb

## 2017-12-16 DIAGNOSIS — G4733 Obstructive sleep apnea (adult) (pediatric): Secondary | ICD-10-CM | POA: Diagnosis not present

## 2017-12-16 DIAGNOSIS — I2510 Atherosclerotic heart disease of native coronary artery without angina pectoris: Secondary | ICD-10-CM | POA: Diagnosis not present

## 2017-12-16 DIAGNOSIS — I480 Paroxysmal atrial fibrillation: Secondary | ICD-10-CM | POA: Diagnosis not present

## 2017-12-16 NOTE — Patient Instructions (Signed)
Medication Instructions:  Your physician has recommended you make the following change in your medication:  1. STOP Aspirin  * If you need a refill on your cardiac medications before your next appointment, please call your pharmacy.   Labwork: None ordered  Testing/Procedures: None ordered  Follow-Up: Your physician recommends that you schedule a follow-up appointment in: 3 months with Dr. Curt Bears.   *Please note that any paperwork needing to be filled out by the provider will need to be addressed at the front desk prior to seeing the provider. Please note that any FMLA, disability or other documents regarding health condition is subject to a $25.00 charge that must be received prior to completion of paperwork in the form of a money order or check.  Thank you for choosing [facility]!!   Trinidad Curet, RN [phone]

## 2017-12-16 NOTE — Progress Notes (Signed)
Electrophysiology Office Note   Date:  12/17/2017   ID:  Stephen King, DOB [birth year], MRN [id number]  PCP:  Donella Stade PA-C  Cardiologist:  Marlou Porch Primary Electrophysiologist:  Constance Haw, MD    No chief complaint on file.    History of Present Illness: Stephen King is a 67 y.o. male who is being seen today for the evaluation of atrial fibrillation at the request of mark Skains. Presenting today for electrophysiology evaluation.  He has a history of stroke in 2011, paroxysmal atrial fibrillation on Xarelto.  He also has a history of coronary artery disease status post circumflex stent 08/14/2017.  Is currently on aspirin, Plavix, Xarelto.  He has had more frequent episodes of atrial fibrillation with episodes October 2018, April 2019, and May 2019.  On 08/15/2017, he presented to [facility] for A. fib with RVR.  Heart rate was in the 170s at this time.  Cardioversion was attempted but was unsuccessful x2.  He was placed on amiodarone and transferred to [place], he was started on amiodarone 400 mg daily.  He had an ablation for atrial fibrillation 09/17/2017.  Today, denies symptoms of palpitations, chest pain, shortness of breath, orthopnea, PND, lower extremity edema, claudication, dizziness, presyncope, syncope, bleeding, or neurologic sequela. The patient is tolerating medications without difficulties.  Overall he is feeling well.  He is noted no further episodes of atrial fibrillation.  He is able to do all of his daily activities.  He no longer has rapid heart rates.   Past Medical History:  Diagnosis Date  . Arthritis    "?right knee" (08/13/2017)  . Dysrhythmia    PAF  . Hernia, umbilical   . History of hiatal hernia   . History of kidney stones   . Stroke [facility]) 12/2009   "has to watch where he's walking since; more of a spacial thing resulting from stroke" (08/13/2017)   Past Surgical History:  Procedure Laterality Date  .  ABDOMINAL HERNIA REPAIR     "has a mesh in his abdomen"  . ANTERIOR CERVICAL DECOMP/DISCECTOMY FUSION  10/2005   C6-7  . ATRIAL FIBRILLATION ABLATION N/A 09/17/2017   Procedure: ATRIAL FIBRILLATION ABLATION;  Surgeon: Constance Haw, MD;  Location: [place] [facility];  Service: Cardiovascular;  Laterality: N/A;  . BACK SURGERY    . CARDIAC CATHETERIZATION  2003  . CATARACT EXTRACTION W/ INTRAOCULAR LENS  IMPLANT, BILATERAL Bilateral   . CORONARY ANGIOPLASTY WITH STENT PLACEMENT  08/13/2017  . CORONARY STENT INTERVENTION N/A 08/13/2017   Procedure: CORONARY STENT INTERVENTION;  Surgeon: Burnell Blanks, MD;  Location: [place] [facility];  Service: Cardiovascular;  Laterality: N/A;  . EXCISIONAL HEMORRHOIDECTOMY    . HERNIA REPAIR    . LEFT HEART CATH AND CORONARY ANGIOGRAPHY N/A 08/13/2017   Procedure: LEFT HEART CATH AND CORONARY ANGIOGRAPHY;  Surgeon: Burnell Blanks, MD;  Location: [place] [facility];  Service: Cardiovascular;  Laterality: N/A;  . SHOULDER ARTHROSCOPY WITH ROTATOR CUFF REPAIR Right 10/30/2014   Procedure: SHOULDER ARTHROSCOPY WITH ROTATOR CUFF REPAIR;  Surgeon: Tania Ade, MD;  Location: [place];  Service: Orthopedics;  Laterality: Right;  . SHOULDER ARTHROSCOPY WITH SUBACROMIAL DECOMPRESSION Right 10/30/2014   Procedure: Right shoulder arthroscopy rotator cuff repair, subacromial decompression;  Surgeon: Tania Ade, MD;  Location: [place];  Service: Orthopedics;  Laterality: Right;  Right shoulder arthrscopy rotator cuff repair, subacromial decompression  Current Outpatient Medications  Medication Sig Dispense Refill  . AMBULATORY NON FORMULARY MEDICATION shingrx 2 doses to prevent shingles. 2 application 0  . atorvastatin (LIPITOR) 80 MG tablet Take 1 tablet (80 mg total) by mouth daily at 6 PM. 90 tablet 3  . clopidogrel (PLAVIX) 75 MG tablet Take 1 tablet (75 mg total) by mouth daily with breakfast. 90  tablet 3  . metoprolol tartrate (LOPRESSOR) 25 MG tablet Take 0.5 tablets (12.5 mg total) by mouth 2 (two) times daily. 90 tablet 3  . nitroGLYCERIN (NITROSTAT) 0.4 MG SL tablet Place 0.4 mg under the tongue every 5 (five) minutes x 3 doses as needed for chest pain.    . rivaroxaban (XARELTO) 20 MG TABS tablet TAKE 1 TABLET BY MOUTH  DAILY WITH SUPPER 90 tablet 1   No current facility-administered medications for this visit.     Allergies:   Feldene [piroxicam]   Social History:  The patient  reports that he quit smoking about 18 years ago. His smoking use included cigarettes. He quit after 27.00 years of use. He has never used smokeless tobacco. He reports that he does not drink alcohol or use drugs.   Family History:  The patient's family history includes Alcohol abuse in his father and mother; Arrhythmia in his mother; Colon cancer in his father; Stroke in his maternal grandfather.    ROS:  Please see the history of present illness.   Otherwise, review of systems is positive for shortness of breath, easy bruising, headaches, bleeding.   All other systems are reviewed and negative.   PHYSICAL EXAM: VS:  BP 120/60   Pulse (!) 59   Ht 5\' 10"  (1.778 m)   Wt 216 lb (98 kg)   BMI 30.99 kg/m  , BMI Body mass index is 30.99 kg/m. GEN: Well nourished, well developed, in no acute distress  HEENT: normal  Neck: no JVD, carotid bruits, or masses Cardiac: RRR; no murmurs, rubs, or gallops,no edema  Respiratory:  clear to auscultation bilaterally, normal work of breathing GI: soft, nontender, nondistended, + BS MS: no deformity or atrophy  Skin: warm and dry Neuro:  Strength and sensation are intact Psych: euthymic mood, full affect  EKG:  EKG is ordered today. Personal review of the ekg ordered shows sinus rhythm, rate 59  Recent Labs: 08/18/2017: BUN 11; Creatinine, Ser 1.10; Hemoglobin 15.4; Platelets 315; Potassium 4.5; Sodium 141    Lipid Panel     Component Value Date/Time    CHOL 98 11/27/2017 1127   TRIG 39 11/27/2017 1127   HDL 43 11/27/2017 1127   CHOLHDL 2.3 11/27/2017 1127   VLDL 8 11/02/2015 0804   LDLCALC 44 11/27/2017 1127     Wt Readings from Last 3 Encounters:  12/16/17 216 lb (98 kg)  12/14/17 208 lb (94.3 kg)  11/27/17 213 lb (96.6 kg)      Other studies Reviewed: Additional studies/ records that were reviewed today include: LHC 08/13/17  Review of the above records today demonstrates:   Prox RCA lesion is 20% stenosed.  Mid RCA lesion is 30% stenosed.  Prox Cx lesion is 80% stenosed.  A drug-eluting stent was successfully placed using a STENT RESOLUTE ONYX 4.0X18.  Post intervention, there is a 0% residual stenosis.  The left ventricular systolic function is normal.  LV end diastolic pressure is normal.  The left ventricular ejection fraction is greater than 65% by visual estimate.  There is no mitral valve regurgitation.   1. Severe stenosis  proximal Circumflex artery 2. Successful PTCA/DES x 1 proximal Circumflex but unfortunately a very small caliber obtuse marginal branch was jailed by the stent and flow was lost down this branch. The branch was 0.5-0.75 mm and too small for a balloon or stent. He had severe chest pain post stent deployment felt to be due to the loss of flow down this small branch.  3. Mild non-obstructive plaque in the RCA 4. Normal LV systolic function  TTE 7425 - Left ventricle: The cavity size was normal. There was mild concentric hypertrophy. Systolic function was normal. The estimated ejection fraction was in the range of 60% to 65%. Wall motion was normal; there were no regional wall motion abnormalities. - Mitral valve: There was systolic anterior motion. There was mild regurgitation. - Right ventricle: The cavity size was mildly dilated. Wall thickness was normal. - Right atrium: The atrium was mildly dilated.  ASSESSMENT AND PLAN:  1.  Paroxysmal atrial fibrillation:  Currently on amiodarone and Xarelto.  Status post ablation 09/17/2017.  No recurrences thus far.  No changes.  This patients CHA2DS2-VASc Score and unadjusted Ischemic Stroke Rate (% per year) is equal to 2.2 % stroke rate/year from a score of 2  Above score calculated as 1 point each if present [CHF, HTN, DM, Vascular=MI/PAD/Aortic Plaque, Age if 65-74, or Male] Above score calculated as 2 points each if present [Age > 75, or Stroke/TIA/TE]  2.  Coronary artery disease: Status post circumflex stent 08/15/2017.  Currently on aspirin and Plavix.  Due to being on Xarelto, Loreda Silverio plan to stop aspirin today.    3.  Obesity: Weight loss encouraged.    4.  Obstructive sleep apnea: Currently undergoing CPAP titration  Current medicines are reviewed at length with the patient today.   The patient does not have concerns regarding his medicines.  The following changes were made today:  Stop aspirin  Labs/ tests ordered today include:  Orders Placed This Encounter  Procedures  . EKG 12-Lead    Disposition:   FU with Ilai Hiller 3 months  Signed, Gaylynn Seiple Meredith Leeds, MD  12/17/2017 7:07 AM     [facility] [address] [place] [address] [phone] (office) [phone] (fax)

## 2017-12-17 NOTE — Procedures (Signed)
   Patient Name: Stephen King, Stephen King Date: 12/14/2017   Gender: Male  D.O.B: Jul 31, 1950  Age (years): 50  Referring Provider: Fransico Him MD, ABSM  Height (inches): 70  Interpreting Physician: Fransico Him MD, ABSM  Weight (lbs): 208  RPSGT: Carolin Coy  BMI: 30  MRN: [id number]  Neck Size: 16.00   CLINICAL INFORMATION  The patient is referred for a CPAP titration to treat sleep apnea.  SLEEP STUDY TECHNIQUE  As per the AASM Manual for the Scoring of Sleep and Associated Events v2.3 (April 2016) with a hypopnea requiring 4% desaturations. The channels recorded and monitored were frontal, central and occipital EEG, electrooculogram (EOG), submentalis EMG (chin), nasal and oral airflow, thoracic and abdominal wall motion, anterior tibialis EMG, snore microphone, electrocardiogram, and pulse oximetry. Continuous positive airway pressure (CPAP) was initiated at the beginning of the study and titrated to treat sleep-disordered breathing.  MEDICATIONS  Medications self-administered by patient taken the night of the study : NON ASPIRIN PM ACETAMINOPHEN  TECHNICIAN COMMENTS  Comments added by technician: PATIENT WAS ORDERED AS A CPAP TITRATION. Comments added by scorer: N/A   RESPIRATORY PARAMETERS  Optimal PAP Pressure (cm):  15  AHI at Optimal Pressure (/hr): 3.2   Overall Minimal O2 (%): 83.0 Supine % at Optimal Pressure (%): 91   Minimal O2 at Optimal Pressure (%): 93.0       SLEEP ARCHITECTURE  The study was initiated at 10:06:28 PM and ended at 4:55:55 AM. Sleep onset time was 6.4 minutes and the sleep efficiency was 75.1%%. The total sleep time was 307.6 minutes. The patient spent 20.3%% of the night in stage N1 sleep, 68.6%% in stage N2 sleep, 0.0%% in stage N3 and 11.1% in REM.Stage REM latency was 238.0 minutes Wake after sleep onset was 95.5. Alpha intrusion was absent. Supine sleep was 51.04%.  CARDIAC DATA  The 2 lead EKG demonstrated sinus rhythm. The mean  heart rate was 54.8 beats per minute. Other EKG findings include: PVCs.   LEG MOVEMENT DATA  The total Periodic Limb Movements of Sleep (PLMS) were 0. The PLMS index was 0.0. A PLMS index of <15 is considered normal in adults.  IMPRESSIONS  The optimal PAP pressure was 15 cm of water.   Central sleep apnea was not noted during this titration (CAI = 2.0/h).  Moderate oxygen desaturations were observed during this titration (min O2 = 83.0%).  The patient snored with moderate snoring volume during this titration study.  2-lead EKG demonstrated: PVCs  Clinically significant periodic limb movements were not noted during this study. Arousals associated with PLMs were rare.  DIAGNOSIS  Obstructive Sleep Apnea (327.23 [G47.33 ICD-10])  RECOMMENDATIONS  Trial of CPAP therapy on 15 cm H2O with a Medium size Fisher&Paykel Full Face Mask Simplus mask and heated humidification. Avoid alcohol, sedatives and other CNS depressants that may worsen sleep apnea and disrupt normal sleep architecture.  Sleep hygiene should be reviewed to assess factors that may improve sleep quality.  Weight management and regular exercise should be initiated or continued.  Return to [facility] Center for re-evaluation after 10 weeks of therapy  [Electronically signed] 12/17/2017 04:04 PM Fransico Him MD, ABSM  Diplomate, American Board of Sleep Medicine

## 2017-12-18 ENCOUNTER — Ambulatory Visit (HOSPITAL_COMMUNITY): Payer: Medicare Other

## 2017-12-18 ENCOUNTER — Ambulatory Visit (INDEPENDENT_AMBULATORY_CARE_PROVIDER_SITE_OTHER): Payer: Medicare Other | Admitting: Sports Medicine

## 2017-12-18 ENCOUNTER — Encounter: Payer: Self-pay | Admitting: Sports Medicine

## 2017-12-18 DIAGNOSIS — M7042 Prepatellar bursitis, left knee: Secondary | ICD-10-CM

## 2017-12-18 NOTE — Assessment & Plan Note (Addendum)
Completely resolved after aspiration and injection at the last visit.   Return as needed.

## 2017-12-18 NOTE — Progress Notes (Signed)
Subjective:    CC: Recheck knee  HPI: Stephen King is a pleasant 67 year old male, he had a hemorrhagic prepatellar bursitis, we aspirated, injected and compressed it last month.  He returns today symptoms completely resolved.  I reviewed the past medical history, family history, social history, surgical history, and allergies today and no changes were needed.  Please see the problem list section below in epic for further details.  Past Medical History: Past Medical History:  Diagnosis Date  . Arthritis    "?right knee" (08/13/2017)  . Dysrhythmia    PAF  . Hernia, umbilical   . History of hiatal hernia   . History of kidney stones   . Stroke [facility]) 12/2009   "has to watch where he's walking since; more of a spacial thing resulting from stroke" (08/13/2017)   Past Surgical History: Past Surgical History:  Procedure Laterality Date  . ABDOMINAL HERNIA REPAIR     "has a mesh in his abdomen"  . ANTERIOR CERVICAL DECOMP/DISCECTOMY FUSION  10/2005   C6-7  . ATRIAL FIBRILLATION ABLATION N/A 09/17/2017   Procedure: ATRIAL FIBRILLATION ABLATION;  Surgeon: Constance Haw, MD;  Location: [place] [facility];  Service: Cardiovascular;  Laterality: N/A;  . BACK SURGERY    . CARDIAC CATHETERIZATION  2003  . CATARACT EXTRACTION W/ INTRAOCULAR LENS  IMPLANT, BILATERAL Bilateral   . CORONARY ANGIOPLASTY WITH STENT PLACEMENT  08/13/2017  . CORONARY STENT INTERVENTION N/A 08/13/2017   Procedure: CORONARY STENT INTERVENTION;  Surgeon: Burnell Blanks, MD;  Location: [place] [facility];  Service: Cardiovascular;  Laterality: N/A;  . EXCISIONAL HEMORRHOIDECTOMY    . HERNIA REPAIR    . LEFT HEART CATH AND CORONARY ANGIOGRAPHY N/A 08/13/2017   Procedure: LEFT HEART CATH AND CORONARY ANGIOGRAPHY;  Surgeon: Burnell Blanks, MD;  Location: [place] [facility];  Service: Cardiovascular;  Laterality: N/A;  . SHOULDER ARTHROSCOPY WITH ROTATOR CUFF REPAIR Right 10/30/2014   Procedure: SHOULDER  ARTHROSCOPY WITH ROTATOR CUFF REPAIR;  Surgeon: Tania Ade, MD;  Location: [place];  Service: Orthopedics;  Laterality: Right;  . SHOULDER ARTHROSCOPY WITH SUBACROMIAL DECOMPRESSION Right 10/30/2014   Procedure: Right shoulder arthroscopy rotator cuff repair, subacromial decompression;  Surgeon: Tania Ade, MD;  Location: [place];  Service: Orthopedics;  Laterality: Right;  Right shoulder arthrscopy rotator cuff repair, subacromial decompression   Social History: Social History   Socioeconomic History  . Marital status: Married    Spouse name: Not on file  . Number of children: Not on file  . Years of education: Not on file  . Highest education level: Not on file  Occupational History  . Not on file  Social Needs  . Financial resource strain: Not on file  . Food insecurity:    Worry: Not on file    Inability: Not on file  . Transportation needs:    Medical: Not on file    Non-medical: Not on file  Tobacco Use  . Smoking status: Former Smoker    Years: 27.00    Types: Cigarettes    Last attempt to quit: 03/25/1999    Years since quitting: 18.7  . Smokeless tobacco: Never Used  Substance and Sexual Activity  . Alcohol use: No  . Drug use: No  . Sexual activity: Not Currently  Lifestyle  . Physical activity:    Days per week: Not on file    Minutes per session: Not on file  . Stress: Not on file  Relationships  .  Social connections:    Talks on phone: Not on file    Gets together: Not on file    Attends religious service: Not on file    Active member of club or organization: Not on file    Attends meetings of clubs or organizations: Not on file    Relationship status: Not on file  Other Topics Concern  . Not on file  Social History Narrative  . Not on file   Family History: Family History  Problem Relation Age of Onset  . Alcohol abuse Mother   . Arrhythmia Mother   . Colon cancer Father   . Alcohol abuse Father   .  Stroke Maternal Grandfather    Allergies: Allergies  Allergen Reactions  . Feldene [Piroxicam] Hives   Medications: See med rec.  Review of Systems: No fevers, chills, night sweats, weight loss, chest pain, or shortness of breath.   Objective:    General: Well Developed, well nourished, and in no acute distress.  Neuro: Alert and oriented x3, extra-ocular muscles intact, sensation grossly intact.  HEENT: Normocephalic, atraumatic, pupils equal round reactive to light, neck supple, no masses, no lymphadenopathy, thyroid nonpalpable.  Skin: Warm and dry, no rashes. Cardiac: Regular rate and rhythm, no murmurs rubs or gallops, no lower extremity edema.  Respiratory: Clear to auscultation bilaterally. Not using accessory muscles, speaking in full sentences. Left knee: Only minimal residual palpable bursitis, no tenderness, no bruising.  A symptomatic. Palpation normal with no warmth or joint line tenderness or patellar tenderness or condyle tenderness. ROM normal in flexion and extension and lower leg rotation. Ligaments with solid consistent endpoints including ACL, PCL, LCL, MCL. Negative Mcmurray's and provocative meniscal tests. Non painful patellar compression. Patellar and quadriceps tendons unremarkable. Hamstring and quadriceps strength is normal.  Impression and Recommendations:    Hemorrhagic prepatellar bursitis of left knee Completely resolved after aspiration and injection at the last visit.   Return as needed. ___________________________________________ Gwen Her. Dianah Field, M.D., ABFM., CAQSM. Primary Care and [place] Instructor of [place] [employer] [facility]

## 2017-12-21 ENCOUNTER — Ambulatory Visit (HOSPITAL_COMMUNITY): Payer: Medicare Other

## 2017-12-22 ENCOUNTER — Telehealth: Payer: Self-pay | Admitting: *Deleted

## 2017-12-22 NOTE — Telephone Encounter (Signed)
-----   Message from Sueanne Margarita, MD sent at 12/17/2017  4:07 PM EDT ----- Please let patient know that they had a successful PAP titration and let DME know that orders are in EPIC.  Please set up 10 week OV with me.

## 2017-12-22 NOTE — Telephone Encounter (Signed)
Called results lmtcb on cell. 

## 2017-12-22 NOTE — Telephone Encounter (Signed)
Informed patient of sleep study results and patient understanding was verbalized. Patient understands his sleep study showed they had a successful PAP titration. Upon patient request DME selection is [facility]. Patient understands he will be contacted by [facility] to set up his cpap. Patient understands to call if [facility] does not contact him with new setup in a timely manner. Patient understands they will be called once confirmation has been received from [facility] that they have received their new machine to schedule 10 week follow up appointment.  [facility] notified of new cpap order  Please add to airview Patient was grateful for the call and thanked me.

## 2017-12-23 ENCOUNTER — Ambulatory Visit (HOSPITAL_COMMUNITY): Payer: Medicare Other

## 2017-12-25 ENCOUNTER — Ambulatory Visit (HOSPITAL_COMMUNITY): Payer: Medicare Other

## 2017-12-25 NOTE — Telephone Encounter (Signed)
Called results lmtcb on cell. 

## 2017-12-28 ENCOUNTER — Ambulatory Visit (HOSPITAL_COMMUNITY): Payer: Medicare Other

## 2017-12-28 ENCOUNTER — Telehealth: Payer: Self-pay | Admitting: *Deleted

## 2017-12-28 NOTE — Telephone Encounter (Signed)
Informed patient of sleep study results and patient understanding was verbalized. Patient understands his sleep study showed they had a successful PAP titration and cpap orders are in Epic. Upon patient request DME selection is [facility]. Patient understands he will be contacted by [facility] to set up his cpap. Patient understands to call if [facility] does not contact him with new setup in a timely manner. Patient understands they will be called once confirmation has been received from [facility] that they have received their new machine to schedule 10 week follow up appointment.  [facility] notified of new cpap order  Please add to airview Patient was grateful for the call and thanked me.

## 2017-12-28 NOTE — Telephone Encounter (Signed)
Return call:   Informed patient of sleep study results and patient understanding was verbalized. Patient understands his sleep study showed they had a successful PAP titration and cpap orders are in Epic. Upon patient request DME selection is [facility]. Patient understands he will be contacted by [facility] to set up his cpap. Patient understands to call if [facility] does not contact him with new setup in a timely manner. Patient understands they will be called once confirmation has been received from [facility] that they have received their new machine to schedule 10 week follow up appointment.  [facility] notified of new cpap order  Please add to airview Patient was grateful for the call and thanked me.

## 2017-12-28 NOTE — Telephone Encounter (Signed)
-----   Message from Sueanne Margarita, MD sent at 12/17/2017  4:07 PM EDT ----- Please let patient know that they had a successful PAP titration and let DME know that orders are in EPIC.  Please set up 10 week OV with me.

## 2017-12-30 ENCOUNTER — Ambulatory Visit (HOSPITAL_COMMUNITY): Payer: Medicare Other

## 2017-12-30 NOTE — Telephone Encounter (Signed)
Patient has a 10 week follow up appointment scheduled for .03/30/2018. Patient understands he needs to keep this appointment for insurance compliance. [facility] will contact patient about his follow up appointment.

## 2018-01-01 ENCOUNTER — Ambulatory Visit (HOSPITAL_COMMUNITY): Payer: Medicare Other

## 2018-01-04 ENCOUNTER — Ambulatory Visit (HOSPITAL_COMMUNITY): Payer: Medicare Other

## 2018-01-06 ENCOUNTER — Ambulatory Visit (HOSPITAL_COMMUNITY): Payer: Medicare Other

## 2018-01-06 DIAGNOSIS — G4733 Obstructive sleep apnea (adult) (pediatric): Secondary | ICD-10-CM | POA: Diagnosis not present

## 2018-01-08 ENCOUNTER — Ambulatory Visit (HOSPITAL_COMMUNITY): Payer: Medicare Other

## 2018-01-11 ENCOUNTER — Ambulatory Visit (HOSPITAL_COMMUNITY): Payer: Medicare Other

## 2018-01-13 ENCOUNTER — Ambulatory Visit (HOSPITAL_COMMUNITY): Payer: Medicare Other

## 2018-01-15 ENCOUNTER — Ambulatory Visit (HOSPITAL_COMMUNITY): Payer: Medicare Other

## 2018-01-18 ENCOUNTER — Ambulatory Visit (HOSPITAL_COMMUNITY): Payer: Medicare Other

## 2018-01-18 NOTE — Telephone Encounter (Signed)
Patient states the air is blowing his cheeks out. The pressure is too strong..Pressure is on 16 cm. He  only wore it for the first 5 nights and got no sleep. Patient has not been wearing his mask since last Thursday night and has been sleeping.

## 2018-01-20 ENCOUNTER — Ambulatory Visit (HOSPITAL_COMMUNITY): Payer: Medicare Other

## 2018-01-22 ENCOUNTER — Ambulatory Visit (HOSPITAL_COMMUNITY): Payer: Medicare Other

## 2018-01-25 ENCOUNTER — Ambulatory Visit (HOSPITAL_COMMUNITY): Payer: Medicare Other

## 2018-01-27 ENCOUNTER — Ambulatory Visit (HOSPITAL_COMMUNITY): Payer: Medicare Other

## 2018-01-29 ENCOUNTER — Ambulatory Visit (HOSPITAL_COMMUNITY): Payer: Medicare Other

## 2018-02-01 ENCOUNTER — Ambulatory Visit (HOSPITAL_COMMUNITY): Payer: Medicare Other

## 2018-02-03 ENCOUNTER — Ambulatory Visit (HOSPITAL_COMMUNITY): Payer: Medicare Other

## 2018-02-05 ENCOUNTER — Ambulatory Visit (HOSPITAL_COMMUNITY): Payer: Medicare Other

## 2018-02-06 DIAGNOSIS — G4733 Obstructive sleep apnea (adult) (pediatric): Secondary | ICD-10-CM | POA: Diagnosis not present

## 2018-02-08 ENCOUNTER — Ambulatory Visit (HOSPITAL_COMMUNITY): Payer: Medicare Other

## 2018-02-08 NOTE — Telephone Encounter (Signed)
RE: PRESSURE CHANGE Sueanne Margarita, MD  Freada Bergeron, CMA        Since he is intolerant to CPAP likely needs BIPAP - please set up in lab BiPAP titration   Traci TUrner

## 2018-02-08 NOTE — Telephone Encounter (Signed)
Called patient lmtcb 

## 2018-02-10 ENCOUNTER — Ambulatory Visit (HOSPITAL_COMMUNITY): Payer: Medicare Other

## 2018-02-12 ENCOUNTER — Ambulatory Visit (HOSPITAL_COMMUNITY): Payer: Medicare Other

## 2018-02-15 ENCOUNTER — Ambulatory Visit (HOSPITAL_COMMUNITY): Payer: Medicare Other

## 2018-02-17 ENCOUNTER — Ambulatory Visit (HOSPITAL_COMMUNITY): Payer: Medicare Other

## 2018-02-19 ENCOUNTER — Ambulatory Visit (HOSPITAL_COMMUNITY): Payer: Medicare Other

## 2018-02-22 ENCOUNTER — Ambulatory Visit (HOSPITAL_COMMUNITY): Payer: Medicare Other

## 2018-02-24 ENCOUNTER — Ambulatory Visit (HOSPITAL_COMMUNITY): Payer: Medicare Other

## 2018-02-26 ENCOUNTER — Ambulatory Visit (HOSPITAL_COMMUNITY): Payer: Medicare Other

## 2018-03-01 ENCOUNTER — Ambulatory Visit (HOSPITAL_COMMUNITY): Payer: Medicare Other

## 2018-03-03 ENCOUNTER — Encounter: Payer: Self-pay | Admitting: Cardiology

## 2018-03-03 ENCOUNTER — Ambulatory Visit (HOSPITAL_COMMUNITY): Payer: Medicare Other

## 2018-03-08 DIAGNOSIS — G4733 Obstructive sleep apnea (adult) (pediatric): Secondary | ICD-10-CM | POA: Diagnosis not present

## 2018-03-25 NOTE — Progress Notes (Signed)
Electrophysiology Office Note   Date:  03/26/2018   ID:  Stephen King, DOB 10-18-1950, MRN [id number]  PCP:  Donella Stade, PA-C  Cardiologist:  Marlou Porch Primary Electrophysiologist:  Dr Curt Bears    CC: Follow up for atrial fibrillation   History of Present Illness: Stephen King is a 68 y.o. male who is being seen today for the evaluation of atrial fibrillation at the request of Stephen King. Presenting today for electrophysiology evaluation.  He has a history of stroke in 2011, paroxysmal atrial fibrillation on Xarelto.  He also has a history of coronary artery disease status post circumflex stent 08/14/2017.  Is currently on aspirin, Plavix, Xarelto.  He has had more frequent episodes of atrial fibrillation with episodes October 2018, April 2019, and May 2019.  On 08/15/2017, he presented to [facility] for A. fib with RVR.  Heart rate was in the 170s at this time.  Cardioversion was attempted but was unsuccessful x2.  He was placed on amiodarone and transferred to [facility], he was started on amiodarone 400 mg daily.  He had an ablation for atrial fibrillation 09/17/2017.  Today, denies symptoms of palpitations, chest pain, shortness of breath, orthopnea, PND, lower extremity edema, claudication, dizziness, presyncope, syncope, bleeding, or neurologic sequela. The patient is tolerating medications without difficulties. He reports that he has 10-20 minute episodes of palpitations about once per week which terminate on their own. He uses a fitbit and his BP monitor to check his pulse during the episodes and his HR is around 90-100. He is happy with his current therapy.  He did have a sleep study and was started on CPAP therapy but could not tolerate it. Recommendation for BiPap was made but he declined.     Past Medical History:  Diagnosis Date  . Arthritis    "?right knee" (08/13/2017)  . Dysrhythmia    PAF  . Hernia, umbilical   . History of hiatal hernia    . History of kidney stones   . Stroke [facility]) 12/2009   "has to watch where he's walking since; more of a spacial thing resulting from stroke" (08/13/2017)   Past Surgical History:  Procedure Laterality Date  . ABDOMINAL HERNIA REPAIR     "has a mesh in his abdomen"  . ANTERIOR CERVICAL DECOMP/DISCECTOMY FUSION  10/2005   C6-7  . ATRIAL FIBRILLATION ABLATION N/A 09/17/2017   Procedure: ATRIAL FIBRILLATION ABLATION;  Surgeon: Constance Haw, MD;  Location: [place] [facility];  Service: Cardiovascular;  Laterality: N/A;  . BACK SURGERY    . CARDIAC CATHETERIZATION  2003  . CATARACT EXTRACTION W/ INTRAOCULAR LENS  IMPLANT, BILATERAL Bilateral   . CORONARY ANGIOPLASTY WITH STENT PLACEMENT  08/13/2017  . CORONARY STENT INTERVENTION N/A 08/13/2017   Procedure: CORONARY STENT INTERVENTION;  Surgeon: Burnell Blanks, MD;  Location: [place] [facility];  Service: Cardiovascular;  Laterality: N/A;  . EXCISIONAL HEMORRHOIDECTOMY    . HERNIA REPAIR    . LEFT HEART CATH AND CORONARY ANGIOGRAPHY N/A 08/13/2017   Procedure: LEFT HEART CATH AND CORONARY ANGIOGRAPHY;  Surgeon: Burnell Blanks, MD;  Location: [place] [facility];  Service: Cardiovascular;  Laterality: N/A;  . SHOULDER ARTHROSCOPY WITH ROTATOR CUFF REPAIR Right 10/30/2014   Procedure: SHOULDER ARTHROSCOPY WITH ROTATOR CUFF REPAIR;  Surgeon: Tania Ade, MD;  Location: [place];  Service: Orthopedics;  Laterality: Right;  . SHOULDER ARTHROSCOPY WITH SUBACROMIAL DECOMPRESSION Right 10/30/2014   Procedure:  Right shoulder arthroscopy rotator cuff repair, subacromial decompression;  Surgeon: Tania Ade, MD;  Location: [place];  Service: Orthopedics;  Laterality: Right;  Right shoulder arthrscopy rotator cuff repair, subacromial decompression     Current Outpatient Medications  Medication Sig Dispense Refill  . AMBULATORY NON FORMULARY MEDICATION shingrx 2 doses to prevent shingles. 2  application 0  . atorvastatin (LIPITOR) 80 MG tablet Take 1 tablet (80 mg total) by mouth daily at 6 PM. 90 tablet 3  . clopidogrel (PLAVIX) 75 MG tablet Take 1 tablet (75 mg total) by mouth daily with breakfast. 90 tablet 3  . metoprolol tartrate (LOPRESSOR) 25 MG tablet Take 0.5 tablets (12.5 mg total) by mouth 2 (two) times daily. 90 tablet 3  . nitroGLYCERIN (NITROSTAT) 0.4 MG SL tablet Place 0.4 mg under the tongue every 5 (five) minutes x 3 doses as needed for chest pain.    . rivaroxaban (XARELTO) 20 MG TABS tablet TAKE 1 TABLET BY MOUTH  DAILY WITH SUPPER 90 tablet 1   No current facility-administered medications for this visit.     Allergies:   Feldene [piroxicam]   Social History:  The patient  reports that he quit smoking about 19 years ago. His smoking use included cigarettes. He quit after 27.00 years of use. He has never used smokeless tobacco. He reports that he does not drink alcohol or use drugs.   Family History:  The patient's family history includes Alcohol abuse in his father and mother; Arrhythmia in his mother; Colon cancer in his father; Stroke in his maternal grandfather.   ROS:  Please see the history of present illness.   Otherwise, review of systems is positive for palpitations, snoring, easy bruising.   All other systems are reviewed and negative.   PHYSICAL EXAM: VS:  BP 106/70   Pulse 66   Ht 5\' 10"  (1.778 m)   Wt 215 lb 12.8 oz (97.9 kg)   SpO2 97%   BMI 30.96 kg/m  , BMI Body mass index is 30.96 kg/m. GEN: Well nourished, well developed, in no acute distress  HEENT: normal  Neck: no JVD, carotid bruits, or masses Cardiac: RRR; no murmurs, rubs, or gallops,no edema  Respiratory:  clear to auscultation bilaterally, normal work of breathing GI: soft, nontender, nondistended, + BS MS: no deformity or atrophy  Skin: warm and dry Neuro:  Strength and sensation are intact Psych: euthymic mood, full affect  EKG:  EKG is ordered today. Personal review of  the ekg ordered shows sinus rhythm HR 66 PR 190, QRS 86, QTc 396   Recent Labs: 08/18/2017: BUN 11; Creatinine, Ser 1.10; Hemoglobin 15.4; Platelets 315; Potassium 4.5; Sodium 141    Lipid Panel     Component Value Date/Time   CHOL 98 11/27/2017 1127   TRIG 39 11/27/2017 1127   HDL 43 11/27/2017 1127   CHOLHDL 2.3 11/27/2017 1127   VLDL 8 11/02/2015 0804   LDLCALC 44 11/27/2017 1127     Wt Readings from Last 3 Encounters:  03/26/18 215 lb 12.8 oz (97.9 kg)  12/16/17 216 lb (98 kg)  12/14/17 208 lb (94.3 kg)      Other studies Reviewed: Additional studies/ records that were reviewed today include: LHC 08/13/17  Review of the above records today demonstrates:   Prox RCA lesion is 20% stenosed.  Mid RCA lesion is 30% stenosed.  Prox Cx lesion is 80% stenosed.  A drug-eluting stent was successfully placed using a STENT RESOLUTE ONYX 4.0X18.  Post intervention, there is a 0% residual stenosis.  The left ventricular systolic function is normal.  LV end diastolic pressure is normal.  The left ventricular ejection fraction is greater than 65% by visual estimate.  There is no mitral valve regurgitation.   1. Severe stenosis proximal Circumflex artery 2. Successful PTCA/DES x 1 proximal Circumflex but unfortunately a very small caliber obtuse marginal branch was jailed by the stent and flow was lost down this branch. The branch was 0.5-0.75 mm and too small for a balloon or stent. He had severe chest pain post stent deployment felt to be due to the loss of flow down this small branch.  3. Mild non-obstructive plaque in the RCA 4. Normal LV systolic function  TTE 9449 - Left ventricle: The cavity size was normal. There was mild concentric hypertrophy. Systolic function was normal. The estimated ejection fraction was in the range of 60% to 65%. Wall motion was normal; there were no regional wall motion abnormalities. - Mitral valve: There was systolic anterior  motion. There was mild regurgitation. - Right ventricle: The cavity size was mildly dilated. Wall thickness was normal. - Right atrium: The atrium was mildly dilated.  ASSESSMENT AND PLAN:  1.  Paroxysmal atrial fibrillation: Currently metoprolol and Xarelto.  Status post ablation 09/17/2017.  Infrequent and brief episodes of palpitations. Patient does not want AAD at this point. Nickolis Diel reconsider if episodes become more frequent or severe.  No changes.  This patients CHA2DS2-VASc Score and unadjusted Ischemic Stroke Rate (% per year) is equal to 4.8 % stroke rate/year from a score of 4  Above score calculated as 1 point each if present [CHF, HTN, DM, Vascular=MI/PAD/Aortic Plaque, Age if 65-74, or Male] Above score calculated as 2 points each if present [Age > 75, or Stroke/TIA/TE]   2.  Coronary artery disease: Status post circumflex stent 08/15/2017.  Currently on Plavix and Xarelto.    3.  Obesity: Weight loss encouraged  4.  Obstructive sleep apnea: CPAP compliance encouraged Per patient request, Henretter Piekarski refer him to pulmonary for further evaluation and treatment.    5. CVA: 2011. Continue statin, Plavix, and Xarelto  The patient does not have concerns regarding his medicines.  The following changes were made today:  none  Labs/ tests ordered today include:  No orders of the defined types were placed in this encounter.   Disposition:   FU with Alessa Mazur 6 months  Johann Capers, [place]  03/26/2018 10:14 AM     [facility] [address] [place] [address] [phone] (office) [phone] (fax)  I have seen and examined this patient with Adline Peals.  Agree with above, note added to reflect my findings.  On exam, RRR, no murmurs, lungs clear.  She is status post atrial fibrillation ablation.  He has done well with minimal atrial fibrillation since that time.  Amiodarone has been stopped.  He does have obstructive sleep apnea and  requests referral to pulmonary to treat his sleep apnea.  Lavaeh Bau M. Zenna Traister MD 03/26/2018 10:42 AM

## 2018-03-25 NOTE — Telephone Encounter (Signed)
Reached back out to the patient to give him Dr Landis Gandy Bipap recommendation and patient states his answer is still no, he will not do any more testing and he will speak to Dr Curt Bears about his decision at his next visit.

## 2018-03-26 ENCOUNTER — Ambulatory Visit: Payer: Medicare Other | Admitting: Cardiology

## 2018-03-26 ENCOUNTER — Encounter: Payer: Self-pay | Admitting: Cardiology

## 2018-03-26 VITALS — BP 106/70 | HR 66 | Ht 70.0 in | Wt 215.8 lb

## 2018-03-26 DIAGNOSIS — I2510 Atherosclerotic heart disease of native coronary artery without angina pectoris: Secondary | ICD-10-CM

## 2018-03-26 DIAGNOSIS — I480 Paroxysmal atrial fibrillation: Secondary | ICD-10-CM

## 2018-03-26 DIAGNOSIS — G4733 Obstructive sleep apnea (adult) (pediatric): Secondary | ICD-10-CM | POA: Diagnosis not present

## 2018-03-26 NOTE — Patient Instructions (Signed)
Medication Instructions:  Your physician recommends that you continue on your current medications as directed. Please refer to the Current Medication list given to you today.  * If you need a refill on your cardiac medications before your next appointment, please call your pharmacy.   Labwork: None ordered  Testing/Procedures: None ordered  Follow-Up: You have been referred to pulmonary to follow your sleep apnea.  Your physician wants you to follow-up in: 6 months with Dr. Curt Bears.  You will receive a reminder letter in the mail two months in advance. If you don't receive a letter, please call our office to schedule the follow-up appointment.   Thank you for choosing [facility]!!   Trinidad Curet, RN [phone]

## 2018-03-26 NOTE — Addendum Note (Signed)
Addended by: Allegra Lai M on: 03/26/2018 10:43 AM   Modules accepted: Level of Service

## 2018-03-27 ENCOUNTER — Other Ambulatory Visit: Payer: Self-pay | Admitting: Cardiology

## 2018-03-29 NOTE — Telephone Encounter (Signed)
Xarelto 20mg  refill request received; pt is 68 yrs old, wt-97.9kg, Crea-1.10 on 08/18/17, last seen by Dr. Curt Bears on 03/26/2018, [place].70ml/min; will send in refill to requested pharmacy.

## 2018-03-30 ENCOUNTER — Ambulatory Visit: Payer: Medicare Other | Admitting: Cardiology

## 2018-03-30 NOTE — Addendum Note (Signed)
Addended by: Rose Phi on: 03/30/2018 01:18 PM   Modules accepted: Orders

## 2018-04-21 ENCOUNTER — Emergency Department
Admission: EM | Admit: 2018-04-21 | Discharge: 2018-04-21 | Disposition: A | Discharge status: Home / Self Care | Payer: Medicare Other | Source: Home / Self Care

## 2018-04-21 ENCOUNTER — Other Ambulatory Visit: Payer: Self-pay

## 2018-04-21 ENCOUNTER — Encounter: Payer: Self-pay | Admitting: Family Medicine

## 2018-04-21 DIAGNOSIS — J0100 Acute maxillary sinusitis, unspecified: Secondary | ICD-10-CM | POA: Diagnosis not present

## 2018-04-21 MED ORDER — AMOXICILLIN-POT CLAVULANATE 875-125 MG PO TABS: 1.0000 | ORAL_TABLET | Freq: Two times a day (BID) | ORAL | 0 refills | Status: DC

## 2018-04-21 NOTE — ED Triage Notes (Signed)
Pt c/o nasal congestion and nonproductive cough x 12 days, with bilateral ear fullness and nose bleeds today.

## 2018-04-21 NOTE — Discharge Instructions (Addendum)
You will sleep better using Afrin (oxymetazoline) nasal spray for a couple nights (alternate nostril) (right hand to left nostril, etc)

## 2018-04-21 NOTE — ED Provider Notes (Signed)
[name]    CSN: [id number] Arrival date & time: 04/21/18  1542     History   Chief Complaint Chief Complaint  Patient presents with  . Nasal Congestion  . Cough    HPI Stephen King is a 68 y.o. male.   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  He does not smoke now but he is on blood thinners.  Patient has been trying Sudafed type projects but they have not worked.     Past Medical History:  Diagnosis Date  . Arthritis    "?right knee" (08/13/2017)  . Dysrhythmia    PAF  . Hernia, umbilical   . History of hiatal hernia   . History of kidney stones   . Stroke [facility]) 12/2009   "has to watch where he's walking since; more of a spacial thing resulting from stroke" (08/13/2017)    Patient Active Problem List   Diagnosis Date Noted  . OSA (obstructive sleep apnea) 11/17/2017  . Hemorrhagic prepatellar bursitis of left knee 11/17/2017  . Paroxysmal atrial fibrillation ([place]) 09/17/2017  . Atrial fibrillation ([place]) 08/16/2017  . CAD S/P percutaneous coronary angioplasty 08/14/2017  . Unstable angina ([place])   . Atrial flutter ([place]) 07/28/2017  . No energy 08/11/2016  . Malaise 08/11/2016  . Multiple joint pain 03/10/2016  . Weakness 03/10/2016  . Myalgia 03/10/2016  . left knee pain 10/26/2015  . Pre-operative cardiovascular examination 10/27/2014  . Acute bronchitis 10/19/2014  . Right shoulder pain 11/04/2013  . 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 Primary osteoarthritis of both knees 10/07/2013  . History of CVA (cerebrovascular accident) 10/06/2013  . Chronic anticoagulation 10/06/2013  . PAF (paroxysmal atrial fibrillation) ([place]) 08/29/2013  . Atypical nevus 08/29/2013  . Chest pain 08/21/2013  . Atrial fibrillation with RVR ([place]) 08/21/2013  . 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 Mass of right side of neck 06/06/2013  . 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 Urgency of  urination 08/26/2011  . PARESTHESIA 12/24/2010  . HEAD TRAUMA, CLOSED 12/24/2010    Past Surgical History:  Procedure Laterality Date  . ABDOMINAL HERNIA REPAIR     "has a mesh in his abdomen"  . ANTERIOR CERVICAL DECOMP/DISCECTOMY FUSION  10/2005   C6-7  . ATRIAL FIBRILLATION ABLATION N/A 09/17/2017   Procedure: ATRIAL FIBRILLATION ABLATION;  Surgeon: Constance Haw, MD;  Location: [place] [facility];  Service: Cardiovascular;  Laterality: N/A;  . BACK SURGERY    . CARDIAC CATHETERIZATION  2003  . CATARACT EXTRACTION W/ INTRAOCULAR LENS  IMPLANT, BILATERAL Bilateral   . CORONARY ANGIOPLASTY WITH STENT PLACEMENT  08/13/2017  . CORONARY STENT INTERVENTION N/A 08/13/2017   Procedure: CORONARY STENT INTERVENTION;  Surgeon: Burnell Blanks, MD;  Location: [place] [facility];  Service: Cardiovascular;  Laterality: N/A;  . EXCISIONAL HEMORRHOIDECTOMY    . HERNIA REPAIR    . LEFT HEART CATH AND CORONARY ANGIOGRAPHY N/A 08/13/2017   Procedure: LEFT HEART CATH AND CORONARY ANGIOGRAPHY;  Surgeon: Burnell Blanks, MD;  Location: [place] [facility];  Service: Cardiovascular;  Laterality: N/A;  . SHOULDER ARTHROSCOPY WITH ROTATOR CUFF REPAIR Right 10/30/2014   Procedure: SHOULDER ARTHROSCOPY WITH ROTATOR CUFF REPAIR;  Surgeon: Tania Ade, MD;  Location: [place];  Service: Orthopedics;  Laterality: Right;  . SHOULDER ARTHROSCOPY WITH SUBACROMIAL DECOMPRESSION Right 10/30/2014   Procedure: Right shoulder arthroscopy rotator cuff repair, subacromial decompression;  Surgeon: Tania Ade,  MD;  Location: [place];  Service: Orthopedics;  Laterality: Right;  Right shoulder arthrscopy rotator cuff repair, subacromial decompression       Home Medications    Prior to Admission medications   Medication Sig Start Date End Date Taking? Authorizing Provider  AMBULATORY NON FORMULARY MEDICATION shingrx 2 doses to prevent shingles. 11/28/17   Breeback,  Jade L, PA-C  amoxicillin-clavulanate (AUGMENTIN) 875-125 MG tablet Take 1 tablet by mouth every 12 (twelve) hours. 04/21/18   Robyn Haber, MD  atorvastatin (LIPITOR) 80 MG tablet Take 1 tablet (80 mg total) by mouth daily at 6 PM. 08/14/17   Rosalyn Gess, Doreene Burke, PA-C  clopidogrel (PLAVIX) 75 MG tablet Take 1 tablet (75 mg total) by mouth daily with breakfast. 08/15/17   Erlene Quan, PA-C  metoprolol tartrate (LOPRESSOR) 25 MG tablet Take 0.5 tablets (12.5 mg total) by mouth 2 (two) times daily. 08/05/17   Jerline Pain, MD  nitroGLYCERIN (NITROSTAT) 0.4 MG SL tablet Place 0.4 mg under the tongue every 5 (five) minutes x 3 doses as needed for chest pain.    [provider]  XARELTO 20 MG TABS tablet TAKE 1 TABLET BY MOUTH  DAILY WITH SUPPER 03/29/18   Camnitz, Ocie Doyne, MD    Family History Family History  Problem Relation Age of Onset  . Alcohol abuse Mother   . Arrhythmia Mother   . Colon cancer Father   . Alcohol abuse Father   . Stroke Maternal Grandfather     Social History Social History   Tobacco Use  . Smoking status: Former Smoker    Years: 27.00    Types: Cigarettes    Last attempt to quit: 03/25/1999    Years since quitting: 19.0  . Smokeless tobacco: Never Used  Substance Use Topics  . Alcohol use: No  . Drug use: No     Allergies   Feldene [piroxicam]   Review of Systems Review of Systems  Constitutional: Negative.   HENT: Positive for congestion, nosebleeds and sinus pressure.   Respiratory: Positive for cough.   All other systems reviewed and are negative.    Physical Exam  Updated Vital Signs BP 122/81 (BP Location: Right Arm)   Pulse 73   Temp 97.7 F (36.5 C) (Oral)   Resp 18   Ht 5\' 10"  (1.778 m)   Wt 96.6 kg   SpO2 97%   BMI 30.56 kg/m    Physical Exam Vitals signs and nursing note reviewed.  Constitutional:      Appearance: Normal appearance.  HENT:     Head: Normocephalic.     Right Ear: Tympanic membrane, ear canal  and external ear normal.     Left Ear: Tympanic membrane, ear canal and external ear normal.     Nose:     Comments: Some dried blood and mucopurulent discharge in both nasal passages    Mouth/Throat:     Mouth: Mucous membranes are moist.     Pharynx: Oropharynx is clear.  Eyes:     Conjunctiva/sclera: Conjunctivae normal.     Pupils: Pupils are equal, round, and reactive to light.  Neck:     Musculoskeletal: Normal range of motion and neck supple.  Cardiovascular:     Rate and Rhythm: Normal rate and regular rhythm.     Heart sounds: Normal heart sounds.  Pulmonary:     Effort: Pulmonary effort is normal.     Breath sounds: Normal breath sounds.  Musculoskeletal: Normal range of  motion.  Skin:    General: Skin is warm and dry.  Neurological:     General: No focal deficit present.     Mental Status: He is alert.  Psychiatric:        Mood and Affect: Mood normal.      UC Treatments / Results  Labs (all labs ordered are listed, but only abnormal results are displayed) Labs Reviewed - No data to display  EKG None  Radiology No results found.  Procedures Procedures (including critical care time)  Medications Ordered in UC Medications - No data to display  Initial Impression / Assessment and Plan / UC Course  I have reviewed the triage vital signs and the nursing notes.  Pertinent labs & imaging results that were available during my care of the patient were reviewed by me and considered in my medical decision making (see chart for details).    Final Clinical Impressions(s) / UC Diagnoses   Final diagnoses:  Acute non-recurrent maxillary sinusitis     Discharge Instructions     You will sleep better using Afrin (oxymetazoline) nasal spray for a couple nights (alternate nostril) (right hand to left nostril, etc)    ED Prescriptions    Medication Sig Dispense Auth. Provider   amoxicillin-clavulanate (AUGMENTIN) 875-125 MG tablet Take 1 tablet by mouth  every 12 (twelve) hours. 14 tablet Robyn Haber, MD     Controlled Substance Prescriptions  Controlled Substance Registry consulted? Not Applicable   Robyn Haber, MD 04/21/18 [phone]

## 2018-04-26 ENCOUNTER — Telehealth: Payer: Self-pay | Admitting: Cardiology

## 2018-04-26 NOTE — Telephone Encounter (Signed)
Patient called back and wants you to know that he has scheduled the appointment.Marland KitchenMarland Kitchen

## 2018-05-03 ENCOUNTER — Ambulatory Visit (INDEPENDENT_AMBULATORY_CARE_PROVIDER_SITE_OTHER): Payer: Medicare Other | Admitting: Pulmonary Disease

## 2018-05-03 ENCOUNTER — Encounter: Payer: Self-pay | Admitting: Pulmonary Disease

## 2018-05-03 VITALS — BP 116/80 | HR 63 | Ht 70.0 in | Wt 214.2 lb

## 2018-05-03 DIAGNOSIS — G4733 Obstructive sleep apnea (adult) (pediatric): Secondary | ICD-10-CM | POA: Diagnosis not present

## 2018-05-03 NOTE — Progress Notes (Signed)
[facility]  Chief Complaint  Patient presents with  . Sleep Consult    had sleep study in August    Constitutional:  BP 116/80 (BP Location: Right Arm, Cuff Size: Normal)   Pulse 63   Ht 5\' 10"  (1.778 m)   Wt 214 lb 3.2 oz (97.2 kg)   SpO2 95%   BMI 30.73 kg/m   Past Medical History:  CAD s/p stent, PAF, CVA, Nephrolithiasis, HH, OA  Brief Summary:  Stephen King is a 68 y.o. male with obstructive sleep apnea.  He is followed by cardiology for atrial fibrillation.  He also has a history of a stroke and CAD.  He had a sleep study in August 2019 that showed mild obstructive sleep apnea.  He was briefly tried on CPAP, but had difficulty tolerating mask and pressure.  He was advised by his cardiologist to have further sleep assessment.  He had a titration study in September 2019 and was set up with CPAP 15 cm H2O.  He felt like his face was getting blown off.  He goes to sleep at 11 pm.  He falls asleep in a few minutes.  He wakes up some times to use the bathroom.  He gets out of bed at 9 am.  He feels tired in the morning.  He denies morning headache.  He does not use anything to help him fall sleep or stay awake.  He denies sleep walking, sleep talking, bruxism, or nightmares.  There is no history of restless legs.  He denies sleep hallucinations, sleep paralysis, or cataplexy.  The Epworth score is 3 out of 24.    Physical Exam:   Appearance - well kempt   ENMT - clear nasal mucosa, midline nasal  septum, no oral exudates, no LAN, trachea midline  Respiratory - normal chest wall, normal respiratory effort, no accessory muscle use, no wheeze/rales  CV - s1s2 regular rate and rhythm, no murmurs, no peripheral edema, radial pulses symmetric  GI - soft, non tender, no masses  Lymph - no adenopathy noted in neck and axillary areas  MSK - normal gait  Ext - no cyanosis, clubbing, or joint inflammation noted  Skin - no rashes,  lesions, or ulcers  Neuro - normal strength, oriented x 3  Psych - normal mood and affect  Discussion:  He has snoring and sleep disruption.  He has hx of CAD, A fib and CVA.  His recent sleep study showed mild obstructive sleep apnea.  He has tried using CPAP, but had difficulty tolerating mask and pressure.  He was started on higher end of pressure settings.  Assessment/Plan:   Obstructive sleep apnea. - various therapies for treatment were reviewed: CPAP, oral appliance, and surgical interventions - he is willing to continue with CPAP therapy for now - will try using lower pressure setting to help him get used to wearing CPAP, and then adjust settings as needed - will arrange for auto CPAP set up with range 5 to 15 cm H2O - discussed options to help with mask fit also  Obesity. - discussed how weight can impact sleep and risk for sleep disordered breathing - discussed options to assist with weight loss: combination of diet modification, cardiovascular and strength training exercises  Cardiovascular risk. - had an extensive discussion regarding the adverse health consequences related to untreated sleep disordered breathing - specifically discussed the risks for hypertension, coronary artery disease, cardiac dysrhythmias, cerebrovascular disease, and diabetes - lifestyle  modification discussed  Safe driving practices. - discussed how sleep disruption can increase risk of accidents, particularly when driving - safe driving practices were discussed  Therapies for obstructive sleep apnea. - if the sleep study shows significant sleep apnea, then     Patient Instructions  Will arrange for auto CPAP 5 to 15 cm H2O with heated humidity and mask of choice  Follow up in 2 months after getting CPAP machine    Chesley Mires, MD [facility] Pager: [phone] 05/03/2018, 2:56 PM  Flow Sheet    Sleep tests:  PSG 10/27/17 >> AHI 6.7, SpO2 low 88%  Cardiac  tests:  Echo 08/21/13 >> EF 60 to 65%, mild MR   Review of Systems:  HENT: Positive for nosebleeds and sinus pressure.   Respiratory: Positive for cough, chest tightness and shortness of breath.   Allergic/Immunologic: Positive for environmental allergies.  Psychiatric/Behavioral: The patient is nervous/anxious.    Medications:   Allergies as of 05/03/2018      Reactions   Feldene [piroxicam] Hives      Medication List       Accurate as of May 03, 2018  2:56 PM. Always use your most recent med list.        AMBULATORY NON FORMULARY MEDICATION shingrx 2 doses to prevent shingles.   atorvastatin 80 MG tablet Commonly known as:  LIPITOR Take 1 tablet (80 mg total) by mouth daily at 6 PM.   clopidogrel 75 MG tablet Commonly known as:  PLAVIX Take 1 tablet (75 mg total) by mouth daily with breakfast.   metoprolol tartrate 25 MG tablet Commonly known as:  LOPRESSOR Take 0.5 tablets (12.5 mg total) by mouth 2 (two) times daily.   nitroGLYCERIN 0.4 MG SL tablet Commonly known as:  NITROSTAT Place 0.4 mg under the tongue every 5 (five) minutes x 3 doses as needed for chest pain.   XARELTO 20 MG Tabs tablet Generic drug:  rivaroxaban TAKE 1 TABLET BY MOUTH  DAILY WITH SUPPER       Past Surgical History:  He  has a past surgical history that includes Shoulder arthroscopy with subacromial decompression (Right, 10/30/2014); Shoulder arthroscopy with rotator cuff repair (Right, 10/30/2014); Back surgery; Excisional hemorrhoidectomy; Hernia repair; Abdominal hernia repair; Anterior cervical decomp/discectomy fusion (10/2005); Cataract extraction w/ intraocular lens  implant, bilateral (Bilateral); Cardiac catheterization (2003); Coronary angioplasty with stent (08/13/2017); LEFT HEART CATH AND CORONARY ANGIOGRAPHY (N/A, 08/13/2017); CORONARY STENT INTERVENTION (N/A, 08/13/2017); and ATRIAL FIBRILLATION ABLATION (N/A, 09/17/2017).  Family History:  His family history includes Alcohol  abuse in his father and mother; Arrhythmia in his mother; Colon cancer in his father; Stroke in his maternal grandfather.  Social History:  He  reports that he quit smoking about 19 years ago. His smoking use included cigarettes. He quit after 27.00 years of use. He has never used smokeless tobacco. He reports that he does not drink alcohol or use drugs.

## 2018-05-03 NOTE — Progress Notes (Signed)
   Subjective:    Patient ID: Stephen King, male    DOB: 01-07-1951, 68 y.o.   MRN: [id number]  HPI    Review of Systems  HENT: Positive for nosebleeds and sinus pressure.   Respiratory: Positive for cough, chest tightness and shortness of breath.   Allergic/Immunologic: Positive for environmental allergies.  Psychiatric/Behavioral: The patient is nervous/anxious.        Objective:   Physical Exam        Assessment & Plan:

## 2018-05-03 NOTE — Patient Instructions (Signed)
Will arrange for auto CPAP 5 to 15 cm H2O with heated humidity and mask of choice  Follow up in 2 months after getting CPAP machine

## 2018-05-14 ENCOUNTER — Encounter: Payer: Self-pay | Admitting: Cardiology

## 2018-05-14 ENCOUNTER — Ambulatory Visit: Payer: Medicare Other | Admitting: Cardiology

## 2018-05-14 VITALS — BP 112/74 | HR 70 | Ht 70.0 in | Wt 213.1 lb

## 2018-05-14 DIAGNOSIS — G4733 Obstructive sleep apnea (adult) (pediatric): Secondary | ICD-10-CM

## 2018-05-14 DIAGNOSIS — I2510 Atherosclerotic heart disease of native coronary artery without angina pectoris: Secondary | ICD-10-CM

## 2018-05-14 DIAGNOSIS — Z9889 Other specified postprocedural states: Secondary | ICD-10-CM | POA: Diagnosis not present

## 2018-05-14 DIAGNOSIS — I480 Paroxysmal atrial fibrillation: Secondary | ICD-10-CM

## 2018-05-14 DIAGNOSIS — Z8679 Personal history of other diseases of the circulatory system: Secondary | ICD-10-CM | POA: Diagnosis not present

## 2018-05-14 NOTE — Patient Instructions (Addendum)
Medication Instructions:  On Aug 14, 2018 you may discontinue your Clopidogrel and start Aspirin 81 mg a day. Continue all other medications as listed.  If you need a refill on your cardiac medications before your next appointment, please call your pharmacy.   Follow-Up: At [facility], you and your health needs are our priority.  As part of our continuing mission to provide you with exceptional heart care, we have created designated Provider Care Teams.  These Care Teams include your primary Cardiologist (physician) and Advanced Practice Providers (APPs -  Physician Assistants and Nurse Practitioners) who all work together to provide you with the care you need, when you need it. You will need a follow up appointment in 12 months.  Please call our office 2 months in advance to schedule this appointment.  You may see Candee Furbish, MD or one of the following Advanced Practice Providers on your designated Care Team:   Truitt Merle, NP Cecilie Kicks, NP . Kathyrn Drown, NP  Thank you for choosing [facility]!!

## 2018-05-14 NOTE — Progress Notes (Signed)
Cardiology Office Note:    Date:  05/14/2018   ID:  Stephen King, DOB 24-Dec-1950, MRN [id number]  PCP:  Donella Stade, PA-C  Cardiologist:  Candee Furbish, MD   Referring MD: Donella Stade, PA-C     History of Present Illness:    Stephen King is a 68 y.o. male for follow-up of coronary artery disease status post circumflex stent placed on 08/13/2017 with triple therapy-with stroke in 2011, paroxysmal atrial fibrillation on Xarelto, fairly easily converted with IV diltiazem.  Has had intermittent chest pressure.  Because of this coronary CT was performed and showed obtuse marginal stenosis of greater than 70%.  05/14/2018- here for the follow-up of CAD as above. Back in 2003 had a cardiac catheterization that showed no high-grade CAD.  In May 2018 nuclear stress test was low risk no ischemia.  Most recent cath as above on 08/13/2017 showed a significant circumflex lesion.  This was addressed.   He also sees Dr. Curt Bears for paroxysmal atrial fibrillation.  Previously cardioversion was unsuccessful and he was placed previously on amiodarone and had an ablation for atrial fibrillation on 09/19/2017.  Doing quite well.  Having a few infrequent brief episodes according to Dr. Macky Lower prior note.  CHADSVASc 4.  Xarelto and Plavix.  Past Medical History:  Diagnosis Date  . Arthritis    "?right knee" (08/13/2017)  . Dysrhythmia    PAF  . Hernia, umbilical   . History of hiatal hernia   . History of kidney stones   . Stroke [facility]) 12/2009   "has to watch where he's walking since; more of a spacial thing resulting from stroke" (08/13/2017)    Past Surgical History:  Procedure Laterality Date  . ABDOMINAL HERNIA REPAIR     "has a mesh in his abdomen"  . ANTERIOR CERVICAL DECOMP/DISCECTOMY FUSION  10/2005   C6-7  . ATRIAL FIBRILLATION ABLATION N/A 09/17/2017   Procedure: ATRIAL FIBRILLATION ABLATION;  Surgeon: Constance Haw, MD;  Location: [place] [facility];  Service:  Cardiovascular;  Laterality: N/A;  . BACK SURGERY    . CARDIAC CATHETERIZATION  2003  . CATARACT EXTRACTION W/ INTRAOCULAR LENS  IMPLANT, BILATERAL Bilateral   . CORONARY ANGIOPLASTY WITH STENT PLACEMENT  08/13/2017  . CORONARY STENT INTERVENTION N/A 08/13/2017   Procedure: CORONARY STENT INTERVENTION;  Surgeon: Burnell Blanks, MD;  Location: [place] [facility];  Service: Cardiovascular;  Laterality: N/A;  . EXCISIONAL HEMORRHOIDECTOMY    . HERNIA REPAIR    . LEFT HEART CATH AND CORONARY ANGIOGRAPHY N/A 08/13/2017   Procedure: LEFT HEART CATH AND CORONARY ANGIOGRAPHY;  Surgeon: Burnell Blanks, MD;  Location: [place] [facility];  Service: Cardiovascular;  Laterality: N/A;  . SHOULDER ARTHROSCOPY WITH ROTATOR CUFF REPAIR Right 10/30/2014   Procedure: SHOULDER ARTHROSCOPY WITH ROTATOR CUFF REPAIR;  Surgeon: Tania Ade, MD;  Location: [employer];  Service: Orthopedics;  Laterality: Right;  . SHOULDER ARTHROSCOPY WITH SUBACROMIAL DECOMPRESSION Right 10/30/2014   Procedure: Right shoulder arthroscopy rotator cuff repair, subacromial decompression;  Surgeon: Tania Ade, MD;  Location: [place];  Service: Orthopedics;  Laterality: Right;  Right shoulder arthrscopy rotator cuff repair, subacromial decompression    Current Medications: Current Meds  Medication Sig  . AMBULATORY NON FORMULARY MEDICATION shingrx 2 doses to prevent shingles.  Marland Kitchen atorvastatin (LIPITOR) 80 MG tablet Take 1 tablet (80 mg total) by mouth daily at 6 PM.  . clopidogrel (PLAVIX) 75 MG tablet Take 1 tablet (75  mg total) by mouth daily with breakfast.  . metoprolol tartrate (LOPRESSOR) 25 MG tablet Take 0.5 tablets (12.5 mg total) by mouth 2 (two) times daily.  . nitroGLYCERIN (NITROSTAT) 0.4 MG SL tablet Place 0.4 mg under the tongue every 5 (five) minutes x 3 doses as needed for chest pain.  Marland Kitchen XARELTO 20 MG TABS tablet TAKE 1 TABLET BY MOUTH  DAILY WITH SUPPER      Allergies:   Feldene [piroxicam]   Social History   Socioeconomic History  . Marital status: Married    Spouse name: Not on file  . Number of children: Not on file  . Years of education: Not on file  . Highest education level: Not on file  Occupational History  . Not on file  Social Needs  . Financial resource strain: Not on file  . Food insecurity:    Worry: Not on file    Inability: Not on file  . Transportation needs:    Medical: Not on file    Non-medical: Not on file  Tobacco Use  . Smoking status: Former Smoker    Years: 27.00    Types: Cigarettes    Last attempt to quit: 03/25/1999    Years since quitting: 19.1  . Smokeless tobacco: Never Used  Substance and Sexual Activity  . Alcohol use: No  . Drug use: No  . Sexual activity: Not Currently  Lifestyle  . Physical activity:    Days per week: Not on file    Minutes per session: Not on file  . Stress: Not on file  Relationships  . Social connections:    Talks on phone: Not on file    Gets together: Not on file    Attends religious service: Not on file    Active member of club or organization: Not on file    Attends meetings of clubs or organizations: Not on file    Relationship status: Not on file  Other Topics Concern  . Not on file  Social History Narrative  . Not on file     Family History: The patient's family history includes Alcohol abuse in his father and mother; Arrhythmia in his mother; Colon cancer in his father; Stroke in his maternal grandfather.  ROS:   Please see the history of present illness.    Denies any fevers chills nausea vomiting syncope bleeding  EKGs/Labs/Other Studies Reviewed:    The following studies were reviewed today:  CT coronary on 08/07/2017: 1. Coronary calcium score of 19. This was 15 percentile for age and sex matched control.  2. Normal coronary origin with right dominance.  3. Proximal LCX artery before the takeoff of a large OM1 branch has long severe,  predominantly non-calcified plaque with associated stenosis >70%. Cardiac catheterization is recommended.  4.  Mildly dilated pulmonary artery measuring 32 mm.   Electronically Signed   By: Ena Dawley  Cardiac cath 08/13/17:  Prox RCA lesion is 20% stenosed.  Mid RCA lesion is 30% stenosed.  Prox Cx lesion is 80% stenosed.  A drug-eluting stent was successfully placed using a STENT RESOLUTE ONYX 4.0X18.  Post intervention, there is a 0% residual stenosis.  The left ventricular systolic function is normal.  LV end diastolic pressure is normal.  The left ventricular ejection fraction is greater than 65% by visual estimate.  There is no mitral valve regurgitation.   1. Severe stenosis proximal Circumflex artery 2. Successful PTCA/DES x 1 proximal Circumflex but unfortunately a very small caliber obtuse  marginal branch was jailed by the stent and flow was lost down this branch. The branch was 0.5-0.75 mm and too small for a balloon or stent. He had severe chest pain post stent deployment felt to be due to the loss of flow down this small branch.  3. Mild non-obstructive plaque in the RCA 4. Normal LV systolic function  Recommendations: Continue triple therapy with ASA/Plavix/Xarelto for one month.  Stop ASA in one month. In one year, consider stopping Plavix and restarting ASA along with Xarelto. Start high intensity statin. Continue beta blocker.   EKG: No new EKG  Recent Labs: 08/18/2017: BUN 11; Creatinine, Ser 1.10; Hemoglobin 15.4; Platelets 315; Potassium 4.5; Sodium 141  Recent Lipid Panel    Component Value Date/Time   CHOL 98 11/27/2017 1127   TRIG 39 11/27/2017 1127   HDL 43 11/27/2017 1127   CHOLHDL 2.3 11/27/2017 1127   VLDL 8 11/02/2015 0804   LDLCALC 44 11/27/2017 1127    Physical Exam:    VS:  BP 112/74   Pulse 70   Ht 5\' 10"  (1.778 m)   Wt 213 lb 1.9 oz (96.7 kg)   SpO2 (!) 89%   BMI 30.58 kg/m     Wt Readings from Last 3 Encounters:    05/14/18 213 lb 1.9 oz (96.7 kg)  05/03/18 214 lb 3.2 oz (97.2 kg)  04/21/18 213 lb (96.6 kg)     GEN: Well nourished, well developed, in no acute distress  HEENT: normal  Neck: no JVD, carotid bruits, or masses Cardiac: RRR; no murmurs, rubs, or gallops,no edema  Respiratory:  clear to auscultation bilaterally, normal work of breathing GI: soft, nontender, nondistended, + BS MS: no deformity or atrophy  Skin: warm and dry, no rash Neuro:  Alert and Oriented x 3, Strength and sensation are intact Psych: euthymic mood, full affect    ASSESSMENT:    1. Paroxysmal atrial fibrillation (HCC)   2. OSA (obstructive sleep apnea)   3. Coronary artery disease involving native coronary artery of native heart without angina pectoris   4. S/P ablation of atrial fibrillation    PLAN:    In order of problems listed above:  CAD post circumflex stent/ Angina/abnormal CT of coronary arteries revealing 70% obtuse marginal stenosis - DES placed to circumflex 08/13/2017.  He is doing very well.  No anginal symptoms currently.  Heart rate, blood pressure, lipids all under excellent control.  Last LDL 44.  Continue with atorvastatin 80 mg. - On 08/14/2018, 1 year past DES placement, lets stop his Plavix and give him aspirin 81 mg.  This will be aspirin and Xarelto together.  Eventually we may be able to go to Xarelto monotherapy.  Trying to minimize bleeding risk.  Paroxysmal atrial fibrillation, flutter - Appreciate Dr. Curt Bears ablation/assistance.  As a few brief episodes.  Overall doing quite well.  Does not desire future antiarrhythmic therapy at this point.  Obesity -Encourage weight loss.  He has been doing a good job of weight loss.  Continue.  Obstructive sleep apnea - Working with Dr. Halford Chessman.  No changes.  Trying to get equipment that works for him.  Creatinine 1.1, hemoglobin 15.4  Medication Adjustments/Labs and Tests Ordered: Current medicines are reviewed at length with the patient  today.  Concerns regarding medicines are outlined above.  No orders of the defined types were placed in this encounter.  No orders of the defined types were placed in this encounter.   Patient Instructions  Medication Instructions:  On Aug 14, 2018 you may discontinue your Clopidogrel and start Aspirin 81 mg a day. Continue all other medications as listed.  If you need a refill on your cardiac medications before your next appointment, please call your pharmacy.   Follow-Up: At [facility], you and your health needs are our priority.  As part of our continuing mission to provide you with exceptional heart care, we have created designated Provider Care Teams.  These Care Teams include your primary Cardiologist (physician) and Advanced Practice Providers (APPs -  Physician Assistants and Nurse Practitioners) who all work together to provide you with the care you need, when you need it. You will need a follow up appointment in 12 months.  Please call our office 2 months in advance to schedule this appointment.  You may see Candee Furbish, MD or one of the following Advanced Practice Providers on your designated Care Team:   Truitt Merle, NP Cecilie Kicks, NP . Kathyrn Drown, NP  Thank you for choosing [facility]!!         Signed, Candee Furbish, MD  05/14/2018 10:37 AM    [place]

## 2018-05-18 ENCOUNTER — Encounter: Payer: Self-pay | Admitting: Pulmonary Disease

## 2018-05-18 ENCOUNTER — Telehealth: Payer: Self-pay | Admitting: Pulmonary Disease

## 2018-05-18 DIAGNOSIS — G4733 Obstructive sleep apnea (adult) (pediatric): Secondary | ICD-10-CM

## 2018-05-18 NOTE — Telephone Encounter (Signed)
Called and left a message for Curt Bears, [facility] to call back.  Only OV was 05/03/18, with Dr Halford Chessman.  Most recent sleep study was by cardiology 12/22/17.

## 2018-05-19 NOTE — Telephone Encounter (Signed)
I don't know why they think a sleep study was done 04/15/18. I had already faxed everything to them except Dr. Juanetta Gosling note which had not been signed when I faxed everything else. I have now reprinted everything again and refaxed to [facility] along with Dr. Juanetta Gosling note

## 2018-05-19 NOTE — Telephone Encounter (Signed)
Routing to [facility] pool for follow up.

## 2018-05-19 NOTE — Telephone Encounter (Signed)
I called and spoke with Cami at [facility] and she stated the reason the were looking for the sleep study don 04/15/18 was because on the order that [facility] put in it stated sleep study done 04/15/18. I have ask Kelli to check into this as to why that date was put in. Cami states from what she can see they have everything that they need.

## 2018-05-20 NOTE — Telephone Encounter (Signed)
Called and spoke with Curt Bears with [facility] phone [phone] Found pt's sleep study from 10/28/17, faxed to her today at fax [phone] Cancelled org order for cpap machine and supplies due to incorrect sleep study date of 04/15/2018 Placed order today for new cpap machine [facility]ME-[facility] Settings: auto cpap 5-15cm with heated humidity, mask of choice.  Scheduled f/u appt for patient with TP 07/24/2018 at 10am  Routing message to VS for him to sign this new order.  VS please advise. Thank you.

## 2018-05-20 NOTE — Telephone Encounter (Signed)
Order signed.

## 2018-06-17 ENCOUNTER — Other Ambulatory Visit: Payer: Self-pay | Admitting: *Deleted

## 2018-06-17 MED ORDER — CLOPIDOGREL BISULFATE 75 MG PO TABS: 75.0000 mg | ORAL_TABLET | Freq: Every day | ORAL | 3 refills | Status: DC

## 2018-06-18 ENCOUNTER — Telehealth: Payer: Self-pay | Admitting: Pulmonary Disease

## 2018-06-18 NOTE — Telephone Encounter (Signed)
Called [facility] and spoke with Irma. States that they received the order in February and set up an appointment for the pt to come in. The pt was a no show. Pt will need to contact them to reschedule this.  Spoke with pt. He is aware of this information and he will contact them. Nothing further was needed.

## 2018-06-21 ENCOUNTER — Other Ambulatory Visit: Payer: Self-pay

## 2018-06-21 MED ORDER — CLOPIDOGREL BISULFATE 75 MG PO TABS: 75.0000 mg | ORAL_TABLET | Freq: Every day | ORAL | 1 refills | Status: DC

## 2018-06-22 ENCOUNTER — Telehealth: Payer: Self-pay | Admitting: Cardiology

## 2018-06-22 NOTE — Telephone Encounter (Signed)
Reference# [id number]    There is a drug interaction between Plavix and Xarelto. This is their final attempt about this.

## 2018-06-22 NOTE — Telephone Encounter (Signed)
Called the pharmacist back, advised we were aware.  They documented, no other questions at this time.

## 2018-06-22 NOTE — Telephone Encounter (Signed)
Please advise, they are calling regarding interaction. Any advice before I contact the pharmacy back?

## 2018-06-22 NOTE — Telephone Encounter (Signed)
Tell them he's fine, we're aware of the interaction

## 2018-07-28 ENCOUNTER — Ambulatory Visit: Payer: Medicare Other | Admitting: Adult Health

## 2018-08-22 ENCOUNTER — Other Ambulatory Visit: Payer: Self-pay | Admitting: Cardiology

## 2018-08-25 ENCOUNTER — Other Ambulatory Visit: Payer: Self-pay | Admitting: Cardiology

## 2018-08-25 NOTE — Telephone Encounter (Signed)
Age 68, weight 96.7kg, SCr 1.10 on 08/18/2017, will be due for labs however will not schedule now due to [place]. CrCl 61mL/min.

## 2018-09-17 ENCOUNTER — Telehealth: Payer: Medicare Other | Admitting: Family

## 2018-09-17 DIAGNOSIS — L237 Allergic contact dermatitis due to plants, except food: Secondary | ICD-10-CM | POA: Diagnosis not present

## 2018-09-17 MED ORDER — PREDNISONE 5 MG PO TABS: 5.0000 mg | ORAL_TABLET | ORAL | 0 refills | Status: DC

## 2018-09-17 NOTE — Progress Notes (Signed)
Greater than 5 minutes, yet less than 10 minutes of time have been spent researching, coordinating, and implementing care for this patient today.  Thank you for the details you included in the comment boxes. Those details are very helpful in determining the best course of treatment for you and help [place] to provide the best care.  We are sorry that you are not feeing well.  Here is how we plan to help!  Based on what you have shared with me it looks like you have had an allergic reaction to the oily resin from a group of plants.  This resin is very sticky, so it easily attaches to your skin, clothing, tools equipment, and pet's fur.    This blistering rash is often called poison ivy rash although it can come from contact with the leaves, stems and roots of poison ivy, poison oak and poison sumac.  The oily resin contains urushiol (u-ROO-she-ol) that produces a skin rash on exposed skin.  The severity of the rash depends on the amount of urushiol that gets on your skin.  A section of skin with more urushiol on it may develop a rash sooner.  The rash usually develops 12-48 hours after exposure and can last two to three weeks.  Your skin must come in direct contact with the plant's oil to be affected.  Blister fluid doesn't spread the rash.  However, if you come into contact with a piece of clothing or pet fur that has urushiol on it, the rash may spread out.  You can also transfer the oil to other parts of your body with your fingers.  Often the rash looks like a straight line because of the way the plant brushes against your skin.    I have developed the following plan to treat your condition Since your rash is widespread or has resulted in a large number of blisters, I have prescribed an oral corticosteroid.  Please follow these recommendations:  I have sent a prednisone dose pack to your chosen pharmacy. Be sure to follow the instructions carefully and complete the entire prescription. You may use  Benadryl or Caladryl topical lotions to sooth the itch and remember cool, not hot, showers and baths can help relieve the itching!  Place cool, wet compresses on the affected area for 15-30 minutes several times a day.  You may also take oral antihistamines, such as diphenhydramine (Benadryl, others), which may also help you sleep better.  Watch your skin for any purulent (pus) drainage or red streaking from the site.  If this occurs, contact your provider.  You may require an antibiotic for a skin infection.  Make sure that the clothes you were wearing as well as any towels or sheets that may have come in contact with the oil (urushiol) are washed in detergent and hot water.         What can you do to prevent this rash?  Avoid the plants.  Learn how to identify poison ivy, poison oak and poison sumac in all seasons.  When hiking or engaging in other activities that might expose you to these plants, try to stay on cleared pathways.  If camping, make sure you pitch your tent in an area free of these plants.  Keep pets from running through wooded areas so that urushiol doesn't accidentally stick to their fur, which you may touch.  Remove or kill the plants.  In your yard, you can get rid of poison ivy by applying an herbicide  or pulling it out of the ground, including the roots, while wearing heavy gloves.  Afterward remove the gloves and thoroughly wash them and your hands.  Don't burn poison ivy or related plants because the urushiol can be carried by smoke.  Wear protective clothing.  If needed, protect your skin by wearing socks, boots, pants, long sleeves and vinyl gloves.  Wash your skin right away.  Washing off the oil with soap and water within 30 minutes of exposure may reduce your chances of getting a poison ivy rash.  Even washing after an hour or so can help reduce the severity of the rash.  If you walk through some poison ivy and then later touch your shoes, you may get some urushiol on your  hands, which may then transfer to your face or body by touching or rubbing.  If the contaminated object isn't cleaned, the urushiol on it can still cause a skin reaction years later.    Be careful not to reuse towels after you have washed your skin.  Also carefully wash clothing in detergent and hot water to remove all traces of the oil.  Handle contaminated clothing carefully so you don't transfer the urushiol to yourself, furniture, rugs or appliances.  Remember that pets can carry the oil on their fur and paws.  If you think your pet may be contaminated with urushiol, put on some long rubber gloves and give your pet a bath.  Finally, be careful not to burn these plants as the smoke can contain traces of the oil.  Inhaling the smoke may result in difficulty breathing. If that occurred you should see a physician as soon as possible.  See your doctor right away if:   The reaction is severe or widespread  You inhaled the smoke from burning poison ivy and are having difficulty breathing  Your skin continues to swell  The rash affects your eyes, mouth or genitals  Blisters are oozing pus  You develop a fever greater than 100 F (37.8 C)  The rash doesn't get better within a few weeks.  If you scratch the poison ivy rash, bacteria under your fingernails may cause the skin to become infected.  See your doctor if pus starts oozing from the blisters.  Treatment generally includes antibiotics.  Poison ivy treatments are usually limited to self-care methods.  And the rash typically goes away on its own in two to three weeks.     If the rash is widespread or results in a large number of blisters, your doctor may prescribe an oral corticosteroid, such as prednisone.  If a bacterial infection has developed at the rash site, your doctor may give you a prescription for an oral antibiotic.  MAKE SURE YOU   Understand these instructions.  Will watch your condition.  Will get help right away if  you are not doing well or get worse.  Thank you for choosing an e-visit. Your e-visit answers were reviewed by a board certified advanced clinical practitioner to complete your personal care plan. Depending upon the condition, your plan could have included both over the counter or prescription medications.  Please review your pharmacy choice. If there is a problem you may use MyChart messaging to have the prescription routed to another pharmacy.   Your safety is important to [place]. If you have drug allergies check your prescription carefully.  You can use MyChart to ask questions about today's visit, request a non-urgent call back, or ask for a work or  school excuse for 24 hours related to this e-Visit. If it has been greater than 24 hours you will need to follow up with your provider, or enter a new e-Visit to address those concerns.   You will get an email in the next two days asking about your experience. I hope that your e-visit has been valuable and will speed your recovery Thank you for choosing an e-visit.

## 2018-09-21 ENCOUNTER — Other Ambulatory Visit: Payer: Self-pay | Admitting: Cardiology

## 2018-09-29 ENCOUNTER — Ambulatory Visit: Payer: Medicare Other

## 2018-11-09 ENCOUNTER — Encounter: Payer: Self-pay | Admitting: Cardiology

## 2018-11-09 ENCOUNTER — Other Ambulatory Visit: Payer: Self-pay

## 2018-11-09 ENCOUNTER — Ambulatory Visit: Payer: Medicare Other | Admitting: Cardiology

## 2018-11-09 VITALS — BP 106/72 | HR 63 | Ht 70.0 in | Wt 203.8 lb

## 2018-11-09 DIAGNOSIS — Z79899 Other long term (current) drug therapy: Secondary | ICD-10-CM | POA: Diagnosis not present

## 2018-11-09 DIAGNOSIS — I480 Paroxysmal atrial fibrillation: Secondary | ICD-10-CM

## 2018-11-09 MED ORDER — ASPIRIN EC 81 MG PO TBEC: 81.0000 mg | DELAYED_RELEASE_TABLET | Freq: Every day | ORAL | 3 refills | Status: DC

## 2018-11-09 MED ORDER — ATORVASTATIN CALCIUM 80 MG PO TABS: 80.0000 mg | ORAL_TABLET | Freq: Every day | ORAL | 2 refills | Status: DC

## 2018-11-09 NOTE — Patient Instructions (Addendum)
Medication Instructions:  Your physician has recommended you make the following change in your medication:  1. RESTART Atorvastatin 80 mg daily 2. START Aspirin 81 mg daily  * If you need a refill on your cardiac medications before your next appointment, please call your pharmacy.   Labwork: Today: BMET & CBC  Testing/Procedures: None ordered  Follow-Up: Your physician wants you to follow-up in: 6 months with Dr. Curt Bears.  You will receive a reminder letter in the mail two months in advance. If you don't receive a letter, please call our office to schedule the follow-up appointment.  Thank you for choosing [facility]!!   Trinidad Curet, RN [phone]

## 2018-11-09 NOTE — Progress Notes (Signed)
Electrophysiology Office Note   Date:  11/09/2018   ID:  Stephen King, DOB 05-21-1950, MRN [id number]  PCP:  Lavada Mesi  Cardiologist:  Marlou Porch Primary Electrophysiologist:  Dr Curt Bears    CC: Follow up for atrial fibrillation   History of Present Illness: Stephen King is a 68 y.o. male who is being seen today for the evaluation of atrial fibrillation at the request of mark Skains. Presenting today for electrophysiology evaluation.  He has a history of stroke in 2011, paroxysmal atrial fibrillation on Xarelto.  He also has a history of coronary artery disease status post circumflex stent 08/14/2017.  Is currently on aspirin, Plavix, Xarelto.  He has had more frequent episodes of atrial fibrillation with episodes October 2018, April 2019, and May 2019.  On 08/15/2017, he presented to [facility] for A. fib with RVR.  Heart rate was in the 170s at this time.  Cardioversion was attempted but was unsuccessful x2.  He was placed on amiodarone and transferred to [place], he was started on amiodarone 400 mg daily.  He had an ablation for atrial fibrillation 09/17/2017.  Today, denies symptoms of palpitations, chest pain, shortness of breath, orthopnea, PND, lower extremity edema, claudication, dizziness, presyncope, syncope, bleeding, or neurologic sequela. The patient is tolerating medications without difficulties.  He has had minimal palpitations since last being seen.  He had one episode that might have been atrial fibrillation but he is unsure.   Past Medical History:  Diagnosis Date  . Arthritis    "?right knee" (08/13/2017)  . Dysrhythmia    PAF  . Hernia, umbilical   . History of hiatal hernia   . History of kidney stones   . Stroke [facility]) 12/2009   "has to watch where he's walking since; more of a spacial thing resulting from stroke" (08/13/2017)   Past Surgical History:  Procedure Laterality Date  . ABDOMINAL HERNIA REPAIR     "has a mesh  in his abdomen"  . ANTERIOR CERVICAL DECOMP/DISCECTOMY FUSION  10/2005   C6-7  . ATRIAL FIBRILLATION ABLATION N/A 09/17/2017   Procedure: ATRIAL FIBRILLATION ABLATION;  Surgeon: Constance Haw, MD;  Location: [place] [facility];  Service: Cardiovascular;  Laterality: N/A;  . BACK SURGERY    . CARDIAC CATHETERIZATION  2003  . CATARACT EXTRACTION W/ INTRAOCULAR LENS  IMPLANT, BILATERAL Bilateral   . CORONARY ANGIOPLASTY WITH STENT PLACEMENT  08/13/2017  . CORONARY STENT INTERVENTION N/A 08/13/2017   Procedure: CORONARY STENT INTERVENTION;  Surgeon: Burnell Blanks, MD;  Location: [place] [facility];  Service: Cardiovascular;  Laterality: N/A;  . EXCISIONAL HEMORRHOIDECTOMY    . HERNIA REPAIR    . LEFT HEART CATH AND CORONARY ANGIOGRAPHY N/A 08/13/2017   Procedure: LEFT HEART CATH AND CORONARY ANGIOGRAPHY;  Surgeon: Burnell Blanks, MD;  Location: [place] [facility];  Service: Cardiovascular;  Laterality: N/A;  . SHOULDER ARTHROSCOPY WITH ROTATOR CUFF REPAIR Right 10/30/2014   Procedure: SHOULDER ARTHROSCOPY WITH ROTATOR CUFF REPAIR;  Surgeon: Tania Ade, MD;  Location: [place];  Service: Orthopedics;  Laterality: Right;  . SHOULDER ARTHROSCOPY WITH SUBACROMIAL DECOMPRESSION Right 10/30/2014   Procedure: Right shoulder arthroscopy rotator cuff repair, subacromial decompression;  Surgeon: Tania Ade, MD;  Location: [place];  Service: Orthopedics;  Laterality: Right;  Right shoulder arthrscopy rotator cuff repair, subacromial decompression     Current Outpatient Medications  Medication Sig Dispense Refill  . metoprolol tartrate (  LOPRESSOR) 25 MG tablet TAKE ONE-HALF TABLET BY  MOUTH TWO TIMES DAILY 90 tablet 2  . nitroGLYCERIN (NITROSTAT) 0.4 MG SL tablet Place 0.4 mg under the tongue every 5 (five) minutes x 3 doses as needed for chest pain.    Marland Kitchen XARELTO 20 MG TABS tablet TAKE 1 TABLET BY MOUTH  DAILY WITH SUPPER 90 tablet 1   No  current facility-administered medications for this visit.     Allergies:   Feldene [piroxicam]   Social History:  The patient  reports that he quit smoking about 19 years ago. His smoking use included cigarettes. He quit after 27.00 years of use. He has never used smokeless tobacco. He reports that he does not drink alcohol or use drugs.   Family History:  The patient's family history includes Alcohol abuse in his father and mother; Arrhythmia in his mother; Colon cancer in his father; Stroke in his maternal grandfather.   ROS:  Please see the history of present illness.   Otherwise, review of systems is positive for none.   All other systems are reviewed and negative.   PHYSICAL EXAM: VS:  BP 106/72   Pulse 63   Ht 5\' 10"  (1.778 m)   Wt 203 lb 12.8 oz (92.4 kg)   SpO2 96%   BMI 29.24 kg/m  , BMI Body mass index is 29.24 kg/m. GEN: Well nourished, well developed, in no acute distress  HEENT: normal  Neck: no JVD, carotid bruits, or masses Cardiac: RRR; no murmurs, rubs, or gallops,no edema  Respiratory:  clear to auscultation bilaterally, normal work of breathing GI: soft, nontender, nondistended, + BS MS: no deformity or atrophy  Skin: warm and dry Neuro:  Strength and sensation are intact Psych: euthymic mood, full affect  EKG:  EKG is ordered today. Personal review of the ekg ordered shows SR, rate 63   Recent Labs: No results found for requested labs within last 8760 hours.    Lipid Panel     Component Value Date/Time   CHOL 98 11/27/2017 1127   TRIG 39 11/27/2017 1127   HDL 43 11/27/2017 1127   CHOLHDL 2.3 11/27/2017 1127   VLDL 8 11/02/2015 0804   LDLCALC 44 11/27/2017 1127     Wt Readings from Last 3 Encounters:  11/09/18 203 lb 12.8 oz (92.4 kg)  05/14/18 213 lb 1.9 oz (96.7 kg)  05/03/18 214 lb 3.2 oz (97.2 kg)      Other studies Reviewed: Additional studies/ records that were reviewed today include: LHC 08/13/17  Review of the above records today  demonstrates:   Prox RCA lesion is 20% stenosed.  Mid RCA lesion is 30% stenosed.  Prox Cx lesion is 80% stenosed.  A drug-eluting stent was successfully placed using a STENT RESOLUTE ONYX 4.0X18.  Post intervention, there is a 0% residual stenosis.  The left ventricular systolic function is normal.  LV end diastolic pressure is normal.  The left ventricular ejection fraction is greater than 65% by visual estimate.  There is no mitral valve regurgitation.   1. Severe stenosis proximal Circumflex artery 2. Successful PTCA/DES x 1 proximal Circumflex but unfortunately a very small caliber obtuse marginal branch was jailed by the stent and flow was lost down this branch. The branch was 0.5-0.75 mm and too small for a balloon or stent. He had severe chest pain post stent deployment felt to be due to the loss of flow down this small branch.  3. Mild non-obstructive plaque in the RCA 4.  Normal LV systolic function  TTE 0037 - Left ventricle: The cavity size was normal. There was mild concentric hypertrophy. Systolic function was normal. The estimated ejection fraction was in the range of 60% to 65%. Wall motion was normal; there were no regional wall motion abnormalities. - Mitral valve: There was systolic anterior motion. There was mild regurgitation. - Right ventricle: The cavity size was mildly dilated. Wall thickness was normal. - Right atrium: The atrium was mildly dilated.  ASSESSMENT AND PLAN:  1.  Paroxysmal atrial fibrillation: On metoprolol and Xarelto.  Status post ablation 09/17/2017.  Infrequent episodes of palpitations.  No changes.  This patients CHA2DS2-VASc Score and unadjusted Ischemic Stroke Rate (% per year) is equal to 4.8 % stroke rate/year from a score of 4  Above score calculated as 1 point each if present [CHF, HTN, DM, Vascular=MI/PAD/Aortic Plaque, Age if 65-74, or Male] Above score calculated as 2 points each if present [Age > 75, or  Stroke/TIA/TE]   2.  Coronary artery disease: Status post circumflex stent 08/15/2017.  No current chest pain.  Continue Plavix and Xarelto.  Rhetta Cleek restart his atorvastatin.  3.  Obesity: Weight loss encouraged  4.  Obstructive sleep apnea: CPAP compliance encouraged  5. CVA: 2011.  Statin, Plavix, Xarelto  The patient does not have concerns regarding his medicines.  The following changes were made today: Start atorvastatin, aspirin  Labs/ tests ordered today include:  Orders Placed This Encounter  Procedures  . EKG 12-Lead    Disposition:   FU with Tashon Capp 6 months  Signed, Wyoma Genson Meredith Leeds, MD  11/09/2018 2:43 PM     [place] [address] [place] [address] [phone] (office) [phone] (fax)

## 2018-11-10 LAB — CBC
Hematocrit: 43.1 % (ref 37.5–51.0)
Hemoglobin: 14.9 g/dL (ref 13.0–17.7)
MCH: 32.1 pg (ref 26.6–33.0)
MCHC: 34.6 g/dL (ref 31.5–35.7)
MCV: 93 fL (ref 79–97)
Platelets: 275 10*3/uL (ref 150–450)
RBC: 4.64 x10E6/uL (ref 4.14–5.80)
RDW: 12.8 % (ref 11.6–15.4)
WBC: 6.5 10*3/uL (ref 3.4–10.8)

## 2018-11-10 LAB — BASIC METABOLIC PANEL
BUN/Creatinine Ratio: 13 (ref 10–24)
BUN: 12 mg/dL (ref 8–27)
CO2: 25 mmol/L (ref 20–29)
Calcium: 8.9 mg/dL (ref 8.6–10.2)
Chloride: 105 mmol/L (ref 96–106)
Creatinine, Ser: 0.96 mg/dL (ref 0.76–1.27)
GFR calc Af Amer: 94 mL/min/{1.73_m2} (ref >59)
GFR calc non Af Amer: 81 mL/min/{1.73_m2} (ref >59)
Glucose: 85 mg/dL (ref 65–99)
Potassium: 4.9 mmol/L (ref 3.5–5.2)
Sodium: 142 mmol/L (ref 134–144)

## 2019-01-12 ENCOUNTER — Ambulatory Visit: Payer: Medicare Other

## 2019-01-18 ENCOUNTER — Encounter: Payer: Self-pay | Admitting: Physician Assistant

## 2019-01-19 ENCOUNTER — Ambulatory Visit (INDEPENDENT_AMBULATORY_CARE_PROVIDER_SITE_OTHER): Payer: Medicare Other | Admitting: Physician Assistant

## 2019-01-19 ENCOUNTER — Other Ambulatory Visit: Payer: Self-pay

## 2019-01-19 VITALS — BP 122/80 | HR 62 | Ht 70.0 in | Wt 208.0 lb

## 2019-01-19 DIAGNOSIS — H9313 Tinnitus, bilateral: Secondary | ICD-10-CM | POA: Diagnosis not present

## 2019-01-19 DIAGNOSIS — I480 Paroxysmal atrial fibrillation: Secondary | ICD-10-CM

## 2019-01-19 DIAGNOSIS — Z9861 Coronary angioplasty status: Secondary | ICD-10-CM

## 2019-01-19 DIAGNOSIS — Z125 Encounter for screening for malignant neoplasm of prostate: Secondary | ICD-10-CM

## 2019-01-19 DIAGNOSIS — Z23 Encounter for immunization: Secondary | ICD-10-CM | POA: Diagnosis not present

## 2019-01-19 DIAGNOSIS — Z0000 Encounter for general adult medical examination without abnormal findings: Secondary | ICD-10-CM | POA: Diagnosis not present

## 2019-01-19 DIAGNOSIS — M7042 Prepatellar bursitis, left knee: Secondary | ICD-10-CM

## 2019-01-19 DIAGNOSIS — I2510 Atherosclerotic heart disease of native coronary artery without angina pectoris: Secondary | ICD-10-CM

## 2019-01-19 MED ORDER — FLUTICASONE PROPIONATE 50 MCG/ACT NA SUSP: 2.0000 | Freq: Every day | NASAL | 6 refills | Status: DC

## 2019-01-19 NOTE — Patient Instructions (Signed)
Tinnitus Tinnitus refers to hearing a sound when there is no actual source for that sound. This is often described as ringing in the ears. However, people with this condition may hear a variety of noises, in one ear or in both ears. The sounds of tinnitus can be soft, loud, or somewhere in between. Tinnitus can last for a few seconds or can be constant for days. It may go away without treatment and come back at various times. When tinnitus is constant or happens often, it can lead to other problems, such as trouble sleeping and trouble concentrating. Almost everyone experiences tinnitus at some point. Tinnitus that is long-lasting (chronic) or comes back often (recurs) may require medical attention. What are the causes? The cause of tinnitus is often not known. In some cases, it can result from other problems or conditions, including:  Exposure to loud noises from machinery, music, or other sources.  Hearing loss.  Ear or sinus infections.  Earwax buildup.  An object (foreign body) stuck in the ear.  Taking certain medicines.  Drinking alcohol or caffeine.  High blood pressure.  Heart diseases.  Anemia.  Allergies.  Meniere's disease.  Thyroid problems.  Tumors.  A weak, bulging blood vessel (aneurysm) near the ear.  Depression or other mood disorders. What are the signs or symptoms? The main symptom of tinnitus is hearing a sound when there is no source for that sound. It may sound like:  Buzzing.  Roaring.  Ringing.  Blowing air, like the sound heard when you listen to a seashell.  Hissing.  Whistling.  Sizzling.  Humming.  Running water.  A musical note.  Tapping. Symptoms may affect only one ear (unilateral) or both ears (bilateral). How is this diagnosed? Tinnitus is diagnosed based on your symptoms, your medical history, and a physical exam. Your health care provider may do a thorough hearing test (audiologic exam) if your tinnitus:  Is  unilateral.  Causes hearing difficulties.  Lasts 6 months or longer. You may work with a health care provider who specializes in hearing disorders (audiologist). You may be asked questions about your symptoms and how they affect your daily life. You may have other tests done, such as:  CT scan.  MRI.  An imaging test of how blood flows through your blood vessels (angiogram). How is this treated? Treating an underlying medical condition can sometimes make tinnitus go away. If your tinnitus continues, other treatments may include:  Medicines, such as antidepressants or sleeping aids.  Sound generators to mask the tinnitus. These include: ? Tabletop sound machines that play relaxing sounds to help you fall asleep. ? Wearable devices that fit in your ear and play sounds or music. ? Acoustic neural stimulation. This involves using headphones to listen to music that contains an auditory signal. Over time, listening to this signal may change some pathways in your brain and make you less sensitive to tinnitus. This treatment is used for very severe cases when no other treatment is working.  Therapy and counseling to help you manage the stress of living with tinnitus.  Using hearing aids or cochlear implants if your tinnitus is related to hearing loss. Hearing aids are worn in the outer ear. Cochlear implants are surgically placed in the inner ear. Follow these instructions at home: Managing symptoms      When possible, avoid being in loud places and being exposed to loud sounds.  Wear hearing protection, such as earplugs, when you are exposed to loud noises.  Use   a white noise machine, a humidifier, or other devices to mask the sound of tinnitus.  Practice techniques for reducing stress, such as meditation, yoga, or deep breathing. Work with your health care provider if you need help with managing stress.  Sleep with your head slightly raised. This may reduce the impact of tinnitus.  General instructions  Do not use stimulants, such as nicotine, alcohol, or caffeine. Talk with your health care provider about other stimulants to avoid. Stimulants are substances that can make you feel alert and attentive by increasing certain activities in the body (such as heart rate and blood pressure). These substances may make tinnitus worse.  Take over-the-counter and prescription medicines only as told by your health care provider.  Try to get plenty of sleep each night.  Keep all follow-up visits as told by your health care provider. This is important. Contact a health care provider if:  Your tinnitus continues for 3 weeks or longer without stopping.  Your symptoms get worse or do not get better with home care.  You develop tinnitus after a head injury.  You have tinnitus along with any of the following: ? Dizziness. ? Loss of balance. ? Nausea and vomiting. Summary  Tinnitus refers to hearing a sound when there is no actual source for that sound. This is often described as ringing in the ears.  Symptoms may affect only one ear (unilateral) or both ears (bilateral).  Use a white noise machine, a humidifier, or other devices to mask the sound of tinnitus.  Do not use stimulants, such as nicotine, alcohol, or caffeine. Talk with your health care provider about other stimulants to avoid. These substances may make tinnitus worse. This information is not intended to replace advice given to you by your health care provider. Make sure you discuss any questions you have with your health care provider. Document Released: 03/10/2005 Document Revised: 02/20/2017 Document Reviewed: 12/18/2016 Elsevier Patient Education  2020 [employer]  

## 2019-01-19 NOTE — Progress Notes (Signed)
Subjective:    Patient ID: Stephen King, male    DOB: 18-Mar-1951, 68 y.o.   MRN: [id number]  HPI  Pt is a 68 yo male with A.fib, CAD, hx of MI, HTN who presents to the clinic for annual exam.   Hx of hemorrhagic prepatellar bursitis with Dr. Darene Lamer draining. He has noticed his left knee swelling back up. No pain, redness, or warmth.   He has also been having constant high pitch ringing in both ears for last month. Not noticed any hearing loss, vertigo or nausea. He has been sneezing more. No new prescription medication but taking more tums OTC.  .. Active Ambulatory Problems    Diagnosis Date Noted  . PARESTHESIA 12/24/2010  . HEAD TRAUMA, CLOSED 12/24/2010  . 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 Urgency of urination 08/26/2011  . Mass of right side of neck 06/06/2013  . Chest pain 08/21/2013  . Atrial fibrillation with RVR ([place]) 08/21/2013  . 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 PAF (paroxysmal atrial fibrillation) ([place]) 08/29/2013  . Atypical nevus 08/29/2013  . History of CVA (cerebrovascular accident) 10/06/2013  . Chronic anticoagulation 10/06/2013  . Primary osteoarthritis of both knees 10/07/2013  . 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 Right shoulder pain 11/04/2013  . Acute bronchitis 10/19/2014  . Pre-operative cardiovascular examination 10/27/2014  . left knee pain 10/26/2015  . Multiple joint pain 03/10/2016  . Weakness 03/10/2016  . Myalgia 03/10/2016  . No energy 08/11/2016  . Malaise 08/11/2016  . Atrial flutter ([place]) 07/28/2017  . Unstable angina ([place])   . CAD S/P percutaneous coronary angioplasty 08/14/2017  . Atrial fibrillation ([place]) 08/16/2017  . OSA (obstructive sleep apnea) 11/17/2017  . Hemorrhagic prepatellar bursitis of left knee 11/17/2017  . Prepatellar bursitis of left knee 01/21/2019  . 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   Resolved Ambulatory Problems    Diagnosis Date Noted  . Osteoarthritis of left knee 10/26/2015  . Paroxysmal atrial fibrillation  ([place]) 09/17/2017   Past Medical History:  Diagnosis Date  . Arthritis   . Dysrhythmia   . Hernia, umbilical   . History of hiatal hernia   . History of kidney stones   . Stroke ([place]) 12/2009   .Stephen Kitchen Family History  Problem Relation Age of Onset  . Alcohol abuse Mother   . Arrhythmia Mother   . Colon cancer Father   . Alcohol abuse Father   . Stroke Maternal Grandfather    .Stephen Kitchen Social History   Socioeconomic History  . Marital status: Married    Spouse name: Not on file  . Number of children: Not on file  . Years of education: Not on file  . Highest education level: Not on file  Occupational History  . Not on file  Social Needs  . Financial resource strain: Not on file  . Food insecurity    Worry: Not on file    Inability: Not on file  . Transportation needs    Medical: Not on file    Non-medical: Not on file  Tobacco Use  . Smoking status: Former Smoker    Years: 27.00    Types: Cigarettes    Quit date: 03/25/1999    Years since quitting: 19.8  . Smokeless tobacco: Never Used  Substance and Sexual Activity  . Alcohol use: No  . Drug use: No  . Sexual activity: Not Currently  Lifestyle  . Physical activity    Days per week: Not on file    Minutes per session:  Not on file  . Stress: Not on file  Relationships  . Social Herbalist on phone: Not on file    Gets together: Not on file    Attends religious service: Not on file    Active member of club or organization: Not on file    Attends meetings of clubs or organizations: Not on file    Relationship status: Not on file  . Intimate partner violence    Fear of current or ex partner: Not on file    Emotionally abused: Not on file    Physically abused: Not on file    Forced sexual activity: Not on file  Other Topics Concern  . Not on file  Social History Narrative  . Not on file      Review of Systems  All other systems reviewed and are negative.      Objective:   Physical Exam BP 122/80    Pulse 62   Ht 5\' 10"  (1.778 m)   Wt 208 lb (94.3 kg)   SpO2 99%   BMI 29.84 kg/m   General Appearance:    Alert, cooperative, no distress, appears stated age  Head:    Normocephalic, without obvious abnormality, atraumatic  Eyes:    PERRL, conjunctiva/corneas clear, EOM's intact, fundi    benign, both eyes       Ears:    Normal TM's and external ear canals, both ears  Nose:   Nares normal, septum midline, mucosa normal, no drainage    or sinus tenderness  Throat:   Lips, mucosa, and tongue normal; teeth and gums normal  Neck:   Supple, symmetrical, trachea midline, no adenopathy;       thyroid:  No enlargement/tenderness/nodules; no carotid   bruit or JVD  Back:     Symmetric, no curvature, ROM normal, no CVA tenderness  Lungs:     Clear to auscultation bilaterally, respirations unlabored  Chest wall:    No tenderness or deformity  Heart:    Regular rate and rhythm, S1 and S2 normal, no murmur, rub   or gallop  Abdomen:     Soft, non-tender, bowel sounds active all four quadrants,    no masses, no organomegaly        Extremities:   Extremities normal, atraumatic, no cyanosis or edema.left prepatallar bursa swollen. Non tender and not warm to touch or red.   Pulses:   2+ and symmetric all extremities  Skin:   Skin color, texture, turgor normal, no rashes or lesions  Lymph nodes:   Cervical, supraclavicular, and axillary nodes normal  Neurologic:   CNII-XII intact. Normal strength, sensation and reflexes      throughout         Assessment & Plan:  .Stephen King was seen today for annual exam.  Diagnoses and all orders for this visit:  Routine physical examination -     Lipid Panel w/reflex Direct LDL  Flu vaccine need -     Flu Vaccine QUAD High Dose(Fluad)  Tinnitus of both ears -     TSH -     Lipid Panel w/reflex Direct LDL -     PSA -     fluticasone (FLONASE) 50 MCG/ACT nasal spray; Place 2 sprays into both nostrils daily.  Coronary artery disease involving native  heart without angina pectoris, unspecified vessel or lesion type -     Lipid Panel w/reflex Direct LDL  Prostate cancer screening -     PSA  Prepatellar bursitis of left knee  CAD S/P percutaneous coronary angioplasty  PAF (paroxysmal atrial fibrillation) (H[facility])   .Stephen Kitchen Depression screen [facility] 2/9 01/19/2019 11/27/2017 07/28/2017  Decreased Interest 0 1 1  Down, Depressed, Hopeless 0 0 1  PHQ - 2 Score 0 1 2  Altered sleeping 1 - 1  Tired, decreased energy 1 - 2  Change in appetite 0 - 1  Feeling bad or failure about yourself  0 - 0  Trouble concentrating 0 - 1  Moving slowly or fidgety/restless 0 - 2  Suicidal thoughts 0 - 0  PHQ-9 Score 2 - 9  Difficult doing work/chores Not difficult at all - Somewhat difficult   .Stephen KitchenStart a regular exercise program and make sure you are eating a healthy diet Try to eat 4 servings of dairy a day or take a calcium supplement (500mg  twice a day). Your vaccines are up to date.  Flu shot given today.  Fasting labs ordered.  Colonoscopy UTD.  PSA ordered.  .. Council Name 01/19/19 1100         During the last Month   Sensation of Bladder not Empty  Not at all     Urinate<2 hours after last  Not at all     Mult. stop/start when voiding  Not at all     Difficult to postpone voiding  Not at all     Weak urinary stream  Less than 1 time in 5     Push/strain to begin urination  Not at all     Times per night up to urinate  Almost Always       OTHER   Total Score  6       Bursitis of left knee but no pain/swelling/erythema. Follow up with Dr. Darene Lamer to drain accordingly.   Unclear etiology of tinnitus. Discussed causes. No recent medication changes except taking tums more. Stop tums just to make sure. Ear canals and TMs look good. Gave flonase and discussed zyrtec for a few days to see if would help. Hearing checked in office and no significant hearing loss but low and high pitched frequencies were lost in right ear and just low frequency  in left ear. Follow up in 1 month. Normal neuro exam. No vertigo/nausea.

## 2019-01-21 ENCOUNTER — Encounter: Payer: Self-pay | Admitting: Physician Assistant

## 2019-01-21 DIAGNOSIS — H9313 Tinnitus, bilateral: Secondary | ICD-10-CM | POA: Insufficient documentation

## 2019-01-21 DIAGNOSIS — M7042 Prepatellar bursitis, left knee: Secondary | ICD-10-CM | POA: Insufficient documentation

## 2019-03-22 ENCOUNTER — Other Ambulatory Visit: Payer: Self-pay | Admitting: Cardiology

## 2019-03-22 ENCOUNTER — Encounter: Payer: Self-pay | Admitting: Physician Assistant

## 2019-03-22 DIAGNOSIS — H9313 Tinnitus, bilateral: Secondary | ICD-10-CM

## 2019-03-22 NOTE — Telephone Encounter (Signed)
Xarelto 20mg  refill request received. Pt is 68 years old, weight-94.3kg, Crea-0.96 on 11/09/2018, last seen by Dr. Curt Bears on 11/09/2018, Diagnosis-Afib, CrCl-99.70ml/min; Dose is appropriate based on dosing criteria. Will send in refill to requested pharmacy.

## 2019-03-23 MED ORDER — METHYLPREDNISOLONE 4 MG PO TBPK: ORAL_TABLET | ORAL | 0 refills | Status: DC

## 2019-04-05 DIAGNOSIS — H903 Sensorineural hearing loss, bilateral: Secondary | ICD-10-CM | POA: Diagnosis not present

## 2019-04-05 DIAGNOSIS — H9313 Tinnitus, bilateral: Secondary | ICD-10-CM | POA: Diagnosis not present

## 2019-05-27 ENCOUNTER — Other Ambulatory Visit: Payer: Self-pay | Admitting: Cardiology

## 2019-06-15 ENCOUNTER — Other Ambulatory Visit: Payer: Self-pay | Admitting: Cardiology

## 2019-06-16 ENCOUNTER — Other Ambulatory Visit (HOSPITAL_COMMUNITY)
Admission: RE | Admit: 2019-06-16 | Discharge: 2019-06-16 | Disposition: A | Discharge status: Home / Self Care | Payer: Medicare Other | Source: Ambulatory Visit | Attending: Interventional Cardiology | Admitting: Interventional Cardiology

## 2019-06-16 ENCOUNTER — Ambulatory Visit: Payer: Medicare Other | Admitting: Cardiology

## 2019-06-16 ENCOUNTER — Encounter: Payer: Self-pay | Admitting: Cardiology

## 2019-06-16 ENCOUNTER — Other Ambulatory Visit: Payer: Self-pay

## 2019-06-16 VITALS — BP 112/64 | HR 64 | Resp 15 | Ht 70.0 in | Wt 214.4 lb

## 2019-06-16 DIAGNOSIS — Z20822 Contact with and (suspected) exposure to covid-19: Secondary | ICD-10-CM | POA: Diagnosis not present

## 2019-06-16 DIAGNOSIS — R5383 Other fatigue: Secondary | ICD-10-CM | POA: Diagnosis not present

## 2019-06-16 DIAGNOSIS — I480 Paroxysmal atrial fibrillation: Secondary | ICD-10-CM | POA: Diagnosis not present

## 2019-06-16 DIAGNOSIS — Z01812 Encounter for preprocedural laboratory examination: Secondary | ICD-10-CM | POA: Diagnosis not present

## 2019-06-16 DIAGNOSIS — I209 Angina pectoris, unspecified: Secondary | ICD-10-CM

## 2019-06-16 DIAGNOSIS — Z9889 Other specified postprocedural states: Secondary | ICD-10-CM

## 2019-06-16 DIAGNOSIS — Z79899 Other long term (current) drug therapy: Secondary | ICD-10-CM | POA: Diagnosis not present

## 2019-06-16 DIAGNOSIS — Z8679 Personal history of other diseases of the circulatory system: Secondary | ICD-10-CM

## 2019-06-16 DIAGNOSIS — I2510 Atherosclerotic heart disease of native coronary artery without angina pectoris: Secondary | ICD-10-CM | POA: Diagnosis not present

## 2019-06-16 LAB — LIPID PANEL
Chol/HDL Ratio: 2.2 ratio (ref 0.0–5.0)
Cholesterol, Total: 97 mg/dL — ABNORMAL LOW (ref 100–199)
HDL: 45 mg/dL (ref >39)
LDL Chol Calc (NIH): 36 mg/dL (ref 0–99)
Triglycerides: 79 mg/dL (ref 0–149)
VLDL Cholesterol Cal: 16 mg/dL (ref 5–40)

## 2019-06-16 LAB — COMPREHENSIVE METABOLIC PANEL
ALT: 19 IU/L (ref 0–44)
AST: 16 IU/L (ref 0–40)
Albumin/Globulin Ratio: 2.1 (ref 1.2–2.2)
Albumin: 4 g/dL (ref 3.8–4.8)
Alkaline Phosphatase: 94 IU/L (ref 39–117)
BUN/Creatinine Ratio: 12 (ref 10–24)
BUN: 10 mg/dL (ref 8–27)
Bilirubin Total: 0.8 mg/dL (ref 0.0–1.2)
CO2: 26 mmol/L (ref 20–29)
Calcium: 9 mg/dL (ref 8.6–10.2)
Chloride: 104 mmol/L (ref 96–106)
Creatinine, Ser: 0.85 mg/dL (ref 0.76–1.27)
GFR calc Af Amer: 104 mL/min/{1.73_m2} (ref >59)
GFR calc non Af Amer: 90 mL/min/{1.73_m2} (ref >59)
Globulin, Total: 1.9 g/dL (ref 1.5–4.5)
Glucose: 117 mg/dL — ABNORMAL HIGH (ref 65–99)
Potassium: 4.1 mmol/L (ref 3.5–5.2)
Sodium: 141 mmol/L (ref 134–144)
Total Protein: 5.9 g/dL — ABNORMAL LOW (ref 6.0–8.5)

## 2019-06-16 LAB — TSH: TSH: 1.02 u[IU]/mL (ref 0.450–4.500)

## 2019-06-16 LAB — CBC
Hematocrit: 43 % (ref 37.5–51.0)
Hemoglobin: 15.3 g/dL (ref 13.0–17.7)
MCH: 33.1 pg — ABNORMAL HIGH (ref 26.6–33.0)
MCHC: 35.6 g/dL (ref 31.5–35.7)
MCV: 93 fL (ref 79–97)
Platelets: 257 10*3/uL (ref 150–450)
RBC: 4.62 x10E6/uL (ref 4.14–5.80)
RDW: 12.5 % (ref 11.6–15.4)
WBC: 7.6 10*3/uL (ref 3.4–10.8)

## 2019-06-16 LAB — T4, FREE: Free T4: 1.28 ng/dL (ref 0.82–1.77)

## 2019-06-16 LAB — SARS CORONAVIRUS 2 (TAT 6-24 HRS): SARS Coronavirus 2: NEGATIVE

## 2019-06-16 MED ORDER — SODIUM CHLORIDE 0.9% FLUSH: 3.0000 mL | Freq: Two times a day (BID) | INTRAVENOUS | Status: DC

## 2019-06-16 NOTE — Patient Instructions (Addendum)
Medication Instructions:  Please discontinue your Metoprolol.  Continue all other medications as listed.  *If you need a refill on your cardiac medications before your next appointment, please call your pharmacy*   Lab Work: Please have blood work today (CBC, CMP, Lipid, TSH, Free T4)  If you have labs (blood work) drawn today and your tests are completely normal, you will receive your results only by: Marland Kitchen MyChart Message (if you have MyChart) OR . A paper copy in the mail If you have any lab test that is abnormal or we need to change your treatment, we will call you to review the results.   Testing/Procedures: Your physician has requested that you have a cardiac catheterization. Cardiac catheterization is used to diagnose and/or treat various heart conditions. Doctors may recommend this procedure for a number of different reasons. The most common reason is to evaluate chest pain. Chest pain can be a symptom of coronary artery disease (CAD), and cardiac catheterization can show whether plaque is narrowing or blocking your heart's arteries. This procedure is also used to evaluate the valves, as well as measure the blood flow and oxygen levels in different parts of your heart. For further information please visit [email or web address]. Please follow instruction sheet, as given.  Follow-Up: At [facility], you and your health needs are our priority.  As part of our continuing mission to provide you with exceptional heart care, we have created designated Provider Care Teams.  These Care Teams include your primary Cardiologist (physician) and Advanced Practice Providers (APPs -  Physician Assistants and Nurse Practitioners) who all work together to provide you with the care you need, when you need it.  We recommend signing up for the patient portal called "MyChart".  Sign up information is provided on this After Visit Summary.  MyChart is used to connect with patients for Virtual Visits  (Telemedicine).  Patients are able to view lab/test results, encounter notes, upcoming appointments, etc.  Non-urgent messages can be sent to your provider as well.   To learn more about what you can do with MyChart, go to [email or web address].    Your next appointment:   4 week(s)  The format for your next appointment:   In Person  Provider:   Candee Furbish, MD   Thank you for choosing [facility]!!       [place] OFFICE [place], [place] [address] Dept: [phone] Loc: [phone]  Stephen King  06/16/2019  You are scheduled for a  Cardiac Cath on  Monday, March 29,2021  with Dr. Irish Lack.  1. Please arrive at the [facility] (Main Entrance A) at [facility]: [address] [place], [place] [address] at  5:30 AM  (two hours before your procedure to ensure your preparation). Free valet parking service is available.   Special note: Every effort is made to have your procedure done on time. Please understand that emergencies sometimes delay scheduled procedures.  2. Diet: Nothing after midnight the night before.  3. Labs: please have blood work today.  Have COVID screening at he [facility] location as scheduled.  4. Medication instructions in preparation for your procedure:  Hold Xarelto the day before and morning of.  On the morning of your procedure, you may take your other medications as listed.  You may use sips of water.  5. Plan for one night stay--bring personal belongings. 6. Bring a current list of your  medications and current insurance cards. 7. You MUST have a responsible person to drive you home. 8. Someone MUST be with you the first 24 hours after you arrive home or your discharge will be delayed. 9. Please wear clothes that are easy to get on and off and wear slip-on shoes.  Thank you for allowing [place] to care for you!   -- [facility]

## 2019-06-16 NOTE — H&P (View-Only) (Signed)
Cardiology Office Note:    Date:  06/16/2019   ID:  Stephen King, DOB 06/19/1950, MRN [id number]  PCP:  Donella Stade, PA-C  Cardiologist:  Candee Furbish, MD  Electrophysiologist:  Constance Haw, MD   Referring MD: Donella Stade, PA-C     History of Present Illness:    Stephen King is a 69 y.o. male here for the follow-up of paroxysmal atrial fibrillation, stroke 2011, on Xarelto.  Has CAD post circumflex stent in 2019.  Recently had more frequent episodes of atrial fibrillation.  Prior unsuccessful cardioversion.  Had ablation 09/17/2017, Dr. Curt Bears.  Feels like punched in chest so "damn hard".  Has been trying to hydrate with Gatoraid  2 weeks ago Pickle ball tightness, pain in chest. Tried Monday. Pain back. No energy. Vision "messed up", blurry. BP 122/64, Yesterday 100/60. Mild cough in the morning.   Vaccinated mid Jan Covid.   [place] one week ago. Messy gooey stool.   Past Medical History:  Diagnosis Date  . Arthritis    "?right knee" (08/13/2017)  . Dysrhythmia    PAF  . Hernia, umbilical   . History of hiatal hernia   . History of kidney stones   . Stroke [facility]) 12/2009   "has to watch where he's walking since; more of a spacial thing resulting from stroke" (08/13/2017)    Past Surgical History:  Procedure Laterality Date  . ABDOMINAL HERNIA REPAIR     "has a mesh in his abdomen"  . ANTERIOR CERVICAL DECOMP/DISCECTOMY FUSION  10/2005   C6-7  . ATRIAL FIBRILLATION ABLATION N/A 09/17/2017   Procedure: ATRIAL FIBRILLATION ABLATION;  Surgeon: Constance Haw, MD;  Location: [place] [facility];  Service: Cardiovascular;  Laterality: N/A;  . BACK SURGERY    . CARDIAC CATHETERIZATION  2003  . CATARACT EXTRACTION W/ INTRAOCULAR LENS  IMPLANT, BILATERAL Bilateral   . CORONARY ANGIOPLASTY WITH STENT PLACEMENT  08/13/2017  . CORONARY STENT INTERVENTION N/A 08/13/2017   Procedure: CORONARY STENT INTERVENTION;  Surgeon: Burnell Blanks, MD;   Location: [place] [facility];  Service: Cardiovascular;  Laterality: N/A;  . EXCISIONAL HEMORRHOIDECTOMY    . HERNIA REPAIR    . LEFT HEART CATH AND CORONARY ANGIOGRAPHY N/A 08/13/2017   Procedure: LEFT HEART CATH AND CORONARY ANGIOGRAPHY;  Surgeon: Burnell Blanks, MD;  Location: [place] [facility];  Service: Cardiovascular;  Laterality: N/A;  . SHOULDER ARTHROSCOPY WITH ROTATOR CUFF REPAIR Right 10/30/2014   Procedure: SHOULDER ARTHROSCOPY WITH ROTATOR CUFF REPAIR;  Surgeon: Tania Ade, MD;  Location: [place];  Service: Orthopedics;  Laterality: Right;  . SHOULDER ARTHROSCOPY WITH SUBACROMIAL DECOMPRESSION Right 10/30/2014   Procedure: Right shoulder arthroscopy rotator cuff repair, subacromial decompression;  Surgeon: Tania Ade, MD;  Location: [place];  Service: Orthopedics;  Laterality: Right;  Right shoulder arthrscopy rotator cuff repair, subacromial decompression    Current Medications: Current Meds  Medication Sig  . atorvastatin (LIPITOR) 80 MG tablet TAKE 1 TABLET BY MOUTH  DAILY  . fluticasone (FLONASE) 50 MCG/ACT nasal spray Place 2 sprays into both nostrils daily.  . nitroGLYCERIN (NITROSTAT) 0.4 MG SL tablet Place 0.4 mg under the tongue every 5 (five) minutes x 3 doses as needed for chest pain.  Marland Kitchen XARELTO 20 MG TABS tablet TAKE 1 TABLET BY MOUTH  DAILY WITH SUPPER  . [DISCONTINUED] metoprolol tartrate (LOPRESSOR) 25 MG tablet TAKE ONE-HALF TABLET BY  MOUTH TWICE DAILY   Current Facility-Administered Medications for  the 06/16/19 encounter (Office Visit) with Jerline Pain, MD  Medication  . sodium chloride flush (NS) 0.9 % injection 3 mL     Allergies:   Feldene [piroxicam]   Social History   Socioeconomic History  . Marital status: Married    Spouse name: Not on file  . Number of children: Not on file  . Years of education: Not on file  . Highest education level: Not on file  Occupational History  . Not on file    Tobacco Use  . Smoking status: Former Smoker    Years: 27.00    Types: Cigarettes    Quit date: 03/25/1999    Years since quitting: 20.2  . Smokeless tobacco: Never Used  Substance and Sexual Activity  . Alcohol use: No  . Drug use: No  . Sexual activity: Not Currently  Other Topics Concern  . Not on file  Social History Narrative  . Not on file   Social Determinants of Health   Financial Resource Strain:   . Difficulty of Paying Living Expenses:   Food Insecurity:   . Worried About Charity fundraiser in the Last Year:   . Arboriculturist in the Last Year:   Transportation Needs:   . Film/video editor (Medical):   Marland Kitchen Lack of Transportation (Non-Medical):   Physical Activity:   . Days of Exercise per Week:   . Minutes of Exercise per Session:   Stress:   . Feeling of Stress :   Social Connections:   . Frequency of Communication with Friends and Family:   . Frequency of Social Gatherings with Friends and Family:   . Attends Religious Services:   . Active Member of Clubs or Organizations:   . Attends Archivist Meetings:   Marland Kitchen Marital Status:      Family History: The patient's family history includes Alcohol abuse in his father and mother; Arrhythmia in his mother; Colon cancer in his father; Stroke in his maternal grandfather.  ROS:   Please see the history of present illness.    No fevers, no syncope, no orthopnea.  All other systems reviewed and are negative.  EKGs/Labs/Other Studies Reviewed:     Cath 08/13/17: The following studies were reviewed today:   Prox RCA lesion is 20% stenosed.  Mid RCA lesion is 30% stenosed.  Prox Cx lesion is 80% stenosed.  A drug-eluting stent was successfully placed using a STENT RESOLUTE ONYX 4.0X18.  Post intervention, there is a 0% residual stenosis.  The left ventricular systolic function is normal.  LV end diastolic pressure is normal.  The left ventricular ejection fraction is greater than 65% by  visual estimate.  There is no mitral valve regurgitation.   1. Severe stenosis proximal Circumflex artery 2. Successful PTCA/DES x 1 proximal Circumflex but unfortunately a very small caliber obtuse marginal branch was jailed by the stent and flow was lost down this branch. The branch was 0.5-0.75 mm and too small for a balloon or stent. He had severe chest pain post stent deployment felt to be due to the loss of flow down this small branch.  3. Mild non-obstructive plaque in the RCA 4. Normal LV systolic function  Recommendations: Continue triple therapy with ASA/Plavix/Xarelto for one month.  Stop ASA in one month. In one year, consider stopping Plavix and restarting ASA along with Xarelto. Start high intensity statin. Continue beta blocker.   EKG:  EKG is  ordered today.  The ekg ordered  today demonstrates sinus rhythm with nonspecific ST-T wave changes  Recent Labs: 11/09/2018: BUN 12; Creatinine, Ser 0.96; Hemoglobin 14.9; Platelets 275; Potassium 4.9; Sodium 142  Recent Lipid Panel    Component Value Date/Time   CHOL 98 11/27/2017 1127   TRIG 39 11/27/2017 1127   HDL 43 11/27/2017 1127   CHOLHDL 2.3 11/27/2017 1127   VLDL 8 11/02/2015 0804   LDLCALC 44 11/27/2017 1127    Physical Exam:    VS:  BP 112/64   Pulse 64   Resp 15   Ht 5\' 10"  (1.778 m)   Wt 214 lb 6.4 oz (97.3 kg)   SpO2 97%   BMI 30.76 kg/m     Wt Readings from Last 3 Encounters:  06/16/19 214 lb 6.4 oz (97.3 kg)  01/19/19 208 lb (94.3 kg)  11/09/18 203 lb 12.8 oz (92.4 kg)     GEN: Tired appearing well nourished, well developed in no acute distress HEENT: Normal NECK: No JVD; No carotid bruits LYMPHATICS: No lymphadenopathy CARDIAC: RRR, no murmurs, rubs, gallops RESPIRATORY:  Clear to auscultation without rales, wheezing or rhonchi  ABDOMEN: Soft, non-tender, non-distended MUSCULOSKELETAL:  No edema; No deformity  SKIN: Warm and dry NEUROLOGIC:  Alert and oriented x 3 PSYCHIATRIC:  Normal affect    ASSESSMENT:    1. Coronary artery disease involving native coronary artery of native heart without angina pectoris   2. Paroxysmal atrial fibrillation (HCC)   3. S/P ablation of atrial fibrillation   4. Angina pectoris ([place])   5. Pre-procedure lab exam   6. Fatigue, unspecified type   7. Medication management    PLAN:    In order of problems listed above:  Coronary artery disease status post circumflex stent now with anginal symptoms extreme fatigue weakness blurry vision at times hypotension -We will go ahead and check lab work, CBC complete metabolic profile, lipid panel, TSH, free T4. -Given his exertional anginal component, we will get him set up for cardiac catheterization.  Risk and benefits stroke heart attack death renal impairment bleeding.  Willing to proceed.  Paroxysmal atrial fibrillation -Post ablation.  Was doing quite well.  EKG today shows sinus rhythm 60 with no other abnormalities.  Chronic anticoagulation -On Xarelto.  Could his weakness be secondary to anemia.  Last hemoglobin in August 2020 was 14.9.  He did state that he had some "messed up "stools in the trailer in [place] when he was visiting.  Weakness/hypotension -I think it makes sense for [place] to discontinue his metoprolol 25 mg twice a day.  Blood pressure fairly soft.  Once again checking for anemia.  I explained to him there could be several other reasons for his current constellation of symptoms.  1 week follow-up post cath   Medication Adjustments/Labs and Tests Ordered: Current medicines are reviewed at length with the patient today.  Concerns regarding medicines are outlined above.  Orders Placed This Encounter  Procedures  . Comprehensive metabolic panel  . CBC  . Lipid panel  . TSH  . T4, free   Meds ordered this encounter  Medications  . sodium chloride flush (NS) 0.9 % injection 3 mL    Patient Instructions  Medication Instructions:  Please discontinue your Metoprolol.  Continue  all other medications as listed.  *If you need a refill on your cardiac medications before your next appointment, please call your pharmacy*   Lab Work: Please have blood work today (CBC, CMP, Lipid, TSH, Free T4)  If you have labs (blood  work) drawn today and your tests are completely normal, you will receive your results only by: Marland Kitchen MyChart Message (if you have MyChart) OR . A paper copy in the mail If you have any lab test that is abnormal or we need to change your treatment, we will call you to review the results.   Testing/Procedures: Your physician has requested that you have a cardiac catheterization. Cardiac catheterization is used to diagnose and/or treat various heart conditions. Doctors may recommend this procedure for a number of different reasons. The most common reason is to evaluate chest pain. Chest pain can be a symptom of coronary artery disease (CAD), and cardiac catheterization can show whether plaque is narrowing or blocking your heart's arteries. This procedure is also used to evaluate the valves, as well as measure the blood flow and oxygen levels in different parts of your heart. For further information please visit [email or web address]. Please follow instruction sheet, as given.  Follow-Up: At [facility], you and your health needs are our priority.  As part of our continuing mission to provide you with exceptional heart care, we have created designated Provider Care Teams.  These Care Teams include your primary Cardiologist (physician) and Advanced Practice Providers (APPs -  Physician Assistants and Nurse Practitioners) who all work together to provide you with the care you need, when you need it.  We recommend signing up for the patient portal called "MyChart".  Sign up information is provided on this After Visit Summary.  MyChart is used to connect with patients for Virtual Visits (Telemedicine).  Patients are able to view lab/test results, encounter notes, upcoming  appointments, etc.  Non-urgent messages can be sent to your provider as well.   To learn more about what you can do with MyChart, go to [email or web address].    Your next appointment:   4 week(s)  The format for your next appointment:   In Person  Provider:   Candee Furbish, MD   Thank you for choosing [facility]!!       [place] OFFICE [place], [place] [address] Dept: [phone] Loc: [phone]  Stephen King  06/16/2019  You are scheduled for a  Cardiac Cath on  Monday, March 29,2021  with Dr. Irish Lack.  1. Please arrive at the [facility] (Main Entrance A) at [facility]: [address] [place],  [address] at  5:30 AM  (two hours before your procedure to ensure your preparation). Free valet parking service is available.   Special note: Every effort is made to have your procedure done on time. Please understand that emergencies sometimes delay scheduled procedures.  2. Diet: Nothing after midnight the night before.  3. Labs: please have blood work today.  Have COVID screening at he [facility] location as scheduled.  4. Medication instructions in preparation for your procedure:  Hold Xarelto the day before and morning of.  On the morning of your procedure, you may take your other medications as listed.  You may use sips of water.  5. Plan for one night stay--bring personal belongings. 6. Bring a current list of your medications and current insurance cards. 7. You MUST have a responsible person to drive you home. 8. Someone MUST be with you the first 24 hours after you arrive home or your discharge will be delayed. 9. Please wear clothes that are easy to get on and off and wear slip-on shoes.  Thank you for allowing [place] to care for you!   -- [facility] In[facility] Cardiovascular services     Signed, Candee Furbish, MD  06/16/2019 1:19  PM    [place]

## 2019-06-16 NOTE — Progress Notes (Signed)
Cardiology Office Note:    Date:  06/16/2019   ID:  Stephen King, DOB April 30, 1950, MRN [id number]  PCP:  Donella Stade, PA-C  Cardiologist:  Candee Furbish, MD  Electrophysiologist:  Constance Haw, MD   Referring MD: Donella Stade, PA-C     History of Present Illness:    Stephen King is a 69 y.o. male here for the follow-up of paroxysmal atrial fibrillation, stroke 2011, on Xarelto.  Has CAD post circumflex stent in 2019.  Recently had more frequent episodes of atrial fibrillation.  Prior unsuccessful cardioversion.  Had ablation 09/17/2017, Dr. Curt Bears.  Feels like punched in chest so "damn hard".  Has been trying to hydrate with Gatoraid  2 weeks ago Pickle ball tightness, pain in chest. Tried Monday. Pain back. No energy. Vision "messed up", blurry. BP 122/64, Yesterday 100/60. Mild cough in the morning.   Vaccinated mid Jan Covid.   [place] one week ago. Messy gooey stool.   Past Medical History:  Diagnosis Date  . Arthritis    "?right knee" (08/13/2017)  . Dysrhythmia    PAF  . Hernia, umbilical   . History of hiatal hernia   . History of kidney stones   . Stroke [facility]) 12/2009   "has to watch where he's walking since; more of a spacial thing resulting from stroke" (08/13/2017)    Past Surgical History:  Procedure Laterality Date  . ABDOMINAL HERNIA REPAIR     "has a mesh in his abdomen"  . ANTERIOR CERVICAL DECOMP/DISCECTOMY FUSION  10/2005   C6-7  . ATRIAL FIBRILLATION ABLATION N/A 09/17/2017   Procedure: ATRIAL FIBRILLATION ABLATION;  Surgeon: Constance Haw, MD;  Location: [place] [facility];  Service: Cardiovascular;  Laterality: N/A;  . BACK SURGERY    . CARDIAC CATHETERIZATION  2003  . CATARACT EXTRACTION W/ INTRAOCULAR LENS  IMPLANT, BILATERAL Bilateral   . CORONARY ANGIOPLASTY WITH STENT PLACEMENT  08/13/2017  . CORONARY STENT INTERVENTION N/A 08/13/2017   Procedure: CORONARY STENT INTERVENTION;  Surgeon: Burnell Blanks, MD;   Location: [place] [facility];  Service: Cardiovascular;  Laterality: N/A;  . EXCISIONAL HEMORRHOIDECTOMY    . HERNIA REPAIR    . LEFT HEART CATH AND CORONARY ANGIOGRAPHY N/A 08/13/2017   Procedure: LEFT HEART CATH AND CORONARY ANGIOGRAPHY;  Surgeon: Burnell Blanks, MD;  Location: [place] [facility];  Service: Cardiovascular;  Laterality: N/A;  . SHOULDER ARTHROSCOPY WITH ROTATOR CUFF REPAIR Right 10/30/2014   Procedure: SHOULDER ARTHROSCOPY WITH ROTATOR CUFF REPAIR;  Surgeon: Tania Ade, MD;  Location: [place];  Service: Orthopedics;  Laterality: Right;  . SHOULDER ARTHROSCOPY WITH SUBACROMIAL DECOMPRESSION Right 10/30/2014   Procedure: Right shoulder arthroscopy rotator cuff repair, subacromial decompression;  Surgeon: Tania Ade, MD;  Location: [place];  Service: Orthopedics;  Laterality: Right;  Right shoulder arthrscopy rotator cuff repair, subacromial decompression    Current Medications: Current Meds  Medication Sig  . atorvastatin (LIPITOR) 80 MG tablet TAKE 1 TABLET BY MOUTH  DAILY  . fluticasone (FLONASE) 50 MCG/ACT nasal spray Place 2 sprays into both nostrils daily.  . nitroGLYCERIN (NITROSTAT) 0.4 MG SL tablet Place 0.4 mg under the tongue every 5 (five) minutes x 3 doses as needed for chest pain.  Marland Kitchen XARELTO 20 MG TABS tablet TAKE 1 TABLET BY MOUTH  DAILY WITH SUPPER  . [DISCONTINUED] metoprolol tartrate (LOPRESSOR) 25 MG tablet TAKE ONE-HALF TABLET BY  MOUTH TWICE DAILY   Current Facility-Administered Medications for  the 06/16/19 encounter (Office Visit) with Jerline Pain, MD  Medication  . sodium chloride flush (NS) 0.9 % injection 3 mL     Allergies:   Feldene [piroxicam]   Social History   Socioeconomic History  . Marital status: Married    Spouse name: Not on file  . Number of children: Not on file  . Years of education: Not on file  . Highest education level: Not on file  Occupational History  . Not on file    Tobacco Use  . Smoking status: Former Smoker    Years: 27.00    Types: Cigarettes    Quit date: 03/25/1999    Years since quitting: 20.2  . Smokeless tobacco: Never Used  Substance and Sexual Activity  . Alcohol use: No  . Drug use: No  . Sexual activity: Not Currently  Other Topics Concern  . Not on file  Social History Narrative  . Not on file   Social Determinants of Health   Financial Resource Strain:   . Difficulty of Paying Living Expenses:   Food Insecurity:   . Worried About Charity fundraiser in the Last Year:   . Arboriculturist in the Last Year:   Transportation Needs:   . Film/video editor (Medical):   Marland Kitchen Lack of Transportation (Non-Medical):   Physical Activity:   . Days of Exercise per Week:   . Minutes of Exercise per Session:   Stress:   . Feeling of Stress :   Social Connections:   . Frequency of Communication with Friends and Family:   . Frequency of Social Gatherings with Friends and Family:   . Attends Religious Services:   . Active Member of Clubs or Organizations:   . Attends Archivist Meetings:   Marland Kitchen Marital Status:      Family History: The patient's family history includes Alcohol abuse in his father and mother; Arrhythmia in his mother; Colon cancer in his father; Stroke in his maternal grandfather.  ROS:   Please see the history of present illness.    No fevers, no syncope, no orthopnea.  All other systems reviewed and are negative.  EKGs/Labs/Other Studies Reviewed:     Cath 08/13/17: The following studies were reviewed today:   Prox RCA lesion is 20% stenosed.  Mid RCA lesion is 30% stenosed.  Prox Cx lesion is 80% stenosed.  A drug-eluting stent was successfully placed using a STENT RESOLUTE ONYX 4.0X18.  Post intervention, there is a 0% residual stenosis.  The left ventricular systolic function is normal.  LV end diastolic pressure is normal.  The left ventricular ejection fraction is greater than 65% by  visual estimate.  There is no mitral valve regurgitation.   1. Severe stenosis proximal Circumflex artery 2. Successful PTCA/DES x 1 proximal Circumflex but unfortunately a very small caliber obtuse marginal branch was jailed by the stent and flow was lost down this branch. The branch was 0.5-0.75 mm and too small for a balloon or stent. He had severe chest pain post stent deployment felt to be due to the loss of flow down this small branch.  3. Mild non-obstructive plaque in the RCA 4. Normal LV systolic function  Recommendations: Continue triple therapy with ASA/Plavix/Xarelto for one month.  Stop ASA in one month. In one year, consider stopping Plavix and restarting ASA along with Xarelto. Start high intensity statin. Continue beta blocker.   EKG:  EKG is  ordered today.  The ekg ordered  today demonstrates sinus rhythm with nonspecific ST-T wave changes  Recent Labs: 11/09/2018: BUN 12; Creatinine, Ser 0.96; Hemoglobin 14.9; Platelets 275; Potassium 4.9; Sodium 142  Recent Lipid Panel    Component Value Date/Time   CHOL 98 11/27/2017 1127   TRIG 39 11/27/2017 1127   HDL 43 11/27/2017 1127   CHOLHDL 2.3 11/27/2017 1127   VLDL 8 11/02/2015 0804   LDLCALC 44 11/27/2017 1127    Physical Exam:    VS:  BP 112/64   Pulse 64   Resp 15   Ht 5\' 10"  (1.778 m)   Wt 214 lb 6.4 oz (97.3 kg)   SpO2 97%   BMI 30.76 kg/m     Wt Readings from Last 3 Encounters:  06/16/19 214 lb 6.4 oz (97.3 kg)  01/19/19 208 lb (94.3 kg)  11/09/18 203 lb 12.8 oz (92.4 kg)     GEN: Tired appearing well nourished, well developed in no acute distress HEENT: Normal NECK: No JVD; No carotid bruits LYMPHATICS: No lymphadenopathy CARDIAC: RRR, no murmurs, rubs, gallops RESPIRATORY:  Clear to auscultation without rales, wheezing or rhonchi  ABDOMEN: Soft, non-tender, non-distended MUSCULOSKELETAL:  No edema; No deformity  SKIN: Warm and dry NEUROLOGIC:  Alert and oriented x 3 PSYCHIATRIC:  Normal affect    ASSESSMENT:    1. Coronary artery disease involving native coronary artery of native heart without angina pectoris   2. Paroxysmal atrial fibrillation (HCC)   3. S/P ablation of atrial fibrillation   4. Angina pectoris ([place])   5. Pre-procedure lab exam   6. Fatigue, unspecified type   7. Medication management    PLAN:    In order of problems listed above:  Coronary artery disease status post circumflex stent now with anginal symptoms extreme fatigue weakness blurry vision at times hypotension -We will go ahead and check lab work, CBC complete metabolic profile, lipid panel, TSH, free T4. -Given his exertional anginal component, we will get him set up for cardiac catheterization.  Risk and benefits stroke heart attack death renal impairment bleeding.  Willing to proceed.  Paroxysmal atrial fibrillation -Post ablation.  Was doing quite well.  EKG today shows sinus rhythm 60 with no other abnormalities.  Chronic anticoagulation -On Xarelto.  Could his weakness be secondary to anemia.  Last hemoglobin in August 2020 was 14.9.  He did state that he had some "messed up "stools in the trailer in [place] when he was visiting.  Weakness/hypotension -I think it makes sense for [place] to discontinue his metoprolol 25 mg twice a day.  Blood pressure fairly soft.  Once again checking for anemia.  I explained to him there could be several other reasons for his current constellation of symptoms.  1 week follow-up post cath   Medication Adjustments/Labs and Tests Ordered: Current medicines are reviewed at length with the patient today.  Concerns regarding medicines are outlined above.  Orders Placed This Encounter  Procedures  . Comprehensive metabolic panel  . CBC  . Lipid panel  . TSH  . T4, free   Meds ordered this encounter  Medications  . sodium chloride flush (NS) 0.9 % injection 3 mL    Patient Instructions  Medication Instructions:  Please discontinue your Metoprolol.  Continue  all other medications as listed.  *If you need a refill on your cardiac medications before your next appointment, please call your pharmacy*   Lab Work: Please have blood work today (CBC, CMP, Lipid, TSH, Free T4)  If you have labs (blood  work) drawn today and your tests are completely normal, you will receive your results only by: Marland Kitchen MyChart Message (if you have MyChart) OR . A paper copy in the mail If you have any lab test that is abnormal or we need to change your treatment, we will call you to review the results.   Testing/Procedures: Your physician has requested that you have a cardiac catheterization. Cardiac catheterization is used to diagnose and/or treat various heart conditions. Doctors may recommend this procedure for a number of different reasons. The most common reason is to evaluate chest pain. Chest pain can be a symptom of coronary artery disease (CAD), and cardiac catheterization can show whether plaque is narrowing or blocking your heart's arteries. This procedure is also used to evaluate the valves, as well as measure the blood flow and oxygen levels in different parts of your heart. For further information please visit [email or web address]. Please follow instruction sheet, as given.  Follow-Up: At [facility], you and your health needs are our priority.  As part of our continuing mission to provide you with exceptional heart care, we have created designated Provider Care Teams.  These Care Teams include your primary Cardiologist (physician) and Advanced Practice Providers (APPs -  Physician Assistants and Nurse Practitioners) who all work together to provide you with the care you need, when you need it.  We recommend signing up for the patient portal called "MyChart".  Sign up information is provided on this After Visit Summary.  MyChart is used to connect with patients for Virtual Visits (Telemedicine).  Patients are able to view lab/test results, encounter notes, upcoming  appointments, etc.  Non-urgent messages can be sent to your provider as well.   To learn more about what you can do with MyChart, go to [email or web address].    Your next appointment:   4 week(s)  The format for your next appointment:   In Person  Provider:   Candee Furbish, MD   Thank you for choosing [facility]!!       [place] OFFICE [place], [place] [address] Dept: [phone] Loc: [phone]  Stephen King  06/16/2019  You are scheduled for a  Cardiac Cath on  Monday, March 29,2021  with Dr. Irish Lack.  1. Please arrive at the [facility] (Main Entrance A) at [facility]: [address] [place], [place] [address] at  5:30 AM  (two hours before your procedure to ensure your preparation). Free valet parking service is available.   Special note: Every effort is made to have your procedure done on time. Please understand that emergencies sometimes delay scheduled procedures.  2. Diet: Nothing after midnight the night before.  3. Labs: please have blood work today.  Have COVID screening at he [facility] location as scheduled.  4. Medication instructions in preparation for your procedure:  Hold Xarelto the day before and morning of.  On the morning of your procedure, you may take your other medications as listed.  You may use sips of water.  5. Plan for one night stay--bring personal belongings. 6. Bring a current list of your medications and current insurance cards. 7. You MUST have a responsible person to drive you home. 8. Someone MUST be with you the first 24 hours after you arrive home or your discharge will be delayed. 9. Please wear clothes that are easy to get on and off and wear slip-on shoes.  Thank you for allowing [place] to care for you!   -- [facility] Invasive Cardiovascular services     Signed, Candee Furbish, MD  06/16/2019 1:19  PM    [place]

## 2019-06-20 ENCOUNTER — Ambulatory Visit (HOSPITAL_COMMUNITY)
Admission: RE | Admit: 2019-06-20 | Discharge: 2019-06-20 | Disposition: A | Discharge status: Home / Self Care | Payer: Medicare Other | Attending: Internal Medicine | Admitting: Internal Medicine

## 2019-06-20 ENCOUNTER — Other Ambulatory Visit: Payer: Self-pay

## 2019-06-20 ENCOUNTER — Encounter (HOSPITAL_COMMUNITY)
Admission: RE | Disposition: A | Discharge status: Home / Self Care | Payer: Medicare Other | Source: Home / Self Care | Attending: Internal Medicine

## 2019-06-20 DIAGNOSIS — Z87891 Personal history of nicotine dependence: Secondary | ICD-10-CM | POA: Insufficient documentation

## 2019-06-20 DIAGNOSIS — R5383 Other fatigue: Secondary | ICD-10-CM | POA: Diagnosis not present

## 2019-06-20 DIAGNOSIS — I480 Paroxysmal atrial fibrillation: Secondary | ICD-10-CM | POA: Insufficient documentation

## 2019-06-20 DIAGNOSIS — I25110 Atherosclerotic heart disease of native coronary artery with unstable angina pectoris: Secondary | ICD-10-CM | POA: Diagnosis not present

## 2019-06-20 DIAGNOSIS — Z8673 Personal history of transient ischemic attack (TIA), and cerebral infarction without residual deficits: Secondary | ICD-10-CM | POA: Insufficient documentation

## 2019-06-20 DIAGNOSIS — Z79899 Other long term (current) drug therapy: Secondary | ICD-10-CM | POA: Diagnosis not present

## 2019-06-20 DIAGNOSIS — I2510 Atherosclerotic heart disease of native coronary artery without angina pectoris: Secondary | ICD-10-CM

## 2019-06-20 DIAGNOSIS — Z955 Presence of coronary angioplasty implant and graft: Secondary | ICD-10-CM | POA: Diagnosis not present

## 2019-06-20 DIAGNOSIS — I200 Unstable angina: Secondary | ICD-10-CM | POA: Diagnosis present

## 2019-06-20 DIAGNOSIS — Z7901 Long term (current) use of anticoagulants: Secondary | ICD-10-CM | POA: Diagnosis not present

## 2019-06-20 HISTORY — PX: LEFT HEART CATH AND CORONARY ANGIOGRAPHY: CATH118249

## 2019-06-20 SURGERY — LEFT HEART CATH AND CORONARY ANGIOGRAPHY
Anesthesia: LOCAL

## 2019-06-20 MED ORDER — HYDRALAZINE HCL 20 MG/ML IJ SOLN: 10.0000 mg | INTRAMUSCULAR | Status: DC | PRN

## 2019-06-20 MED ORDER — OMEPRAZOLE 20 MG PO CPDR: 20.0000 mg | DELAYED_RELEASE_CAPSULE | Freq: Every day | ORAL | Status: DC

## 2019-06-20 MED ORDER — LABETALOL HCL 5 MG/ML IV SOLN: 10.0000 mg | INTRAVENOUS | Status: DC | PRN

## 2019-06-20 MED ORDER — LIDOCAINE HCL (PF) 1 % IJ SOLN
INTRAMUSCULAR | Status: DC | PRN
  Administered 2019-06-20: 2 mL

## 2019-06-20 MED ORDER — SODIUM CHLORIDE 0.9% FLUSH: 3.0000 mL | INTRAVENOUS | Status: DC | PRN

## 2019-06-20 MED ORDER — SODIUM CHLORIDE 0.9 % IV SOLN: 250.0000 mL | INTRAVENOUS | Status: DC | PRN

## 2019-06-20 MED ORDER — SODIUM CHLORIDE 0.9 % WEIGHT BASED INFUSION
3.0000 mL/kg/h | INTRAVENOUS | Status: AC
  Administered 2019-06-20: 06:00:00 3 mL/kg/h via INTRAVENOUS

## 2019-06-20 MED ORDER — ONDANSETRON HCL 4 MG/2ML IJ SOLN: 4.0000 mg | Freq: Four times a day (QID) | INTRAMUSCULAR | Status: DC | PRN

## 2019-06-20 MED ORDER — HEPARIN SODIUM (PORCINE) 1000 UNIT/ML IJ SOLN
INTRAMUSCULAR | Status: DC | PRN
  Administered 2019-06-20: 5000 [IU] via INTRAVENOUS

## 2019-06-20 MED ORDER — VERAPAMIL HCL 2.5 MG/ML IV SOLN
INTRAVENOUS | Status: AC
  Filled 2019-06-20: qty 2

## 2019-06-20 MED ORDER — IOHEXOL 350 MG/ML SOLN
INTRAVENOUS | Status: DC | PRN
  Administered 2019-06-20: 65 mL

## 2019-06-20 MED ORDER — SODIUM CHLORIDE 0.9 % WEIGHT BASED INFUSION: 1.0000 mL/kg/h | INTRAVENOUS | Status: DC

## 2019-06-20 MED ORDER — MIDAZOLAM HCL 2 MG/2ML IJ SOLN
INTRAMUSCULAR | Status: DC | PRN
  Administered 2019-06-20: 1 mg via INTRAVENOUS

## 2019-06-20 MED ORDER — MIDAZOLAM HCL 2 MG/2ML IJ SOLN
INTRAMUSCULAR | Status: AC
  Filled 2019-06-20: qty 2

## 2019-06-20 MED ORDER — ASPIRIN 81 MG PO CHEW
81.0000 mg | CHEWABLE_TABLET | ORAL | Status: AC
  Administered 2019-06-20: 81 mg via ORAL
  Filled 2019-06-20: qty 1

## 2019-06-20 MED ORDER — HEPARIN (PORCINE) IN NACL 1000-0.9 UT/500ML-% IV SOLN
INTRAVENOUS | Status: AC
  Filled 2019-06-20: qty 1000

## 2019-06-20 MED ORDER — FENTANYL CITRATE (PF) 100 MCG/2ML IJ SOLN
INTRAMUSCULAR | Status: DC | PRN
  Administered 2019-06-20: 25 ug via INTRAVENOUS

## 2019-06-20 MED ORDER — HEPARIN (PORCINE) IN NACL 1000-0.9 UT/500ML-% IV SOLN
INTRAVENOUS | Status: DC | PRN
  Administered 2019-06-20: 500 mL

## 2019-06-20 MED ORDER — SODIUM CHLORIDE 0.9% FLUSH: 3.0000 mL | Freq: Two times a day (BID) | INTRAVENOUS | Status: DC

## 2019-06-20 MED ORDER — ACETAMINOPHEN 325 MG PO TABS: 650.0000 mg | ORAL_TABLET | ORAL | Status: DC | PRN

## 2019-06-20 MED ORDER — SODIUM CHLORIDE 0.9 % IV SOLN: INTRAVENOUS | Status: DC

## 2019-06-20 MED ORDER — FENTANYL CITRATE (PF) 100 MCG/2ML IJ SOLN
INTRAMUSCULAR | Status: AC
  Filled 2019-06-20: qty 2

## 2019-06-20 MED ORDER — LIDOCAINE HCL (PF) 1 % IJ SOLN
INTRAMUSCULAR | Status: AC
  Filled 2019-06-20: qty 30

## 2019-06-20 MED ORDER — VERAPAMIL HCL 2.5 MG/ML IV SOLN
INTRAVENOUS | Status: DC | PRN
  Administered 2019-06-20: 10 mL via INTRA_ARTERIAL

## 2019-06-20 MED ORDER — HEPARIN SODIUM (PORCINE) 1000 UNIT/ML IJ SOLN
INTRAMUSCULAR | Status: AC
  Filled 2019-06-20: qty 1

## 2019-06-20 SURGICAL SUPPLY — 9 items

## 2019-06-20 NOTE — Discharge Instructions (Signed)
Radial Site Care  This sheet gives you information about how to care for yourself after your procedure. Your health care provider may also give you more specific instructions. If you have problems or questions, contact your health care provider. What can I expect after the procedure? After the procedure, it is common to have:  Bruising and tenderness at the catheter insertion area. Follow these instructions at home: Medicines  Take over-the-counter and prescription medicines only as told by your health care provider. Insertion site care  Follow instructions from your health care provider about how to take care of your insertion site. Make sure you: ? Wash your hands with soap and water before you change your bandage (dressing). If soap and water are not available, use hand sanitizer. ? Change your dressing as told by your health care provider. ? Leave stitches (sutures), skin glue, or adhesive strips in place. These skin closures may need to stay in place for 2 weeks or longer. If adhesive strip edges start to loosen and curl up, you may trim the loose edges. Do not remove adhesive strips completely unless your health care provider tells you to do that.  Check your insertion site every day for signs of infection. Check for: ? Redness, swelling, or pain. ? Fluid or blood. ? Pus or a bad smell. ? Warmth.  Do not take baths, swim, or use a hot tub until your health care provider approves.  You may shower 24-48 hours after the procedure, or as directed by your health care provider. ? Remove the dressing and gently wash the site with plain soap and water. ? Pat the area dry with a clean towel. ? Do not rub the site. That could cause bleeding.  Do not apply powder or lotion to the site. Activity   For 24 hours after the procedure, or as directed by your health care provider: ? Do not flex or bend the affected arm. ? Do not push or pull heavy objects with the affected arm. ? Do not  drive yourself home from the hospital or clinic. You may drive 24 hours after the procedure unless your health care provider tells you not to. ? Do not operate machinery or power tools.  Do not lift anything that is heavier than 10 lb (4.5 kg), or the limit that you are told, until your health care provider says that it is safe.  Ask your health care provider when it is okay to: ? Return to work or school. ? Resume usual physical activities or sports. ? Resume sexual activity. General instructions  If the catheter site starts to bleed, raise your arm and put firm pressure on the site. If the bleeding does not stop, get help right away. This is a medical emergency.  If you went home on the same day as your procedure, a responsible adult should be with you for the first 24 hours after you arrive home.  Keep all follow-up visits as told by your health care provider. This is important. Contact a health care provider if:  You have a fever.  You have redness, swelling, or yellow drainage around your insertion site. Get help right away if:  You have unusual pain at the radial site.  The catheter insertion area swells very fast.  The insertion area is bleeding, and the bleeding does not stop when you hold steady pressure on the area.  Your arm or hand becomes pale, cool, tingly, or numb. These symptoms may represent a serious problem   that is an emergency. Do not wait to see if the symptoms will go away. Get medical help right away. Call your local emergency services (911 in the [place] Do not drive yourself to the hospital. Summary  After the procedure, it is common to have bruising and tenderness at the site.  Follow instructions from your health care provider about how to take care of your radial site wound. Check the wound every day for signs of infection.  Do not lift anything that is heavier than 10 lb (4.5 kg), or the limit that you are told, until your health care provider says  that it is safe. This information is not intended to replace advice given to you by your health care provider. Make sure you discuss any questions you have with your health care provider. Document Revised: 04/15/2017 Document Reviewed: 04/15/2017 Elsevier Patient Education  2020 Elsevier Inc.  

## 2019-06-20 NOTE — Progress Notes (Signed)
Discharge instructions reviewed with pt and his wife (via telephone) both voice understanding.  

## 2019-06-20 NOTE — Interval H&P Note (Signed)
History and Physical Interval Note:  06/20/2019 7:10 AM  Stephen King  has presented today for surgery, with the diagnosis of chest pain unstable angina.  The various methods of treatment have been discussed with the patient and family. After consideration of risks, benefits and other options for treatment, the patient has consented to  Procedure(s): LEFT HEART CATH AND CORONARY ANGIOGRAPHY (N/A) as a surgical intervention.  The patient's history has been reviewed, patient examined, no change in status, stable for surgery.  I have reviewed the patient's chart and labs.  Questions were answered to the patient's satisfaction.    Cath Lab Visit (complete for each Cath Lab visit)  Clinical Evaluation Leading to the Procedure:   ACS: No.  Non-ACS:    Anginal Classification: CCS IV  Anti-ischemic medical therapy: No Therapy  Non-Invasive Test Results: No non-invasive testing performed  Prior CABG: No previous CABG  [place] [place]

## 2019-06-21 ENCOUNTER — Encounter: Payer: Self-pay | Admitting: Cardiology

## 2019-06-21 ENCOUNTER — Other Ambulatory Visit: Payer: Self-pay

## 2019-06-21 DIAGNOSIS — I4892 Unspecified atrial flutter: Secondary | ICD-10-CM

## 2019-06-22 NOTE — Addendum Note (Signed)
Addended by: Nuala Alpha on: 06/22/2019 08:55 AM   Modules accepted: Orders

## 2019-06-28 ENCOUNTER — Encounter: Payer: Self-pay | Admitting: Physician Assistant

## 2019-06-30 ENCOUNTER — Other Ambulatory Visit: Payer: Self-pay | Admitting: Nurse Practitioner

## 2019-06-30 ENCOUNTER — Ambulatory Visit (INDEPENDENT_AMBULATORY_CARE_PROVIDER_SITE_OTHER): Payer: Medicare Other | Admitting: Nurse Practitioner

## 2019-06-30 ENCOUNTER — Encounter: Payer: Self-pay | Admitting: Nurse Practitioner

## 2019-06-30 ENCOUNTER — Other Ambulatory Visit: Payer: Self-pay

## 2019-06-30 VITALS — BP 102/64 | HR 78 | Ht 70.0 in | Wt 213.0 lb

## 2019-06-30 DIAGNOSIS — S61431A Puncture wound without foreign body of right hand, initial encounter: Secondary | ICD-10-CM

## 2019-06-30 DIAGNOSIS — Z23 Encounter for immunization: Secondary | ICD-10-CM | POA: Diagnosis not present

## 2019-06-30 DIAGNOSIS — K219 Gastro-esophageal reflux disease without esophagitis: Secondary | ICD-10-CM

## 2019-06-30 MED ORDER — PANTOPRAZOLE SODIUM 40 MG PO TBEC: 40.0000 mg | DELAYED_RELEASE_TABLET | Freq: Two times a day (BID) | ORAL | 0 refills | Status: DC

## 2019-06-30 NOTE — Patient Instructions (Signed)
In about 3 weeks send me a message and let me know how you are feeling. If it is working for you then we will keep it going at a lower dose. If it isn't working then we can go ahead and send in a referral for GI.    Gastroesophageal Reflux Disease, Adult Gastroesophageal reflux (GER) happens when acid from the stomach flows up into the tube that connects the mouth and the stomach (esophagus). Normally, food travels down the esophagus and stays in the stomach to be digested. With GER, food and stomach acid sometimes move back up into the esophagus. You may have a disease called gastroesophageal reflux disease (GERD) if the reflux:  Happens often.  Causes frequent or very bad symptoms.  Causes problems such as damage to the esophagus. When this happens, the esophagus becomes sore and swollen (inflamed). Over time, GERD can make small holes (ulcers) in the lining of the esophagus. What are the causes? This condition is caused by a problem with the muscle between the esophagus and the stomach. When this muscle is weak or not normal, it does not close properly to keep food and acid from coming back up from the stomach. The muscle can be weak because of:  Tobacco use.  Pregnancy.  Having a certain type of hernia (hiatal hernia).  Alcohol use.  Certain foods and drinks, such as coffee, chocolate, onions, and peppermint. What increases the risk? You are more likely to develop this condition if you:  Are overweight.  Have a disease that affects your connective tissue.  Use NSAID medicines. What are the signs or symptoms? Symptoms of this condition include:  Heartburn.  Difficult or painful swallowing.  The feeling of having a lump in the throat.  A bitter taste in the mouth.  Bad breath.  Having a lot of saliva.  Having an upset or bloated stomach.  Belching.  Chest pain. Different conditions can cause chest pain. Make sure you see your doctor if you have chest  pain.  Shortness of breath or noisy breathing (wheezing).  Ongoing (chronic) cough or a cough at night.  Wearing away of the surface of teeth (tooth enamel).  Weight loss. How is this treated? Treatment will depend on how bad your symptoms are. Your doctor may suggest:  Changes to your diet.  Medicine.  Surgery. Follow these instructions at home: Eating and drinking   Follow a diet as told by your doctor. You may need to avoid foods and drinks such as: ? Coffee and tea (with or without caffeine). ? Drinks that contain alcohol. ? Energy drinks and sports drinks. ? Bubbly (carbonated) drinks or sodas. ? Chocolate and cocoa. ? Peppermint and mint flavorings. ? Garlic and onions. ? Horseradish. ? Spicy and acidic foods. These include peppers, chili powder, curry powder, vinegar, hot sauces, and BBQ sauce. ? Citrus fruit juices and citrus fruits, such as oranges, lemons, and limes. ? Tomato-based foods. These include red sauce, chili, salsa, and pizza with red sauce. ? Fried and fatty foods. These include donuts, french fries, potato chips, and high-fat dressings. ? High-fat meats. These include hot dogs, rib eye steak, sausage, ham, and bacon. ? High-fat dairy items, such as whole milk, butter, and cream cheese.  Eat small meals often. Avoid eating large meals.  Avoid drinking large amounts of liquid with your meals.  Avoid eating meals during the 2-3 hours before bedtime.  Avoid lying down right after you eat.  Do not exercise right after you eat.  Lifestyle   Do not use any products that contain nicotine or tobacco. These include cigarettes, e-cigarettes, and chewing tobacco. If you need help quitting, ask your doctor.  Try to lower your stress. If you need help doing this, ask your doctor.  If you are overweight, lose an amount of weight that is healthy for you. Ask your doctor about a safe weight loss goal. General instructions  Pay attention to any changes in  your symptoms.  Take over-the-counter and prescription medicines only as told by your doctor. Do not take aspirin, ibuprofen, or other NSAIDs unless your doctor says it is okay.  Wear loose clothes. Do not wear anything tight around your waist.  Raise (elevate) the head of your bed about 6 inches (15 cm).  Avoid bending over if this makes your symptoms worse.  Keep all follow-up visits as told by your doctor. This is important. Contact a doctor if:  You have new symptoms.  You lose weight and you do not know why.  You have trouble swallowing or it hurts to swallow.  You have wheezing or a cough that keeps happening.  Your symptoms do not get better with treatment.  You have a hoarse voice. Get help right away if:  You have pain in your arms, neck, jaw, teeth, or back.  You feel sweaty, dizzy, or light-headed.  You have chest pain or shortness of breath.  You throw up (vomit) and your throw-up looks like blood or coffee grounds.  You pass out (faint).  Your poop (stool) is bloody or black.  You cannot swallow, drink, or eat. Summary  If a person has gastroesophageal reflux disease (GERD), food and stomach acid move back up into the esophagus and cause symptoms or problems such as damage to the esophagus.  Treatment will depend on how bad your symptoms are.  Follow a diet as told by your doctor.  Take all medicines only as told by your doctor. This information is not intended to replace advice given to you by your health care provider. Make sure you discuss any questions you have with your health care provider. Document Revised: 09/16/2017 Document Reviewed: 09/16/2017 Elsevier Patient Education  [place].

## 2019-06-30 NOTE — Progress Notes (Signed)
Acute Office Visit  Subjective:    Patient ID: Stephen King, male    DOB: 06/08/1950, 69 y.o.   MRN: [id number]  Chief Complaint  Patient presents with  . Gastroesophageal Reflux    HPI Patient is in today for abdominal pain in the epigastric region that has been going on for several weeks now.  At first his pain was more substernal burning sensation.  He went to cardiology and a heart catheterization was performed which was clear.  Cardiologist suggested he might have GERD.  He reports the pain is now more epigastric with the same deep burning sensation.  He reports increased belching and flatulence.  He has elevated the head of his bed the last few nights and reports that it helps significantly.  He did start taking Prilosec about a week ago he is unsure if this has been helping his symptoms.  He tried milk of magnesia yesterday which only resulted in diarrhea.    INJURY TO HAND Patient has a puncture wound to his interdigital webbing to left hand between thumb and 1st finger from a injury with a screwdriver.  Patient denies pain, redness, swelling, bleeding.  He is due for tetanus vaccine.  Past Medical History:  Diagnosis Date  . Arthritis    "?right knee" (08/13/2017)  . Dysrhythmia    PAF  . Hernia, umbilical   . History of hiatal hernia   . History of kidney stones   . Stroke [facility]) 12/2009   "has to watch where he's walking since; more of a spacial thing resulting from stroke" (08/13/2017)    Past Surgical History:  Procedure Laterality Date  . ABDOMINAL HERNIA REPAIR     "has a mesh in his abdomen"  . ANTERIOR CERVICAL DECOMP/DISCECTOMY FUSION  10/2005   C6-7  . ATRIAL FIBRILLATION ABLATION N/A 09/17/2017   Procedure: ATRIAL FIBRILLATION ABLATION;  Surgeon: Constance Haw, MD;  Location: [place] [facility];  Service: Cardiovascular;  Laterality: N/A;  . BACK SURGERY    . CARDIAC CATHETERIZATION  2003  . CATARACT EXTRACTION W/ INTRAOCULAR LENS  IMPLANT, BILATERAL  Bilateral   . CORONARY ANGIOPLASTY WITH STENT PLACEMENT  08/13/2017  . CORONARY STENT INTERVENTION N/A 08/13/2017   Procedure: CORONARY STENT INTERVENTION;  Surgeon: Burnell Blanks, MD;  Location: [place] [facility];  Service: Cardiovascular;  Laterality: N/A;  . EXCISIONAL HEMORRHOIDECTOMY    . HERNIA REPAIR    . LEFT HEART CATH AND CORONARY ANGIOGRAPHY N/A 08/13/2017   Procedure: LEFT HEART CATH AND CORONARY ANGIOGRAPHY;  Surgeon: Burnell Blanks, MD;  Location: [place] [facility];  Service: Cardiovascular;  Laterality: N/A;  . LEFT HEART CATH AND CORONARY ANGIOGRAPHY N/A 06/20/2019   Procedure: LEFT HEART CATH AND CORONARY ANGIOGRAPHY;  Surgeon: Nelva Bush, MD;  Location: [place] [facility];  Service: Cardiovascular;  Laterality: N/A;  . SHOULDER ARTHROSCOPY WITH ROTATOR CUFF REPAIR Right 10/30/2014   Procedure: SHOULDER ARTHROSCOPY WITH ROTATOR CUFF REPAIR;  Surgeon: Tania Ade, MD;  Location: [place];  Service: Orthopedics;  Laterality: Right;  . SHOULDER ARTHROSCOPY WITH SUBACROMIAL DECOMPRESSION Right 10/30/2014   Procedure: Right shoulder arthroscopy rotator cuff repair, subacromial decompression;  Surgeon: Tania Ade, MD;  Location: [place];  Service: Orthopedics;  Laterality: Right;  Right shoulder arthrscopy rotator cuff repair, subacromial decompression    Family History  Problem Relation Age of Onset  . Alcohol abuse Mother   . Arrhythmia Mother   . Colon cancer Father   .  Alcohol abuse Father   . Stroke Maternal Grandfather     Social History   Socioeconomic History  . Marital status: Married    Spouse name: Not on file  . Number of children: Not on file  . Years of education: Not on file  . Highest education level: Not on file  Occupational History  . Not on file  Tobacco Use  . Smoking status: Former Smoker    Years: 27.00    Types: Cigarettes    Quit date: 03/25/1999    Years since quitting: 20.2  .  Smokeless tobacco: Never Used  Substance and Sexual Activity  . Alcohol use: No  . Drug use: No  . Sexual activity: Not Currently  Other Topics Concern  . Not on file  Social History Narrative  . Not on file   Social Determinants of Health   Financial Resource Strain:   . Difficulty of Paying Living Expenses:   Food Insecurity:   . Worried About Charity fundraiser in the Last Year:   . Arboriculturist in the Last Year:   Transportation Needs:   . Film/video editor (Medical):   Marland Kitchen Lack of Transportation (Non-Medical):   Physical Activity:   . Days of Exercise per Week:   . Minutes of Exercise per Session:   Stress:   . Feeling of Stress :   Social Connections:   . Frequency of Communication with Friends and Family:   . Frequency of Social Gatherings with Friends and Family:   . Attends Religious Services:   . Active Member of Clubs or Organizations:   . Attends Archivist Meetings:   Marland Kitchen Marital Status:   Intimate Partner Violence:   . Fear of Current or Ex-Partner:   . Emotionally Abused:   Marland Kitchen Physically Abused:   . Sexually Abused:     Outpatient Medications Prior to Visit  Medication Sig Dispense Refill  . atorvastatin (LIPITOR) 80 MG tablet TAKE 1 TABLET BY MOUTH  DAILY (Patient taking differently: Take 80 mg by mouth every evening. ) 90 tablet 1  . fluticasone (FLONASE) 50 MCG/ACT nasal spray Place 2 sprays into both nostrils daily. (Patient not taking: Reported on 06/17/2019) 16 g 6  . nitroGLYCERIN (NITROSTAT) 0.4 MG SL tablet Place 0.4 mg under the tongue every 5 (five) minutes x 3 doses as needed for chest pain.    Marland Kitchen omeprazole (PRILOSEC) 20 MG capsule Take 1 capsule (20 mg total) by mouth daily.    Alveda Reasons 20 MG TABS tablet TAKE 1 TABLET BY MOUTH  DAILY WITH SUPPER (Patient taking differently: Take 20 mg by mouth daily with supper. High risk med: Anticoagulant.  Crushed Xarelto can be given down a G-tube but NOT a J-Tube.) 90 tablet 2    Facility-Administered Medications Prior to Visit  Medication Dose Route Frequency Provider Last Rate Last Admin  . sodium chloride flush (NS) 0.9 % injection 3 mL  3 mL Intravenous Q12H Jerline Pain, MD        Allergies  Allergen Reactions  . Feldene [Piroxicam] Hives    Review of Systems  Constitutional: Negative for appetite change, chills, diaphoresis, fatigue and fever.  HENT: Negative for trouble swallowing.   Respiratory: Positive for cough. Negative for chest tightness and shortness of breath.   Cardiovascular: Positive for chest pain. Negative for palpitations and leg swelling.  Gastrointestinal: Positive for abdominal distention, abdominal pain and diarrhea. Negative for blood in stool, nausea, rectal pain and  vomiting.       Objective:    Physical Exam Vitals and nursing note reviewed.  Constitutional:      Appearance: Normal appearance.  HENT:     Head: Normocephalic.     Mouth/Throat:     Mouth: Mucous membranes are moist.     Pharynx: Oropharynx is clear.  Eyes:     Extraocular Movements: Extraocular movements intact.     Conjunctiva/sclera: Conjunctivae normal.     Pupils: Pupils are equal, round, and reactive to light.  Cardiovascular:     Rate and Rhythm: Normal rate.     Heart sounds: Normal heart sounds.  Pulmonary:     Effort: Pulmonary effort is normal.     Breath sounds: Normal breath sounds.  Abdominal:     General: Abdomen is flat. There is no distension.     Palpations: Abdomen is soft. There is no hepatomegaly or splenomegaly.     Tenderness: There is abdominal tenderness in the epigastric area. There is no guarding or rebound.     Hernia: No hernia is present.     Comments: Significant epigastric tenderness to palpation.  Musculoskeletal:        General: Normal range of motion.     Cervical back: Normal range of motion.     Right lower leg: No edema.     Left lower leg: No edema.  Skin:    General: Skin is warm and dry.      Capillary Refill: Capillary refill takes less than 2 seconds.       Neurological:     General: No focal deficit present.     Mental Status: He is alert and oriented to person, place, and time.  Psychiatric:        Mood and Affect: Mood normal.        Behavior: Behavior normal.        Thought Content: Thought content normal.        Judgment: Judgment normal.     BP 102/64   Pulse 78   Ht 5\' 10"  (1.778 m)   Wt 213 lb (96.6 kg)   BMI 30.56 kg/m  Wt Readings from Last 3 Encounters:  06/30/19 213 lb (96.6 kg)  06/20/19 210 lb (95.3 kg)  06/16/19 214 lb 6.4 oz (97.3 kg)    There are no preventive care reminders to display for this patient.  There are no preventive care reminders to display for this patient.   Lab Results  Component Value Date   TSH 1.020 06/16/2019   Lab Results  Component Value Date   WBC 7.6 06/16/2019   HGB 15.3 06/16/2019   HCT 43.0 06/16/2019   MCV 93 06/16/2019   PLT 257 06/16/2019   Lab Results  Component Value Date   NA 141 06/16/2019   K 4.1 06/16/2019   CO2 26 06/16/2019   GLUCOSE 117 (H) 06/16/2019   BUN 10 06/16/2019   CREATININE 0.85 06/16/2019   BILITOT 0.8 06/16/2019   ALKPHOS 94 06/16/2019   AST 16 06/16/2019   ALT 19 06/16/2019   PROT 5.9 (L) 06/16/2019   ALBUMIN 4.0 06/16/2019   CALCIUM 9.0 06/16/2019   ANIONGAP 10 08/16/2017   GFR 86.22 04/04/2014   Lab Results  Component Value Date   CHOL 97 (L) 06/16/2019   Lab Results  Component Value Date   HDL 45 06/16/2019   Lab Results  Component Value Date   LDLCALC 36 06/16/2019   Lab Results  Component Value  Date   TRIG 79 06/16/2019   Lab Results  Component Value Date   CHOLHDL 2.2 06/16/2019   Lab Results  Component Value Date   HGBA1C 5.1 08/21/2013       Assessment & Plan:   1. Gastroesophageal reflux disease, unspecified whether esophagitis present Symptoms and presentation consistent with gastroesophageal reflux disease.  Is unclear at this time if  symptoms of esophagitis is present however based on symptoms of substernal chest pain it is likely.  Cardiac etiology of chest pain has been ruled out at this time. We will trial 30 days of pantoprazole 40 mg twice daily.  Patient to stop Prilosec at this time. Patient instructed to contact the office in approximately 3 weeks to determine if medication has helped his symptoms or not.  If symptoms have improved we will continue pantoprazole at 40 mg once a day.  If symptoms are not improving at that time will refer patient to GI for further evaluation. Information provided on gastroesophageal reflux disease and management with diet. Patient instructed to follow up sooner if needed. - pantoprazole (PROTONIX) 40 MG tablet; Take 1 tablet (40 mg total) by mouth 2 (two) times daily.  Dispense: 60 tablet; Refill: 0  2. Puncture wound of right hand without foreign body, initial encounter Puncture wound to right hand appears to be healing well without any complications noted.  Patient has good strength and dexterity with thumb and fingers.  Patient is due for Tdap vaccine. Tdap provided today. Patient to follow-up if symptoms worsen or fail to improve. - Tdap vaccine greater than or equal to 7yo IM  3. Need for Tdap vaccination Recent puncture wound to right hand.  Appears to be healing well without any complication.  Patient is due for Tdap vaccine. - Tdap vaccine greater than or equal to 7yo IM  Meds ordered this encounter  Medications  . pantoprazole (PROTONIX) 40 MG tablet    Sig: Take 1 tablet (40 mg total) by mouth 2 (two) times daily.    Dispense:  60 tablet    Refill:  0    Order Specific Question:   Supervising Provider    Answer:   DESHANNON, DEMARCE [[id number]]     Orma Render, NP

## 2019-07-01 ENCOUNTER — Ambulatory Visit: Payer: Medicare Other | Admitting: Physician Assistant

## 2019-07-08 ENCOUNTER — Encounter: Payer: Self-pay | Admitting: Cardiology

## 2019-07-08 ENCOUNTER — Other Ambulatory Visit: Payer: Self-pay

## 2019-07-08 ENCOUNTER — Ambulatory Visit: Payer: Medicare Other | Admitting: Cardiology

## 2019-07-08 VITALS — BP 110/78 | HR 67 | Ht 70.0 in | Wt 214.4 lb

## 2019-07-08 DIAGNOSIS — Z9889 Other specified postprocedural states: Secondary | ICD-10-CM

## 2019-07-08 DIAGNOSIS — Z8679 Personal history of other diseases of the circulatory system: Secondary | ICD-10-CM | POA: Diagnosis not present

## 2019-07-08 DIAGNOSIS — I480 Paroxysmal atrial fibrillation: Secondary | ICD-10-CM

## 2019-07-08 DIAGNOSIS — I2510 Atherosclerotic heart disease of native coronary artery without angina pectoris: Secondary | ICD-10-CM | POA: Diagnosis not present

## 2019-07-08 NOTE — Progress Notes (Signed)
Cardiology Office Note:    Date:  07/08/2019   ID:  ZED KANALEY, DOB [birth year], MRN [id number]  PCP:  Donella Stade, PA-C  Cardiologist:  Candee Furbish, MD  Electrophysiologist:  Constance Haw, MD   Referring MD: Donella Stade, PA-C     History of Present Illness:    Stephen King is a 69 y.o. male here for follow-up of cardiac catheterization from 06/20/2019: 1. Non-obstructive coronary artery disease, with mild to moderate disease involving the LCx and RCA. 2. Widely patent LCx stent. 3. Normal left ventricular systolic function with upper normal to mildly elevated filling pressure.  Recommendations: 1. Medical therapy for secondary prevention of coronary artery disease.  I do not see a critical lesion to explain rest pain. 2. If no evidence of bleeding or vascular complication at right radial arteriotomy site, rivaroxaban can be restarted tomorrow. 3. Empiric trial of PPI, as atypical chest pain may be related to hiatal hernia and a component of GERD/esophagitis. Diagnostic Dominance: Right    Prior unsuccessful cardioversion for atrial fibrillation.  Had an ablation in 2019 by Dr. Curt Bears.   F4 mechani   Past Medical History:  Diagnosis Date  . Arthritis    "?right knee" (08/13/2017)  . Dysrhythmia    PAF  . Hernia, umbilical   . History of hiatal hernia   . History of kidney stones   . Stroke [facility]) 12/2009   "has to watch where he's walking since; more of a spacial thing resulting from stroke" (08/13/2017)    Past Surgical History:  Procedure Laterality Date  . ABDOMINAL HERNIA REPAIR     "has a mesh in his abdomen"  . ANTERIOR CERVICAL DECOMP/DISCECTOMY FUSION  10/2005   C6-7  . ATRIAL FIBRILLATION ABLATION N/A 09/17/2017   Procedure: ATRIAL FIBRILLATION ABLATION;  Surgeon: Constance Haw, MD;  Location: [place] [facility];  Service: Cardiovascular;  Laterality: N/A;  . BACK SURGERY    . CARDIAC CATHETERIZATION  2003  . CATARACT  EXTRACTION W/ INTRAOCULAR LENS  IMPLANT, BILATERAL Bilateral   . CORONARY ANGIOPLASTY WITH STENT PLACEMENT  08/13/2017  . CORONARY STENT INTERVENTION N/A 08/13/2017   Procedure: CORONARY STENT INTERVENTION;  Surgeon: Burnell Blanks, MD;  Location: [place] [facility];  Service: Cardiovascular;  Laterality: N/A;  . EXCISIONAL HEMORRHOIDECTOMY    . HERNIA REPAIR    . LEFT HEART CATH AND CORONARY ANGIOGRAPHY N/A 08/13/2017   Procedure: LEFT HEART CATH AND CORONARY ANGIOGRAPHY;  Surgeon: Burnell Blanks, MD;  Location: [place] [facility];  Service: Cardiovascular;  Laterality: N/A;  . LEFT HEART CATH AND CORONARY ANGIOGRAPHY N/A 06/20/2019   Procedure: LEFT HEART CATH AND CORONARY ANGIOGRAPHY;  Surgeon: Nelva Bush, MD;  Location: [place] [facility];  Service: Cardiovascular;  Laterality: N/A;  . SHOULDER ARTHROSCOPY WITH ROTATOR CUFF REPAIR Right 10/30/2014   Procedure: SHOULDER ARTHROSCOPY WITH ROTATOR CUFF REPAIR;  Surgeon: Tania Ade, MD;  Location: [place];  Service: Orthopedics;  Laterality: Right;  . SHOULDER ARTHROSCOPY WITH SUBACROMIAL DECOMPRESSION Right 10/30/2014   Procedure: Right shoulder arthroscopy rotator cuff repair, subacromial decompression;  Surgeon: Tania Ade, MD;  Location: [place];  Service: Orthopedics;  Laterality: Right;  Right shoulder arthrscopy rotator cuff repair, subacromial decompression    Current Medications: Current Meds  Medication Sig  . atorvastatin (LIPITOR) 80 MG tablet TAKE 1 TABLET BY MOUTH  DAILY  . fluticasone (FLONASE) 50 MCG/ACT nasal spray Place 2 sprays into both nostrils  daily.  . nitroGLYCERIN (NITROSTAT) 0.4 MG SL tablet Place 0.4 mg under the tongue every 5 (five) minutes x 3 doses as needed for chest pain.  . pantoprazole (PROTONIX) 40 MG tablet TAKE 1 TABLET(40 MG) BY MOUTH TWICE DAILY  . XARELTO 20 MG TABS tablet TAKE 1 TABLET BY MOUTH  DAILY WITH SUPPER   Current  Facility-Administered Medications for the 07/08/19 encounter (Office Visit) with Jerline Pain, MD  Medication  . sodium chloride flush (NS) 0.9 % injection 3 mL     Allergies:   Feldene [piroxicam]   Social History   Socioeconomic History  . Marital status: Married    Spouse name: Not on file  . Number of children: Not on file  . Years of education: Not on file  . Highest education level: Not on file  Occupational History  . Not on file  Tobacco Use  . Smoking status: Former Smoker    Years: 27.00    Types: Cigarettes    Quit date: 03/25/1999    Years since quitting: 20.3  . Smokeless tobacco: Never Used  Substance and Sexual Activity  . Alcohol use: No  . Drug use: No  . Sexual activity: Not Currently  Other Topics Concern  . Not on file  Social History Narrative  . Not on file   Social Determinants of Health   Financial Resource Strain:   . Difficulty of Paying Living Expenses:   Food Insecurity:   . Worried About Charity fundraiser in the Last Year:   . Arboriculturist in the Last Year:   Transportation Needs:   . Film/video editor (Medical):   Marland Kitchen Lack of Transportation (Non-Medical):   Physical Activity:   . Days of Exercise per Week:   . Minutes of Exercise per Session:   Stress:   . Feeling of Stress :   Social Connections:   . Frequency of Communication with Friends and Family:   . Frequency of Social Gatherings with Friends and Family:   . Attends Religious Services:   . Active Member of Clubs or Organizations:   . Attends Archivist Meetings:   Marland Kitchen Marital Status:      Family History: The patient's family history includes Alcohol abuse in his father and mother; Arrhythmia in his mother; Colon cancer in his father; Stroke in his maternal grandfather.  ROS:   Please see the history of present illness.     All other systems reviewed and are negative.  EKGs/Labs/Other Studies Reviewed:    The following studies were reviewed  today: cath  EKG:  EKG is not ordered today.    Recent Labs: 06/16/2019: ALT 19; BUN 10; Creatinine, Ser 0.85; Hemoglobin 15.3; Platelets 257; Potassium 4.1; Sodium 141; TSH 1.020  Recent Lipid Panel    Component Value Date/Time   CHOL 97 (L) 06/16/2019 1121   TRIG 79 06/16/2019 1121   HDL 45 06/16/2019 1121   CHOLHDL 2.2 06/16/2019 1121   CHOLHDL 2.3 11/27/2017 1127   VLDL 8 11/02/2015 0804   LDLCALC 36 06/16/2019 1121   LDLCALC 44 11/27/2017 1127    Physical Exam:    VS:  BP 110/78   Pulse 67   Ht 5\' 10"  (1.778 m)   Wt 214 lb 6.4 oz (97.3 kg)   SpO2 97%   BMI 30.76 kg/m     Wt Readings from Last 3 Encounters:  07/08/19 214 lb 6.4 oz (97.3 kg)  06/30/19 213 lb (96.6 kg)  06/20/19 210 lb (95.3 kg)     GEN:  Well nourished, well developed in no acute distress HEENT: Normal NECK: No JVD; No carotid bruits LYMPHATICS: No lymphadenopathy CARDIAC: RRR, no murmurs, rubs, gallops RESPIRATORY:  Clear to auscultation without rales, wheezing or rhonchi  ABDOMEN: Soft, non-tender, non-distended MUSCULOSKELETAL:  No edema; No deformity  SKIN: Warm and dry NEUROLOGIC:  Alert and oriented x 3 PSYCHIATRIC:  Normal affect   ASSESSMENT:    No diagnosis found. PLAN:    In order of problems listed above:  Coronary artery disease -Circumflex stent is widely patent from heart catheterization as described above.  Excellent.  Symptoms were likely GERD related.  He is feeling better with the Protonix.  CBC was reassuring.  Continue with aggressive secondary risk factor prevention. -We stopped metoprolol previously because of hypotension.  His heart rate seems very reasonable currently 67.  Paroxysmal atrial fibrillation -Dr. Curt Bears ablation.  CHA2DS2-VASc 4. -Xarelto.  Doing well.  No bleeding.  Hemoglobin  Obstructive sleep apnea -CPAP  Prior stroke 2011 -Importance of anticoagulation.  Statin.  Hyperlipidemia -LDL 44, excellent continue with high intensity  dose.  GERD -Likely because of his previous symptoms.  Feeling better.  1 year follow-up.  Medication Adjustments/Labs and Tests Ordered: Current medicines are reviewed at length with the patient today.  Concerns regarding medicines are outlined above.  No orders of the defined types were placed in this encounter.  No orders of the defined types were placed in this encounter.   Patient Instructions  Medication Instructions:  Your physician recommends that you continue on your current medications as directed. Please refer to the Current Medication list given to you today.  *If you need a refill on your cardiac medications before your next appointment, please call your pharmacy*   Lab Work: None ordered  If you have labs (blood work) drawn today and your tests are completely normal, you will receive your results only by: Marland Kitchen MyChart Message (if you have MyChart) OR . A paper copy in the mail If you have any lab test that is abnormal or we need to change your treatment, we will call you to review the results.   Testing/Procedures: None ordered   Follow-Up: At [facility], you and your health needs are our priority.  As part of our continuing mission to provide you with exceptional heart care, we have created designated Provider Care Teams.  These Care Teams include your primary Cardiologist (physician) and Advanced Practice Providers (APPs -  Physician Assistants and Nurse Practitioners) who all work together to provide you with the care you need, when you need it.  We recommend signing up for the patient portal called "MyChart".  Sign up information is provided on this After Visit Summary.  MyChart is used to connect with patients for Virtual Visits (Telemedicine).  Patients are able to view lab/test results, encounter notes, upcoming appointments, etc.  Non-urgent messages can be sent to your provider as well.   To learn more about what you can do with MyChart, go to  [email or web address].    Your next appointment:   12 month(s)  The format for your next appointment:   In Person  Provider:   You may see Candee Furbish, MD or one of the following Advanced Practice Providers on your designated Care Team:    Truitt Merle, NP  Cecilie Kicks, NP  Kathyrn Drown, NP    Other Instructions       Signed, Candee Furbish, MD  07/08/2019 9:30  AM    [place]

## 2019-07-08 NOTE — Patient Instructions (Signed)
Medication Instructions:  Your physician recommends that you continue on your current medications as directed. Please refer to the Current Medication list given to you today.  *If you need a refill on your cardiac medications before your next appointment, please call your pharmacy*   Lab Work: None ordered  If you have labs (blood work) drawn today and your tests are completely normal, you will receive your results only by: . MyChart Message (if you have MyChart) OR . A paper copy in the mail If you have any lab test that is abnormal or we need to change your treatment, we will call you to review the results.   Testing/Procedures: None ordered   Follow-Up: At [facility], you and your health needs are our priority.  As part of our continuing mission to provide you with exceptional heart care, we have created designated Provider Care Teams.  These Care Teams include your primary Cardiologist (physician) and Advanced Practice Providers (APPs -  Physician Assistants and Nurse Practitioners) who all work together to provide you with the care you need, when you need it.  We recommend signing up for the patient portal called "MyChart".  Sign up information is provided on this After Visit Summary.  MyChart is used to connect with patients for Virtual Visits (Telemedicine).  Patients are able to view lab/test results, encounter notes, upcoming appointments, etc.  Non-urgent messages can be sent to your provider as well.   To learn more about what you can do with MyChart, go to [email or web address].    Your next appointment:   12 month(s)  The format for your next appointment:   In Person  Provider:   You may see Mark Skains, MD or one of the following Advanced Practice Providers on your designated Care Team:    Lori Gerhardt, NP  Laura Ingold, NP  Jill McDaniel, NP    Other Instructions   

## 2019-07-29 ENCOUNTER — Encounter: Payer: Self-pay | Admitting: Nurse Practitioner

## 2019-12-05 ENCOUNTER — Other Ambulatory Visit: Payer: Self-pay | Admitting: Cardiology

## 2019-12-06 NOTE — Telephone Encounter (Signed)
Pt saw Dr Marlou Porch 07/08/19.  Labs done on 06/16/19.  SCr 0.85  Hgb 15.3  Hct 43.0  Plts 257  Age 69  Weight 97.3kg Based on above Xarelto 20mg  daily is appropriate dose.  Refill approved.

## 2019-12-21 ENCOUNTER — Other Ambulatory Visit: Payer: Self-pay | Admitting: Cardiology

## 2019-12-22 ENCOUNTER — Encounter: Payer: Self-pay | Admitting: Physician Assistant

## 2019-12-28 ENCOUNTER — Encounter: Payer: Self-pay | Admitting: Physician Assistant

## 2019-12-28 ENCOUNTER — Ambulatory Visit (INDEPENDENT_AMBULATORY_CARE_PROVIDER_SITE_OTHER): Payer: Medicare Other | Admitting: Physician Assistant

## 2019-12-28 ENCOUNTER — Other Ambulatory Visit: Payer: Self-pay

## 2019-12-28 VITALS — BP 121/70 | HR 61 | Ht 70.0 in | Wt 218.0 lb

## 2019-12-28 DIAGNOSIS — Z77090 Contact with and (suspected) exposure to asbestos: Secondary | ICD-10-CM | POA: Diagnosis not present

## 2019-12-28 DIAGNOSIS — Z87891 Personal history of nicotine dependence: Secondary | ICD-10-CM

## 2019-12-28 DIAGNOSIS — L570 Actinic keratosis: Secondary | ICD-10-CM

## 2019-12-28 DIAGNOSIS — L821 Other seborrheic keratosis: Secondary | ICD-10-CM | POA: Diagnosis not present

## 2019-12-28 DIAGNOSIS — Z0000 Encounter for general adult medical examination without abnormal findings: Secondary | ICD-10-CM

## 2019-12-28 NOTE — Progress Notes (Addendum)
Subjective:    Patient ID: Stephen King, male    DOB: Aug 12, 1950, 69 y.o.   MRN: [id number]  HPI Stephen King is a pleasant 69 year old male presenting for his annual physical. He has no concerning health problems at this time.  .. Patient Active Problem List   Diagnosis Date Noted  . Actinic keratosis 12/30/2019  . Asbestos exposure 12/30/2019  . Former smoker 12/30/2019  . Seborrheic keratoses 12/30/2019  . Prepatellar bursitis of left knee 01/21/2019  . 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 OSA (obstructive sleep apnea) 11/17/2017  . Hemorrhagic prepatellar bursitis of left knee 11/17/2017  . Atrial fibrillation ([place]) 08/16/2017  . CAD S/P percutaneous coronary angioplasty 08/14/2017  . Unstable angina ([place])   . Atrial flutter ([place]) 07/28/2017  . No energy 08/11/2016  . Malaise 08/11/2016  . Multiple joint pain 03/10/2016  . Weakness 03/10/2016  . Myalgia 03/10/2016  . left knee pain 10/26/2015  . Pre-operative cardiovascular examination 10/27/2014  . Acute bronchitis 10/19/2014  . Right shoulder pain 11/04/2013  . 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 Primary osteoarthritis of both knees 10/07/2013  . History of CVA (cerebrovascular accident) 10/06/2013  . Chronic anticoagulation 10/06/2013  . PAF (paroxysmal atrial fibrillation) ([place]) 08/29/2013  . Atypical nevus 08/29/2013  . Chest pain 08/21/2013  . Atrial fibrillation with RVR ([place]) 08/21/2013  . 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 Mass of right side of neck 06/06/2013  . 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 Urgency of urination 08/26/2011  . PARESTHESIA 12/24/2010  . HEAD TRAUMA, CLOSED 12/24/2010     Review of Systems  Constitutional: Negative for chills, fatigue and fever.  HENT: Negative.   Respiratory: Negative.   Cardiovascular: Negative.   Gastrointestinal: Negative.   Genitourinary: Negative.   Musculoskeletal: Negative.   Neurological: Negative.   Hematological: Negative.   Psychiatric/Behavioral: Negative.         Objective:   Physical Exam Constitutional:      Appearance: Normal appearance.  HENT:     Head: Normocephalic and atraumatic.     Right Ear: Tympanic membrane, ear canal and external ear normal.     Left Ear: Tympanic membrane, ear canal and external ear normal.     Nose: Nose normal.  Eyes:     Extraocular Movements: Extraocular movements intact.  Cardiovascular:     Rate and Rhythm: Normal rate and regular rhythm.     Pulses: Normal pulses.     Heart sounds: Normal heart sounds.  Pulmonary:     Effort: Pulmonary effort is normal.     Breath sounds: Normal breath sounds.  Abdominal:     General: Abdomen is flat. Bowel sounds are normal.     Palpations: Abdomen is soft.  Musculoskeletal:        General: Normal range of motion.     Cervical back: Normal range of motion and neck supple. No rigidity.  Skin:    General: Skin is warm and dry.     Findings: Lesion present.     Comments: Sporadic seborrheic keratoses on the arms lower legs, hands and face   Neurological:     General: No focal deficit present.     Mental Status: He is alert and oriented to person, place, and time.  Psychiatric:        Mood and Affect: Mood normal.        Behavior: Behavior normal.        Thought Content: Thought content normal.     .Marland Kitchen  Vitals:   12/28/19 1440  BP: 121/70  Pulse: 61  Height: 5\' 10"  (1.778 m)  Weight: 218 lb (98.9 kg)  SpO2: 100%  BMI (Calculated): 31.28    .Marland Kitchen Depression screen [facility] 2/9 12/28/2019 01/19/2019 11/27/2017 07/28/2017  Decreased Interest 1 0 1 1  Down, Depressed, Hopeless 1 0 0 1  PHQ - 2 Score 2 0 1 2  Altered sleeping 1 1 - 1  Tired, decreased energy 1 1 - 2  Change in appetite 1 0 - 1  Feeling bad or failure about yourself  0 0 - 0  Trouble concentrating 0 0 - 1  Moving slowly or fidgety/restless 0 0 - 2  Suicidal thoughts 0 0 - 0  PHQ-9 Score 5 2 - 9  Difficult doing work/chores Not difficult at all Not difficult at all - Somewhat difficult    .Marland Kitchen GAD 7 : Generalized Anxiety Score 12/28/2019 01/19/2019  Nervous, Anxious, on Edge 1 1  Control/stop worrying 0 1  Worry too much - different things 0 1  Trouble relaxing 0 0  Restless 0 0  Easily annoyed or irritable 1 1  Afraid - awful might happen 0 0  Total GAD 7 Score 2 4  Anxiety Difficulty Not difficult at all Not difficult at all          Assessment & Plan:  .[name] was seen today for annual exam.  Diagnoses and all orders for this visit:  Annual physical exam  Seborrheic keratoses  Former smoker -     Ambulatory Referral for [place]  Asbestos exposure -     Ambulatory Referral for Lung Cancer Scre  Actinic keratosis  .Marland Kitchen Discussed 150 minutes of exercise a week.  Encouraged vitamin D 1000 units and Calcium 1300mg  or 4 servings of dairy a day.   Fasting labs UTD.  Colonoscopy UTD.  ..  Stephen King Name 12/28/19 1500         During the last Month   Sensation of Bladder not Empty Not at all     Urinate<2 hours after last Less than 1 time in 5     Mult. stop/start when voiding Not at all     Difficult to postpone voiding Not at all     Weak urinary stream Less than 1 time in 5     Push/strain to begin urination Not at all     Times per night up to urinate Less than 1 time in 5       OTHER   Total Score 3          PSA normal.  covid vaccine UTD with booster.  Needs flu/shingles/pneumonia. Discussed needs to be 2 weeks after covid booster.   Pt does have smoking history and abestos exposure I will refer for lung cancer screening consideration.   Cryotherapy Procedure Note  Pre-operative Diagnosis: Actinic keratosis  Post-operative Diagnosis: Actinic keratosis  Locations: bilateral arms and dorsal hands  Indications: pre cancersous  Procedure Details  History of allergy to iodine: no. Pacemaker? no.  Patient informed of risks (permanent scarring, infection, light or dark discoloration, bleeding, infection,  weakness, numbness and recurrence of the lesion) and benefits of the procedure and verbal informed consent obtained.  The areas are treated with liquid nitrogen therapy, frozen until ice ball extended 2 mm beyond lesion, allowed to thaw, and treated again. The patient tolerated procedure well.  The patient was instructed on post-op care, warned that there may  be blister formation, redness and pain. Recommend OTC analgesia as needed for pain.  Condition: Stable  Complications: none.  Plan: 1. Instructed to keep the area dry and covered for 24-48h and clean thereafter. 2. Warning signs of infection were reviewed.   3. Recommended that the patient use OTC acetaminophen as needed for pain.    Follow up in 1 year.   Marland KitchenVernetta Honey PA-C, have reviewed and agree with the above documentation in it's entirety.

## 2019-12-28 NOTE — Patient Instructions (Addendum)
Health Maintenance After Age 68 After age 68, you are at a higher risk for certain long-term diseases and infections as well as injuries from falls. Falls are a major cause of broken bones and head injuries in people who are older than age 68. Getting regular preventive care can help to keep you healthy and well. Preventive care includes getting regular testing and making lifestyle changes as recommended by your health care provider. Talk with your health care provider about:  Which screenings and tests you should have. A screening is a test that checks for a disease when you have no symptoms.  A diet and exercise plan that is right for you. What should I know about screenings and tests to prevent falls? Screening and testing are the best ways to find a health problem early. Early diagnosis and treatment give you the best chance of managing medical conditions that are common after age 68. Certain conditions and lifestyle choices may make you more likely to have a fall. Your health care provider may recommend:  Regular vision checks. Poor vision and conditions such as cataracts can make you more likely to have a fall. If you wear glasses, make sure to get your prescription updated if your vision changes.  Medicine review. Work with your health care provider to regularly review all of the medicines you are taking, including over-the-counter medicines. Ask your health care provider about any side effects that may make you more likely to have a fall. Tell your health care provider if any medicines that you take make you feel dizzy or sleepy.  Osteoporosis screening. Osteoporosis is a condition that causes the bones to get weaker. This can make the bones weak and cause them to break more easily.  Blood pressure screening. Blood pressure changes and medicines to control blood pressure can make you feel dizzy.  Strength and balance checks. Your health care provider may recommend certain tests to check your  strength and balance while standing, walking, or changing positions.  Foot health exam. Foot pain and numbness, as well as not wearing proper footwear, can make you more likely to have a fall.  Depression screening. You may be more likely to have a fall if you have a fear of falling, feel emotionally low, or feel unable to do activities that you used to do.  Alcohol use screening. Using too much alcohol can affect your balance and may make you more likely to have a fall. What actions can I take to lower my risk of falls? General instructions  Talk with your health care provider about your risks for falling. Tell your health care provider if: ? You fall. Be sure to tell your health care provider about all falls, even ones that seem minor. ? You feel dizzy, sleepy, or off-balance.  Take over-the-counter and prescription medicines only as told by your health care provider. These include any supplements.  Eat a healthy diet and maintain a healthy weight. A healthy diet includes low-fat dairy products, low-fat (lean) meats, and fiber from whole grains, beans, and lots of fruits and vegetables. Home safety  Remove any tripping hazards, such as rugs, cords, and clutter.  Install safety equipment such as grab bars in bathrooms and safety rails on stairs.  Keep rooms and walkways well-lit. Activity   Follow a regular exercise program to stay fit. This will help you maintain your balance. Ask your health care provider what types of exercise are appropriate for you.  If you need a cane or   walker, use it as recommended by your health care provider.  Wear supportive shoes that have nonskid soles. Lifestyle  Do not drink alcohol if your health care provider tells you not to drink.  If you drink alcohol, limit how much you have: ? 0-1 drink a day for women. ? 0-2 drinks a day for men.  Be aware of how much alcohol is in your drink. In the [place] one drink equals one typical bottle of beer (12  oz), one-half glass of wine (5 oz), or one shot of hard liquor (1 oz).  Do not use any products that contain nicotine or tobacco, such as cigarettes and e-cigarettes. If you need help quitting, ask your health care provider. Summary  Having a healthy lifestyle and getting preventive care can help to protect your health and wellness after age 10.  Screening and testing are the best way to find a health problem early and help you avoid having a fall. Early diagnosis and treatment give you the best chance for managing medical conditions that are more common for people who are older than age 24.  Falls are a major cause of broken bones and head injuries in people who are older than age 22. Take precautions to prevent a fall at home.  Work with your health care provider to learn what changes you can make to improve your health and wellness and to prevent falls. This information is not intended to replace advice given to you by your health care provider. Make sure you discuss any questions you have with your health care provider. Document Revised: 07/01/2018 Document Reviewed: 01/21/2017 Elsevier Patient Education  [place] 2 dose series. Pharmacy.  Flu shot every year.  Last pneumonia shot.   Consider low dose lung screening.

## 2019-12-30 DIAGNOSIS — Z77090 Contact with and (suspected) exposure to asbestos: Secondary | ICD-10-CM | POA: Insufficient documentation

## 2019-12-30 DIAGNOSIS — Z87891 Personal history of nicotine dependence: Secondary | ICD-10-CM | POA: Insufficient documentation

## 2019-12-30 DIAGNOSIS — L821 Other seborrheic keratosis: Secondary | ICD-10-CM | POA: Insufficient documentation

## 2019-12-30 DIAGNOSIS — L570 Actinic keratosis: Secondary | ICD-10-CM | POA: Insufficient documentation

## 2020-06-17 ENCOUNTER — Encounter: Payer: Self-pay | Admitting: Physician Assistant

## 2020-07-22 ENCOUNTER — Encounter: Payer: Self-pay | Admitting: Physician Assistant

## 2020-07-25 ENCOUNTER — Other Ambulatory Visit: Payer: Self-pay

## 2020-07-25 ENCOUNTER — Ambulatory Visit (INDEPENDENT_AMBULATORY_CARE_PROVIDER_SITE_OTHER): Payer: Medicare Other

## 2020-07-25 ENCOUNTER — Ambulatory Visit (INDEPENDENT_AMBULATORY_CARE_PROVIDER_SITE_OTHER): Payer: Medicare Other | Admitting: Physician Assistant

## 2020-07-25 ENCOUNTER — Encounter: Payer: Self-pay | Admitting: Physician Assistant

## 2020-07-25 VITALS — BP 126/77 | HR 67 | Ht 70.0 in | Wt 222.0 lb

## 2020-07-25 DIAGNOSIS — I4891 Unspecified atrial fibrillation: Secondary | ICD-10-CM | POA: Diagnosis not present

## 2020-07-25 DIAGNOSIS — Z125 Encounter for screening for malignant neoplasm of prostate: Secondary | ICD-10-CM | POA: Diagnosis not present

## 2020-07-25 DIAGNOSIS — Z131 Encounter for screening for diabetes mellitus: Secondary | ICD-10-CM | POA: Diagnosis not present

## 2020-07-25 DIAGNOSIS — R222 Localized swelling, mass and lump, trunk: Secondary | ICD-10-CM | POA: Diagnosis not present

## 2020-07-25 DIAGNOSIS — M5412 Radiculopathy, cervical region: Secondary | ICD-10-CM

## 2020-07-25 DIAGNOSIS — M25512 Pain in left shoulder: Secondary | ICD-10-CM

## 2020-07-25 DIAGNOSIS — M25522 Pain in left elbow: Secondary | ICD-10-CM

## 2020-07-25 DIAGNOSIS — M4322 Fusion of spine, cervical region: Secondary | ICD-10-CM | POA: Diagnosis not present

## 2020-07-25 DIAGNOSIS — Z981 Arthrodesis status: Secondary | ICD-10-CM | POA: Diagnosis not present

## 2020-07-25 DIAGNOSIS — M50121 Cervical disc disorder at C4-C5 level with radiculopathy: Secondary | ICD-10-CM | POA: Diagnosis not present

## 2020-07-25 DIAGNOSIS — M25562 Pain in left knee: Secondary | ICD-10-CM

## 2020-07-25 MED ORDER — GABAPENTIN 300 MG PO CAPS: ORAL_CAPSULE | ORAL | 0 refills | Status: DC

## 2020-07-25 MED ORDER — PREDNISONE 50 MG PO TABS: ORAL_TABLET | ORAL | 0 refills | Status: DC

## 2020-07-25 MED ORDER — TRAMADOL HCL 50 MG PO TABS: 50.0000 mg | ORAL_TABLET | Freq: Four times a day (QID) | ORAL | 0 refills | Status: AC | PRN

## 2020-07-25 MED ORDER — CYCLOBENZAPRINE HCL 10 MG PO TABS: 10.0000 mg | ORAL_TABLET | Freq: Three times a day (TID) | ORAL | 0 refills | Status: DC | PRN

## 2020-07-25 NOTE — Progress Notes (Signed)
Subjective:    Patient ID: Stephen King, male    DOB: Apr 20, 1950, 70 y.o.   MRN: [id number]  HPI  Pt is a 70 yo male with hx of C5-C7 fusion who presents with worsening left shoulder and elbow pain with numbness and tingling into 2nd-4th metatarsals. Worsening over 1-2 months. 8/10 pain at times. He is actively with lifting at work.   Pt asked about PSA. He friends were all being checked. He denies any urinary symptoms or issues.   .. Active Ambulatory Problems    Diagnosis Date Noted  . PARESTHESIA 12/24/2010  . HEAD TRAUMA, CLOSED 12/24/2010  . 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 Urgency of urination 08/26/2011  . Mass of right side of neck 06/06/2013  . Chest pain 08/21/2013  . Atrial fibrillation with RVR ([place]) 08/21/2013  . 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 PAF (paroxysmal atrial fibrillation) ([place]) 08/29/2013  . Atypical nevus 08/29/2013  . History of CVA (cerebrovascular accident) 10/06/2013  . Chronic anticoagulation 10/06/2013  . Primary osteoarthritis of both knees 10/07/2013  . 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 Right shoulder pain 11/04/2013  . Acute bronchitis 10/19/2014  . Pre-operative cardiovascular examination 10/27/2014  . left knee pain 10/26/2015  . Multiple joint pain 03/10/2016  . Weakness 03/10/2016  . Myalgia 03/10/2016  . No energy 08/11/2016  . Malaise 08/11/2016  . Atrial flutter ([place]) 07/28/2017  . Unstable angina ([place])   . CAD S/P percutaneous coronary angioplasty 08/14/2017  . Atrial fibrillation ([place]) 08/16/2017  . OSA (obstructive sleep apnea) 11/17/2017  . Hemorrhagic prepatellar bursitis of left knee 11/17/2017  . Prepatellar bursitis of left knee 01/21/2019  . 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 Actinic keratosis 12/30/2019  . Asbestos exposure 12/30/2019  . Former smoker 12/30/2019  . Seborrheic keratoses 12/30/2019   Resolved Ambulatory Problems    Diagnosis Date Noted  . Osteoarthritis of left knee 10/26/2015  . Paroxysmal  atrial fibrillation ([place]) 09/17/2017   Past Medical History:  Diagnosis Date  . Arthritis   . Dysrhythmia   . Hernia, umbilical   . History of hiatal hernia   . History of kidney stones   . Stroke [facility]) 12/2009      Review of Systems See HPI.     Objective:   Physical Exam Vitals reviewed.  Constitutional:      Appearance: Normal appearance.  HENT:     Head: Normocephalic.  Cardiovascular:     Rate and Rhythm: Normal rate and regular rhythm.  Pulmonary:     Effort: Pulmonary effort is normal.  Musculoskeletal:     Comments: NROM of neck.  NROM of left shoulder NROM of left elbow No tenderness to palpation.  Left upper ext strength 3/5.  Negative drop arm sign.   Neurological:     General: No focal deficit present.     Mental Status: He is alert and oriented to person, place, and time.       ..  [place] Name 07/25/20 1100         During the last Month   Sensation of Bladder not Empty Less than 1 time in 5     Urinate<2 hours after last Less than 1 time in 5     Mult. stop/start when voiding Less than half the time     Difficult to postpone voiding Less than 1 time in 5     Weak urinary stream Less than half the time  Push/strain to begin urination Less than 1 time in 5     Times per night up to urinate Less than 1 time in 5           OTHER   Total Score 9               Assessment & Plan:  .[name] was seen today for pain.  Diagnoses and all orders for this visit:  Acute pain of left shoulder -     DG Cervical Spine Complete; Future -     cyclobenzaprine (FLEXERIL) 10 MG tablet; Take 1 tablet (10 mg total) by mouth 3 (three) times daily as needed for muscle spasms.  Left elbow pain -     DG Cervical Spine Complete; Future -     cyclobenzaprine (FLEXERIL) 10 MG tablet; Take 1 tablet (10 mg total) by mouth 3 (three) times daily as needed for muscle spasms.  Acute pain of left knee  Screening PSA (prostate specific  antigen) -     PSA -     CBC with Differential/Platelet  Screening for diabetes mellitus -     COMPLETE METABOLIC PANEL WITH GFR  Cervical radiculitis -     DG Cervical Spine Complete; Future -     cyclobenzaprine (FLEXERIL) 10 MG tablet; Take 1 tablet (10 mg total) by mouth 3 (three) times daily as needed for muscle spasms. -     gabapentin (NEURONTIN) 300 MG capsule; One tab PO qHS for a week, then BID for a week, then TID. May double weekly to a max of 3,600mg /day -     traMADol (ULTRAM) 50 MG tablet; Take 1 tablet (50 mg total) by mouth every 6 (six) hours as needed for up to 5 days. -     predniSONE (DELTASONE) 50 MG tablet; One tab PO daily for 5 days. -     Ambulatory referral to Physical Therapy  History of fusion of cervical spine -     DG Cervical Spine Complete; Future -     cyclobenzaprine (FLEXERIL) 10 MG tablet; Take 1 tablet (10 mg total) by mouth 3 (three) times daily as needed for muscle spasms.   Suspect C8 radiculopathy.  Prednisone burst given.  Start gabapentin.  Repeat cervical xray.  Start formal PT. Tramadol for break through pain.  Marland Kitchen.PDMP reviewed during this encounter.  Follow up with Dr. Darene Lamer in 4 weeks.    AUA up a little more than expected since pt says he has no urinary symptoms.  PSA ordered to check for stability.

## 2020-07-25 NOTE — Patient Instructions (Addendum)
Refer to PT.  Get xray today.  Start gabapentin.  Take 5 days or prednisone.  Tramadol a needed.    Cervical Radiculopathy  Cervical radiculopathy means that a nerve in the neck (a cervical nerve) is pinched or bruised. This can happen because of an injury to the cervical spine (vertebrae) in the neck, or as a normal part of getting older. This can cause pain or loss of feeling (numbness) that runs from your neck all the way down to your arm and fingers. Often, this condition gets better with rest. Treatment may be needed if the condition does not get better. What are the causes?  A neck injury.  A bulging disk in your spine.  Muscle movements that you cannot control (muscle spasms).  Tight muscles in your neck due to overuse.  Arthritis.  Breakdown in the bones and joints of the spine (spondylosis) due to getting older.  Bone spurs that form near the nerves in the neck. What are the signs or symptoms?  Pain. The pain may: ? Run from the neck to the arm and hand. ? Be very bad or irritating. ? Be worse when you move your neck.  Loss of feeling or tingling in your arm or hand.  Weakness in your arm or hand, in very bad cases. How is this treated? In many cases, treatment is not needed for this condition. With rest, the condition often gets better over time. If treatment is needed, options may include:  Wearing a soft neck collar (cervical collar) for short periods of time, as told by your doctor.  Doing exercises (physical therapy) to strengthen your neck muscles.  Taking medicines.  Having shots (injections) in your spine, in very bad cases.  Having surgery. This may be needed if other treatments do not help. The type of surgery that is used depends on the cause of your condition. Follow these instructions at home: If you have a soft neck collar:  Wear it as told by your doctor. Remove it only as told by your doctor.  Ask your doctor if you can remove the collar  for cleaning and bathing. If you are allowed to remove the collar for cleaning or bathing: ? Follow instructions from your doctor about how to remove the collar safely. ? Clean the collar by wiping it with mild soap and water and drying it completely. ? Take out any removable pads in the collar every 1-2 days. Wash them by hand with soap and water. Let them air-dry completely before you put them back in the collar. ? Check your skin under the collar for redness or sores. If you see any, tell your doctor. Managing pain  Take over-the-counter and prescription medicines only as told by your doctor.  If told, put ice on the painful area. ? If you have a soft neck collar, remove it as told by your doctor. ? Put ice in a plastic bag. ? Place a towel between your skin and the bag. ? Leave the ice on for 20 minutes, 2-3 times a day.  If using ice does not help, you can try using heat. Use the heat source that your doctor recommends, such as a moist heat pack or a heating pad. ? Place a towel between your skin and the heat source. ? Leave the heat on for 20-30 minutes. ? Remove the heat if your skin turns bright red. This is very important if you are unable to feel pain, heat, or cold. You may have  a greater risk of getting burned.  You may try a gentle neck and shoulder rub (massage).      Activity  Rest as needed.  Return to your normal activities as told by your doctor. Ask your doctor what activities are safe for you.  Do exercises as told by your doctor or physical therapist.  Do not lift anything that is heavier than 10 lb (4.5 kg) until your doctor tells you that it is safe. General instructions  Use a flat pillow when you sleep.  Do not drive while wearing a soft neck collar. If you do not have a soft neck collar, ask your doctor if it is safe to drive while your neck heals.  Ask your doctor if the medicine prescribed to you requires you to avoid driving or using heavy  machinery.  Do not use any products that contain nicotine or tobacco, such as cigarettes, e-cigarettes, and chewing tobacco. These can delay healing. If you need help quitting, ask your doctor.  Keep all follow-up visits as told by your doctor. This is important. Contact a doctor if:  Your condition does not get better with treatment. Get help right away if:  Your pain gets worse and is not helped with medicine.  You lose feeling or feel weak in your hand, arm, face, or leg.  You have a high fever.  You have a stiff neck.  You cannot control when you poop or pee (have incontinence).  You have trouble with walking, balance, or talking. Summary  Cervical radiculopathy means that a nerve in the neck is pinched or bruised.  A nerve can get pinched from a bulging disk, arthritis, an injury to the neck, or other causes.  Symptoms include pain, tingling, or loss of feeling that goes from the neck into the arm or hand.  Weakness in your arm or hand can happen in very bad cases.  Treatment may include resting, wearing a soft neck collar, and doing exercises. You might need to take medicines for pain. In very bad cases, shots or surgery may be needed. This information is not intended to replace advice given to you by your health care provider. Make sure you discuss any questions you have with your health care provider. Document Revised: 01/29/2018 Document Reviewed: 01/29/2018 Elsevier Patient Education  2021 [employer].

## 2020-07-26 LAB — COMPLETE METABOLIC PANEL WITH GFR
AG Ratio: 1.7 (calc) (ref 1.0–2.5)
ALT: 18 U/L (ref 9–46)
AST: 16 U/L (ref 10–35)
Albumin: 3.9 g/dL (ref 3.6–5.1)
Alkaline phosphatase (APISO): 74 U/L (ref 35–144)
BUN: 14 mg/dL (ref 7–25)
CO2: 26 mmol/L (ref 20–32)
Calcium: 9.1 mg/dL (ref 8.6–10.3)
Chloride: 107 mmol/L (ref 98–110)
Creat: 0.79 mg/dL (ref 0.70–1.25)
GFR, Est African American: 106 mL/min/{1.73_m2} (ref >=60)
GFR, Est Non African American: 92 mL/min/{1.73_m2} (ref >=60)
Globulin: 2.3 g/dL (calc) (ref 1.9–3.7)
Glucose, Bld: 103 mg/dL — ABNORMAL HIGH (ref 65–99)
Potassium: 4.4 mmol/L (ref 3.5–5.3)
Sodium: 140 mmol/L (ref 135–146)
Total Bilirubin: 0.7 mg/dL (ref 0.2–1.2)
Total Protein: 6.2 g/dL (ref 6.1–8.1)

## 2020-07-26 LAB — CBC WITH DIFFERENTIAL/PLATELET
Absolute Monocytes: 760 cells/uL (ref 200–950)
Basophils Absolute: 32 cells/uL (ref 0–200)
Basophils Relative: 0.4 %
Eosinophils Absolute: 96 cells/uL (ref 15–500)
Eosinophils Relative: 1.2 %
HCT: 44.9 % (ref 38.5–50.0)
Hemoglobin: 15.1 g/dL (ref 13.2–17.1)
Lymphs Abs: 2344 cells/uL (ref 850–3900)
MCH: 31.3 pg (ref 27.0–33.0)
MCHC: 33.6 g/dL (ref 32.0–36.0)
MCV: 93 fL (ref 80.0–100.0)
MPV: 10.1 fL (ref 7.5–12.5)
Monocytes Relative: 9.5 %
Neutro Abs: 4768 cells/uL (ref 1500–7800)
Neutrophils Relative %: 59.6 %
Platelets: 281 10*3/uL (ref 140–400)
RBC: 4.83 10*6/uL (ref 4.20–5.80)
RDW: 12.6 % (ref 11.0–15.0)
Total Lymphocyte: 29.3 %
WBC: 8 10*3/uL (ref 3.8–10.8)

## 2020-07-26 LAB — PSA: PSA: 0.8 ng/mL (ref <=4.00)

## 2020-07-26 NOTE — Progress Notes (Signed)
Leandre,   PSA stable and low. No concerns.  Kidney, liver, glucose look great.

## 2020-07-27 NOTE — Progress Notes (Signed)
No acute abnormality found in xray.

## 2020-07-30 ENCOUNTER — Encounter: Payer: Self-pay | Admitting: Physician Assistant

## 2020-08-02 ENCOUNTER — Other Ambulatory Visit: Payer: Self-pay | Admitting: Cardiology

## 2020-08-07 ENCOUNTER — Ambulatory Visit: Payer: Medicare Other | Admitting: Cardiology

## 2020-08-07 ENCOUNTER — Other Ambulatory Visit: Payer: Self-pay

## 2020-08-07 ENCOUNTER — Encounter: Payer: Self-pay | Admitting: Cardiology

## 2020-08-07 VITALS — BP 116/70 | HR 71 | Ht 70.0 in | Wt 225.0 lb

## 2020-08-07 DIAGNOSIS — I2510 Atherosclerotic heart disease of native coronary artery without angina pectoris: Secondary | ICD-10-CM | POA: Diagnosis not present

## 2020-08-07 DIAGNOSIS — Z9889 Other specified postprocedural states: Secondary | ICD-10-CM | POA: Diagnosis not present

## 2020-08-07 DIAGNOSIS — I480 Paroxysmal atrial fibrillation: Secondary | ICD-10-CM | POA: Diagnosis not present

## 2020-08-07 DIAGNOSIS — Z8679 Personal history of other diseases of the circulatory system: Secondary | ICD-10-CM | POA: Diagnosis not present

## 2020-08-07 MED ORDER — METOPROLOL TARTRATE 25 MG PO TABS: 25.0000 mg | ORAL_TABLET | Freq: Four times a day (QID) | ORAL | 3 refills | Status: DC | PRN

## 2020-08-07 NOTE — Progress Notes (Signed)
Cardiology Office Note:    Date:  08/07/2020   ID:  Stephen King, DOB 05/13/1950, MRN [id number]  PCP:  Lavada Mesi   [facility] Providers Cardiologist:  Candee Furbish, MD Electrophysiologist:  Will Meredith Leeds, MD     Referring MD: Donella Stade, PA-C    History of Present Illness:    Stephen King is a 70 y.o. male here for the follow-up of coronary artery disease.  Previously placed left circumflex stent.  He underwent cardiac catheterization 06/20/2019: 1. Non-obstructive coronary artery disease, with mild to moderate disease involving the LCx and RCA. 2. Widely patent LCx stent. 3. Normal left ventricular systolic function with upper normal to mildly elevated filling pressure.  Recommendations: 1. Medical therapy for secondary prevention of coronary artery disease. I do not see a critical lesion to explain rest pain. 2. If no evidence of bleeding or vascular complication at right radial arteriotomy site, rivaroxaban can be restarted tomorrow. 3. Empiric trial of PPI, as atypical chest pain may be related to hiatal hernia and a component of GERD/esophagitis. Diagnostic Dominance: Right    Prior unsuccessful cardioversion for atrial fibrillation.  Had an ablation in 2019 by Dr. Curt Bears.   Overall feels well, no fevers chills nausea vomiting syncope bleeding  Past Medical History:  Diagnosis Date  . Arthritis    "?right knee" (08/13/2017)  . Dysrhythmia    PAF  . Hernia, umbilical   . History of hiatal hernia   . History of kidney stones   . Stroke [facility]) 12/2009   "has to watch where he's walking since; more of a spacial thing resulting from stroke" (08/13/2017)    Past Surgical History:  Procedure Laterality Date  . ABDOMINAL HERNIA REPAIR     "has a mesh in his abdomen"  . ANTERIOR CERVICAL DECOMP/DISCECTOMY FUSION  10/2005   C6-7  . ATRIAL FIBRILLATION ABLATION N/A 09/17/2017   Procedure: ATRIAL FIBRILLATION ABLATION;  Surgeon:  Constance Haw, MD;  Location: [place] [facility];  Service: Cardiovascular;  Laterality: N/A;  . BACK SURGERY    . CARDIAC CATHETERIZATION  2003  . CATARACT EXTRACTION W/ INTRAOCULAR LENS  IMPLANT, BILATERAL Bilateral   . CORONARY ANGIOPLASTY WITH STENT PLACEMENT  08/13/2017  . CORONARY STENT INTERVENTION N/A 08/13/2017   Procedure: CORONARY STENT INTERVENTION;  Surgeon: Burnell Blanks, MD;  Location: [place] [facility];  Service: Cardiovascular;  Laterality: N/A;  . EXCISIONAL HEMORRHOIDECTOMY    . HERNIA REPAIR    . LEFT HEART CATH AND CORONARY ANGIOGRAPHY N/A 08/13/2017   Procedure: LEFT HEART CATH AND CORONARY ANGIOGRAPHY;  Surgeon: Burnell Blanks, MD;  Location: [place] [facility];  Service: Cardiovascular;  Laterality: N/A;  . LEFT HEART CATH AND CORONARY ANGIOGRAPHY N/A 06/20/2019   Procedure: LEFT HEART CATH AND CORONARY ANGIOGRAPHY;  Surgeon: Nelva Bush, MD;  Location: [place] [facility];  Service: Cardiovascular;  Laterality: N/A;  . SHOULDER ARTHROSCOPY WITH ROTATOR CUFF REPAIR Right 10/30/2014   Procedure: SHOULDER ARTHROSCOPY WITH ROTATOR CUFF REPAIR;  Surgeon: Tania Ade, MD;  Location: [place];  Service: Orthopedics;  Laterality: Right;  . SHOULDER ARTHROSCOPY WITH SUBACROMIAL DECOMPRESSION Right 10/30/2014   Procedure: Right shoulder arthroscopy rotator cuff repair, subacromial decompression;  Surgeon: Tania Ade, MD;  Location: [place];  Service: Orthopedics;  Laterality: Right;  Right shoulder arthrscopy rotator cuff repair, subacromial decompression    Current Medications: Current Meds  Medication Sig  . atorvastatin (LIPITOR) 80 MG  tablet TAKE 1 TABLET BY MOUTH  DAILY  . cyclobenzaprine (FLEXERIL) 10 MG tablet Take 1 tablet (10 mg total) by mouth 3 (three) times daily as needed for muscle spasms.  . metoprolol tartrate (LOPRESSOR) 25 MG tablet Take 1 tablet (25 mg total) by mouth 4 (four) times daily as  needed.  . nitroGLYCERIN (NITROSTAT) 0.4 MG SL tablet Place 0.4 mg under the tongue every 5 (five) minutes x 3 doses as needed for chest pain.  Marland Kitchen XARELTO 20 MG TABS tablet TAKE 1 TABLET BY MOUTH  DAILY WITH SUPPER     Allergies:   Feldene [piroxicam]   Social History   Socioeconomic History  . Marital status: Married    Spouse name: Not on file  . Number of children: Not on file  . Years of education: Not on file  . Highest education level: Not on file  Occupational History  . Not on file  Tobacco Use  . Smoking status: Former Smoker    Years: 27.00    Types: Cigarettes    Quit date: 03/25/1999    Years since quitting: 21.3  . Smokeless tobacco: Never Used  Vaping Use  . Vaping Use: Never used  Substance and Sexual Activity  . Alcohol use: No  . Drug use: No  . Sexual activity: Not Currently  Other Topics Concern  . Not on file  Social History Narrative  . Not on file   Social Determinants of Health   Financial Resource Strain: Not on file  Food Insecurity: Not on file  Transportation Needs: Not on file  Physical Activity: Not on file  Stress: Not on file  Social Connections: Not on file     Family History: The patient's family history includes Alcohol abuse in his father and mother; Arrhythmia in his mother; Colon cancer in his father; Stroke in his maternal grandfather.  ROS:   Please see the history of present illness.     All other systems reviewed and are negative.  EKGs/Labs/Other Studies Reviewed:    The following studies were reviewed today: As above  EKG:  EKG is  ordered today.  The ekg ordered today demonstrates sinus rhythm 71 no other abnormalities  Recent Labs: 07/25/2020: ALT 18; BUN 14; Creat 0.79; Hemoglobin 15.1; Platelets 281; Potassium 4.4; Sodium 140  Recent Lipid Panel    Component Value Date/Time   CHOL 97 (L) 06/16/2019 1121   TRIG 79 06/16/2019 1121   HDL 45 06/16/2019 1121   CHOLHDL 2.2 06/16/2019 1121   CHOLHDL 2.3 11/27/2017  1127   VLDL 8 11/02/2015 0804   LDLCALC 36 06/16/2019 1121   LDLCALC 44 11/27/2017 1127     Risk Assessment/Calculations:      Physical Exam:    VS:  BP 116/70 (BP Location: Left Arm, Patient Position: Sitting, Cuff Size: Normal)   Pulse 71   Ht 5\' 10"  (1.778 m)   Wt 225 lb (102.1 kg)   SpO2 95%   BMI 32.28 kg/m     Wt Readings from Last 3 Encounters:  08/07/20 225 lb (102.1 kg)  07/25/20 222 lb (100.7 kg)  12/28/19 218 lb (98.9 kg)     GEN:  Well nourished, well developed in no acute distress HEENT: Normal NECK: No JVD; No carotid bruits LYMPHATICS: No lymphadenopathy CARDIAC: RRR, no murmurs, rubs, gallops RESPIRATORY:  Clear to auscultation without rales, wheezing or rhonchi  ABDOMEN: Soft, non-tender, non-distended MUSCULOSKELETAL:  No edema; No deformity  SKIN: Warm and dry NEUROLOGIC:  Alert  and oriented x 3 PSYCHIATRIC:  Normal affect   ASSESSMENT:    1. Coronary artery disease involving native coronary artery of native heart without angina pectoris   2. Paroxysmal atrial fibrillation (HCC)   3. S/P ablation of atrial fibrillation    PLAN:    In order of problems listed above:  Coronary artery disease - Previously placed circumflex stent is widely patent from heart catheterization recently.  No anginal symptoms.  His symptoms that prompted cardiac catheterization were likely GERD related.  Feels better with Protonix.  Continue with current medical medication management. -Previously we had stopped metoprolol because of hypotension.  Paroxysmal atrial fibrillation -May have had another episode of atrial fibrillation that lasted a day or 2.  His heart rate was consistently 1 12-1 14 on his Fitbit.  This occurred after laying mulch 1 day.  He also had it while he was in [place]. - I will give him metoprolol tartrate 25 mg 4 times a day as needed to have if his heart rate is elevated.  Hydrate well. - Dr. Curt Bears performed ablation in the past.   CHA2DS2-VASc score is 4. -On Xarelto doing well without any bleeding issues.  Hemoglobin 15.1, creatinine 0.79 potassium 4.4.  Obstructive sleep apnea - Continue with CPAP.  Prior stroke 2011 - Expressed the importance of anticoagulation.  Hyperlipidemia - Continue with atorvastatin 80 mg high intensity medication management.  ALT 18 LDL 36.      Medication Adjustments/Labs and Tests Ordered: Current medicines are reviewed at length with the patient today.  Concerns regarding medicines are outlined above.  Orders Placed This Encounter  Procedures  . EKG 12-Lead   Meds ordered this encounter  Medications  . metoprolol tartrate (LOPRESSOR) 25 MG tablet    Sig: Take 1 tablet (25 mg total) by mouth 4 (four) times daily as needed.    Dispense:  30 tablet    Refill:  3    Patient Instructions  Medication Instructions:  You may take Metoprolol Tartrate 25 mg up to 4 times a day as needed for a rapid heart rate. Continue all other medications as listed.  *If you need a refill on your cardiac medications before your next appointment, please call your pharmacy*  Follow-Up: At [facility], you and your health needs are our priority.  As part of our continuing mission to provide you with exceptional heart care, we have created designated Provider Care Teams.  These Care Teams include your primary Cardiologist (physician) and Advanced Practice Providers (APPs -  Physician Assistants and Nurse Practitioners) who all work together to provide you with the care you need, when you need it.  We recommend signing up for the patient portal called "MyChart".  Sign up information is provided on this After Visit Summary.  MyChart is used to connect with patients for Virtual Visits (Telemedicine).  Patients are able to view lab/test results, encounter notes, upcoming appointments, etc.  Non-urgent messages can be sent to your provider as well.   To learn more about what you can do with MyChart, go  to [email or web address].    Your next appointment:   6 month(s)  The format for your next appointment:   In Person  Provider:   Candee Furbish, MD   Thank you for choosing [facility]!!         Signed, Candee Furbish, MD  08/07/2020 4:12 PM    [place]

## 2020-08-07 NOTE — Patient Instructions (Signed)
Medication Instructions:  You may take Metoprolol Tartrate 25 mg up to 4 times a day as needed for a rapid heart rate. Continue all other medications as listed.  *If you need a refill on your cardiac medications before your next appointment, please call your pharmacy*  Follow-Up: At [facility], you and your health needs are our priority.  As part of our continuing mission to provide you with exceptional heart care, we have created designated Provider Care Teams.  These Care Teams include your primary Cardiologist (physician) and Advanced Practice Providers (APPs -  Physician Assistants and Nurse Practitioners) who all work together to provide you with the care you need, when you need it.  We recommend signing up for the patient portal called "MyChart".  Sign up information is provided on this After Visit Summary.  MyChart is used to connect with patients for Virtual Visits (Telemedicine).  Patients are able to view lab/test results, encounter notes, upcoming appointments, etc.  Non-urgent messages can be sent to your provider as well.   To learn more about what you can do with MyChart, go to [email or web address].    Your next appointment:   6 month(s)  The format for your next appointment:   In Person  Provider:   Candee Furbish, MD   Thank you for choosing [facility]!!

## 2020-08-22 ENCOUNTER — Other Ambulatory Visit: Payer: Self-pay

## 2020-08-22 ENCOUNTER — Ambulatory Visit (INDEPENDENT_AMBULATORY_CARE_PROVIDER_SITE_OTHER): Payer: Medicare Other | Admitting: Sports Medicine

## 2020-08-22 DIAGNOSIS — M7712 Lateral epicondylitis, left elbow: Secondary | ICD-10-CM | POA: Diagnosis not present

## 2020-08-22 DIAGNOSIS — Q761 Klippel-Feil syndrome: Secondary | ICD-10-CM | POA: Diagnosis not present

## 2020-08-22 MED ORDER — PREDNISONE 10 MG (48) PO TBPK: ORAL_TABLET | Freq: Every day | ORAL | 0 refills | Status: DC

## 2020-08-22 NOTE — Assessment & Plan Note (Signed)
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 Adding a tennis elbow brace, formal physical therapy, return in a month, injection if no better.

## 2020-08-22 NOTE — Progress Notes (Signed)
    Procedures performed today:    None.  Independent interpretation of notes and tests performed by another provider:   Cervical spine x-rays personally reviewed, stable appearing C5-C7 ACDF with adjacent level disease at the C4-C5 and C7-T1 levels.  Brief History, Exam, Impression, and Recommendations:    Cervical fusion syndrome Stephen King is a pleasant 70 year old male with a history of a C5-C7 ACDF, he has C4-C5 and C7-T1 DDD/adjacent level disease on x-rays. He has been having pain in the neck with radiation into the left periscapular region with numbness and tingling down the arm to the fingertips, predominantly a C8 distribution. He did well with prednisone, we are going to piggyback off of this with a 12-day taper, and he will most certainly need some aggressive physical therapy. 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 He can continue his gabapentin, he can also continue tramadol, he tells me he tried some of this and it did not make him sick. We have to avoid NSAIDs due to Xarelto treatment.  Lateral epicondylitis, left elbow In addition to the above he has pain at the lateral elbow, worse when using levers on the TRAM while at work, tenderness discretely at the common extensor tendon origin. Adding a tennis elbow brace, formal physical therapy, return in a month, injection if no better.    ___________________________________________ Gwen Her. Dianah Field, M.D., ABFM., CAQSM. Primary Care and [place] Instructor of [place] [employer] of Medicine

## 2020-08-22 NOTE — Assessment & Plan Note (Signed)
Stephen King is a pleasant 70 year old male with a history of a C5-C7 ACDF, he has C4-C5 and C7-T1 DDD/adjacent level disease on x-rays. He has been having pain in the neck with radiation into the left periscapular region with numbness and tingling down the arm to the fingertips, predominantly a C8 distribution. He did well with prednisone, we are going to piggyback off of this with a 12-day taper, and he will most certainly need some aggressive physical therapy. 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 He can continue his gabapentin, he can also continue tramadol, he tells me he tried some of this and it did not make him sick. We have to avoid NSAIDs due to Xarelto treatment.

## 2020-08-27 DIAGNOSIS — M6281 Muscle weakness (generalized): Secondary | ICD-10-CM | POA: Diagnosis not present

## 2020-08-27 DIAGNOSIS — M542 Cervicalgia: Secondary | ICD-10-CM | POA: Diagnosis not present

## 2020-08-27 DIAGNOSIS — M79602 Pain in left arm: Secondary | ICD-10-CM | POA: Diagnosis not present

## 2020-08-30 DIAGNOSIS — M79602 Pain in left arm: Secondary | ICD-10-CM | POA: Diagnosis not present

## 2020-08-30 DIAGNOSIS — M6281 Muscle weakness (generalized): Secondary | ICD-10-CM | POA: Diagnosis not present

## 2020-08-30 DIAGNOSIS — M542 Cervicalgia: Secondary | ICD-10-CM | POA: Diagnosis not present

## 2020-09-03 DIAGNOSIS — M542 Cervicalgia: Secondary | ICD-10-CM | POA: Diagnosis not present

## 2020-09-03 DIAGNOSIS — M79602 Pain in left arm: Secondary | ICD-10-CM | POA: Diagnosis not present

## 2020-09-03 DIAGNOSIS — M6281 Muscle weakness (generalized): Secondary | ICD-10-CM | POA: Diagnosis not present

## 2020-09-07 DIAGNOSIS — M6281 Muscle weakness (generalized): Secondary | ICD-10-CM | POA: Diagnosis not present

## 2020-09-07 DIAGNOSIS — M542 Cervicalgia: Secondary | ICD-10-CM | POA: Diagnosis not present

## 2020-09-07 DIAGNOSIS — M79602 Pain in left arm: Secondary | ICD-10-CM | POA: Diagnosis not present

## 2020-09-14 ENCOUNTER — Other Ambulatory Visit: Payer: Self-pay

## 2020-09-14 ENCOUNTER — Ambulatory Visit (INDEPENDENT_AMBULATORY_CARE_PROVIDER_SITE_OTHER): Payer: Medicare Other | Admitting: Sports Medicine

## 2020-09-14 DIAGNOSIS — Q761 Klippel-Feil syndrome: Secondary | ICD-10-CM

## 2020-09-14 DIAGNOSIS — M7712 Lateral epicondylitis, left elbow: Secondary | ICD-10-CM

## 2020-09-14 NOTE — Progress Notes (Signed)
    Procedures performed today:    None.  Independent interpretation of notes and tests performed by another provider:   None.  Brief History, Exam, Impression, and Recommendations:    Cervical fusion syndrome Stephen King is a pleasant 70 year old male, he has a C5-C7 ACDF with adjacent level disease cranial and caudal. He has predominantly left C8 distribution radiculitis, unfortunately he has not improved with conservative treatment including physical therapy, steroids. He will continue gabapentin and tramadol. At this point we are going to proceed with MRI after failure of greater than 6 weeks of conservative treatment. This will likely lead to a left-sided C7 and T1 interlaminar epidural, he would need to stop his Xarelto 2 days prior to the injection. Follow-up for MRI results.  Lateral epicondylitis, left elbow Fortunately resolved with tennis elbow brace and physical therapy. 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 Of note they are opening up [place] in 2 years.    ___________________________________________ Gwen Her. Dianah Field, M.D., ABFM., CAQSM. Primary Care and [place] Instructor of [place] of [facility]

## 2020-09-14 NOTE — Assessment & Plan Note (Signed)
Fortunately resolved with tennis elbow brace and physical therapy. 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 Of note they are opening up [place] in 2 years.

## 2020-09-14 NOTE — Assessment & Plan Note (Signed)
Stephen King is a pleasant 70 year old male, he has a C5-C7 ACDF with adjacent level disease cranial and caudal. He has predominantly left C8 distribution radiculitis, unfortunately he has not improved with conservative treatment including physical therapy, steroids. He will continue gabapentin and tramadol. At this point we are going to proceed with MRI after failure of greater than 6 weeks of conservative treatment. This will likely lead to a left-sided C7 and T1 interlaminar epidural, he would need to stop his Xarelto 2 days prior to the injection. Follow-up for MRI results.

## 2020-09-29 ENCOUNTER — Other Ambulatory Visit: Payer: Self-pay

## 2020-09-29 ENCOUNTER — Ambulatory Visit (INDEPENDENT_AMBULATORY_CARE_PROVIDER_SITE_OTHER): Payer: Medicare Other

## 2020-09-29 DIAGNOSIS — M542 Cervicalgia: Secondary | ICD-10-CM | POA: Diagnosis not present

## 2020-09-29 DIAGNOSIS — Q761 Klippel-Feil syndrome: Secondary | ICD-10-CM

## 2020-10-02 ENCOUNTER — Ambulatory Visit (INDEPENDENT_AMBULATORY_CARE_PROVIDER_SITE_OTHER): Payer: Medicare Other | Admitting: Physician Assistant

## 2020-10-02 ENCOUNTER — Other Ambulatory Visit: Payer: Self-pay

## 2020-10-02 VITALS — BP 124/72 | HR 64 | Ht 70.0 in | Wt 225.0 lb

## 2020-10-02 DIAGNOSIS — Z23 Encounter for immunization: Secondary | ICD-10-CM

## 2020-10-02 DIAGNOSIS — Z0000 Encounter for general adult medical examination without abnormal findings: Secondary | ICD-10-CM | POA: Diagnosis not present

## 2020-10-02 NOTE — Progress Notes (Signed)
MEDICARE ANNUAL WELLNESS VISIT  10/02/2020  Subjective:  Stephen King is a 70 y.o. male patient of Alden Hipp, Royetta Car, PA-C who had a [employer] Visit today. Stephen King is Working part time and lives with their spouse. he has 2 children. he reports that he is socially active and does interact with friends/family regularly. he is minimally physically active and enjoys gardening.  Patient Care Team: Lavada Mesi as PCP - General (Family Medicine) Jerline Pain, MD as PCP - Cardiology (Cardiology) Constance Haw, MD as PCP - Electrophysiology (Cardiology)  Advanced Directives 10/02/2020 06/20/2019 12/14/2017 10/27/2017 09/17/2017 08/13/2017 08/13/2017  Does Patient Have a Medical Advance Directive? Yes Yes Yes Yes Yes Yes Yes  Type of Advance Directive Living will Living will;Healthcare Power of Attorney Living will;Healthcare Power of Attorney Living will;Healthcare Power of [place];Living will [place];Living will [place];Living will  Does patient want to make changes to medical advance directive? No - Patient declined No - Patient declined No - Patient declined No - Patient declined No - Patient declined - No - Patient declined  Copy of [place] in Chart? - No - copy requested No - copy requested No - copy requested No - copy requested No - copy requested No - copy requested  Pre-existing out of facility DNR order (yellow form or pink MOST form) - - - - - - -    Hospital Utilization Over the Past 12 Months: # of hospitalizations or ER visits: 0 # of surgeries: 0  Review of Systems    Patient reports that his overall health is unchanged when compared to last year.  Review of Systems: History obtained from chart review and the patient  All other systems negative.  Pain Assessment Pain : 0-10 Pain Score: 5  Pain Type: Chronic pain Pain Location: Neck Pain Radiating Towards:  Shoulders Pain Descriptors / Indicators: Constant Pain Onset: More than a month ago Pain Frequency: Constant     Current Medications & Allergies (verified) Allergies as of 10/02/2020       Reactions   Feldene [piroxicam] Hives        Medication List        Accurate as of October 02, 2020  9:30 AM. If you have any questions, ask your nurse or doctor.          atorvastatin 80 MG tablet Commonly known as: LIPITOR TAKE 1 TABLET BY MOUTH  DAILY   cyclobenzaprine 10 MG tablet Commonly known as: FLEXERIL Take 1 tablet (10 mg total) by mouth 3 (three) times daily as needed for muscle spasms.   metoprolol tartrate 25 MG tablet Commonly known as: LOPRESSOR Take 1 tablet (25 mg total) by mouth 4 (four) times daily as needed.   nitroGLYCERIN 0.4 MG SL tablet Commonly known as: NITROSTAT Place 0.4 mg under the tongue every 5 (five) minutes x 3 doses as needed for chest pain.   Xarelto 20 MG Tabs tablet Generic drug: rivaroxaban TAKE 1 TABLET BY MOUTH  DAILY WITH SUPPER        History (reviewed): Past Medical History:  Diagnosis Date   Arthritis    "?right knee" (08/13/2017)   Dysrhythmia    PAF   Hernia, umbilical    History of hiatal hernia    History of kidney stones    Stroke [facility]) 12/2009   "has to watch where he's walking since; more of a spacial thing resulting from  stroke" (08/13/2017)   Past Surgical History:  Procedure Laterality Date   ABDOMINAL HERNIA REPAIR     "has a mesh in his abdomen"   ANTERIOR CERVICAL DECOMP/DISCECTOMY FUSION  10/2005   C6-7   ATRIAL FIBRILLATION ABLATION N/A 09/17/2017   Procedure: ATRIAL FIBRILLATION ABLATION;  Surgeon: Constance Haw, MD;  Location: [place] [facility];  Service: Cardiovascular;  Laterality: N/A;   BACK SURGERY     CARDIAC CATHETERIZATION  2003   CATARACT EXTRACTION W/ INTRAOCULAR LENS  IMPLANT, BILATERAL Bilateral    CORONARY ANGIOPLASTY WITH STENT PLACEMENT  08/13/2017   CORONARY STENT INTERVENTION  N/A 08/13/2017   Procedure: CORONARY STENT INTERVENTION;  Surgeon: Burnell Blanks, MD;  Location: [place] [facility];  Service: Cardiovascular;  Laterality: N/A;   EXCISIONAL HEMORRHOIDECTOMY     HERNIA REPAIR     LEFT HEART CATH AND CORONARY ANGIOGRAPHY N/A 08/13/2017   Procedure: LEFT HEART CATH AND CORONARY ANGIOGRAPHY;  Surgeon: Burnell Blanks, MD;  Location: [place] [facility];  Service: Cardiovascular;  Laterality: N/A;   LEFT HEART CATH AND CORONARY ANGIOGRAPHY N/A 06/20/2019   Procedure: LEFT HEART CATH AND CORONARY ANGIOGRAPHY;  Surgeon: Nelva Bush, MD;  Location: [place] [facility];  Service: Cardiovascular;  Laterality: N/A;   SHOULDER ARTHROSCOPY WITH ROTATOR CUFF REPAIR Right 10/30/2014   Procedure: SHOULDER ARTHROSCOPY WITH ROTATOR CUFF REPAIR;  Surgeon: Tania Ade, MD;  Location: [place];  Service: Orthopedics;  Laterality: Right;   SHOULDER ARTHROSCOPY WITH SUBACROMIAL DECOMPRESSION Right 10/30/2014   Procedure: Right shoulder arthroscopy rotator cuff repair, subacromial decompression;  Surgeon: Tania Ade, MD;  Location: [place];  Service: Orthopedics;  Laterality: Right;  Right shoulder arthrscopy rotator cuff repair, subacromial decompression   Family History  Problem Relation Age of Onset   Alcohol abuse Mother    Arrhythmia Mother    Colon cancer Father    Alcohol abuse Father    Stroke Maternal Grandfather    Social History   Socioeconomic History   Marital status: Married    Spouse name: Stephen King   Number of children: 2   Years of education: 13   Highest education level: Some college, no degree  Occupational History    Comment: Retired-working part-time  Tobacco Use   Smoking status: Former    Years: 27.00    Pack years: 0.00    Types: Cigarettes    Quit date: 03/25/1999    Years since quitting: 21.5   Smokeless tobacco: Never  Vaping Use   Vaping Use: Never used  Substance and Sexual Activity    Alcohol use: No   Drug use: No   Sexual activity: Not Currently  Other Topics Concern   Not on file  Social History Narrative   Lives with wife. His daughter and granddaughter are living with him temporarily.   Social Determinants of Health   Financial Resource Strain: Low Risk    Difficulty of Paying Living Expenses: Not hard at all  Food Insecurity: No Food Insecurity   Worried About Charity fundraiser in the Last Year: Never true   [place] in the Last Year: Never true  Transportation Needs: No Transportation Needs   Lack of Transportation (Medical): No   Lack of Transportation (Non-Medical): No  Physical Activity: Insufficiently Active   Days of Exercise per Week: 3 days   Minutes of Exercise per Session: 30 min  Stress: Stress Concern Present   Feeling of Stress : To  some extent  Social Connections: Moderately Integrated   Frequency of Communication with Friends and Family: Once a week   Frequency of Social Gatherings with Friends and Family: More than three times a week   Attends Religious Services: More than 4 times per year   Active Member of [employer] or Organizations: No   Attends Archivist Meetings: Never   Marital Status: Married    Activities of Daily Living In your present state of health, do you have any difficulty performing the following activities: 10/02/2020  Hearing? Y  Comment has went to see an audiologist. He is having difficulty hearing in both ears.  Vision? N  Difficulty concentrating or making decisions? Y  Comment sometimes, he has noticed some remembering stuff.  Walking or climbing stairs? N  Dressing or bathing? N  Doing errands, shopping? N  Preparing Food and eating ? N  Using the Toilet? N  In the past six months, have you accidently leaked urine? N  Do you have problems with loss of bowel control? N  Managing your Medications? N  Managing your Finances? N  Housekeeping or managing your Housekeeping? N  Some recent  data might be hidden    Patient Education/Literacy How often do you need to have someone help you when you read instructions, pamphlets, or other written materials from your doctor or pharmacy?: 1 - Never What is the last grade level you completed in school?: 12th grade and some college  Exercise Current Exercise Habits: The patient has a physically strenuous job, but has no regular exercise apart from work., Exercise limited by: None identified  Diet Patient reports consuming 3 meals a day and 0 snack(s) a day Patient reports that his primary diet is: Regular Patient reports that she does have regular access to food.   Depression Screen PHQ 2/9 Scores 10/02/2020 12/28/2019 01/19/2019 11/27/2017 07/28/2017  PHQ - 2 Score 0 2 0 1 2  PHQ- 9 Score - 5 2 - 9     Fall Risk Fall Risk  10/02/2020 12/28/2019 12/28/2019 11/27/2017 11/27/2017  Falls in the past year? 0 0 0 No No  Number falls in past yr: 0 0 0 - -  Injury with Fall? 0 0 0 - -  Risk for fall due to : No Fall Risks - - - -  Follow up Falls evaluation completed Falls evaluation completed Falls evaluation completed - -     Objective:   BP 124/72 (BP Location: Right Arm, Patient Position: Sitting, Cuff Size: Normal)   Pulse 64   Ht 5\' 10"  (1.778 m)   Wt 225 lb 0.6 oz (102.1 kg)   SpO2 95%   BMI 32.29 kg/m   Last Weight  Most recent update: 10/02/2020  9:28 AM    Weight  102.1 kg (225 lb 0.6 oz)             Body mass index is 32.29 kg/m.  Hearing/Vision  Stephen King did not have difficulty with hearing/understanding during the face-to-face interview Stephen King did not have difficulty with his vision during the face-to-face interview Reports that he has not had a formal eye exam by an eye care professional within the past year Reports that he has had a formal hearing evaluation within the past year  Cognitive Function: 6CIT Screen 10/02/2020 11/27/2017  What Year? 0 points 0 points  What month? 0 points 0 points  What time? 0 points 0  points  Count back from 20 0 points 0 points  Months  in reverse 0 points 0 points  Repeat phrase 0 points 0 points  Total Score 0 0    Normal Cognitive Function Screening: Yes (Normal:0-7, Significant for Dysfunction: >8)  Immunization & Health Maintenance Record Immunization History  Administered Date(s) Administered   Fluad Quad(high Dose 65+) 01/19/2019   Influenza Split 02/04/2012   Influenza,inj,Quad PF,6+ Mos 03/07/2016, 11/17/2017   Influenza-Unspecified 03/06/2017   PFIZER(Purple Top)SARS-COV-2 Vaccination 04/14/2019, 05/06/2019, 12/23/2019   Pneumococcal Polysaccharide-23 11/27/2017   Tdap 03/26/2009, 06/30/2019    Health Maintenance  Topic Date Due   COVID-19 Vaccine (4 - Booster for [place] series) 10/18/2020 (Originally 03/24/2020)   Zoster Vaccines- Shingrix (1 of 2) 01/02/2021 (Originally 07/08/1969)   PNA vac Low Risk Adult (2 of 2 - PCV13) 07/25/2021 (Originally 11/28/2018)   INFLUENZA VACCINE  10/22/2020   COLONOSCOPY (Pts 45-65yrs Insurance coverage will need to be confirmed)  04/29/2021   TETANUS/TDAP  06/29/2029   Hepatitis C Screening  Completed   HPV VACCINES  Aged Out       Assessment  This is a routine wellness examination for Stephen King.  Health Maintenance: Due or Overdue There are no preventive care reminders to display for this patient.   Stephen King does not need a referral for Community Assistance: Care Management:   no Social Work:    no Prescription Assistance:  no Nutrition/Diabetes Education:  no   Plan:  Personalized Goals  Goals Addressed               This Visit's Progress     Patient Stated (pt-stated)        Wants to loose 20 lbs over the next year.        Personalized Health Maintenance & Screening Recommendations  Pneumococcal vaccine  Shingrix   Lung Cancer Screening Recommended: no (Low Dose CT Chest recommended if Age 78-80 years, 30 pack-year currently smoking OR have quit w/in past 15 years) Hepatitis  C Screening recommended: no HIV Screening recommended: no  Advanced Directives: Written information was not given per the patient's request.  Referrals & Orders Orders Placed This Encounter  Procedures   Pneumococcal conjugate vaccine 20-valent (Prevnar 20)     Follow-up Plan Follow-up with Donella Stade, PA-C as planned Schedule your shingles vaccine at your pharmacy. Medicare wellness visit in one year.  AVS printed and given to the patient.   I have personally reviewed and noted the following in the patient's chart:   Medical and social history Use of alcohol, tobacco or illicit drugs  Current medications and supplements Functional ability and status Nutritional status Physical activity Advanced directives List of other physicians Hospitalizations, surgeries, and ER visits in previous 12 months Vitals Screenings to include cognitive, depression, and falls Referrals and appointments  In addition, I have reviewed and discussed with patient certain preventive protocols, quality metrics, and best practice recommendations. A written personalized care plan for preventive services as well as general preventive health recommendations were provided to patient.     Tinnie Gens, RN  10/02/2020

## 2020-10-02 NOTE — Patient Instructions (Addendum)
[place] Maintenance Summary and Written Plan of Care  Stephen King ,  Thank you for allowing me to perform your Medicare Annual Wellness Visit and for your ongoing commitment to your health.   Health Maintenance & Immunization History Health Maintenance  Topic Date Due   COVID-19 Vaccine (4 - Booster for Pfizer series) 10/18/2020 (Originally 03/24/2020)   Zoster Vaccines- Shingrix (1 of 2) 01/02/2021 (Originally 07/08/1969)   INFLUENZA VACCINE  10/22/2020   COLONOSCOPY (Pts 45-73yrs Insurance coverage will need to be confirmed)  04/29/2021   PNA vac Low Risk Adult (2 of 2 - PCV13) 10/02/2021   TETANUS/TDAP  06/29/2029   Hepatitis C Screening  Completed   HPV VACCINES  Aged Out   Immunization History  Administered Date(s) Administered   Fluad Quad(high Dose 65+) 01/19/2019   Influenza Split 02/04/2012   Influenza,inj,Quad PF,6+ Mos 03/07/2016, 11/17/2017   Influenza-Unspecified 03/06/2017   PFIZER(Purple Top)SARS-COV-2 Vaccination 04/14/2019, 05/06/2019, 12/23/2019   PNEUMOCOCCAL CONJUGATE-20 10/02/2020   Pneumococcal Polysaccharide-23 11/27/2017   Tdap 03/26/2009, 06/30/2019    These are the patient goals that we discussed:  Goals Addressed               This Visit's Progress     Patient Stated (pt-stated)        Wants to loose 20 lbs over the next year.          This is a list of Health Maintenance Items that are overdue or due now: Pneumococcal vaccine  Shingrix   Orders/Referrals Placed Today: Orders Placed This Encounter  Procedures   Pneumococcal conjugate vaccine 20-valent (Prevnar 20)   (Contact our referral department at [phone] if you have not spoken with someone about your referral appointment within the next 5 days)    Follow-up Plan Follow-up with Donella Stade, PA-C as planned Schedule your shingles vaccine at your pharmacy. Medicare wellness visit in one year.  AVS printed and given to the  patient.        Health Maintenance, Male Adopting a healthy lifestyle and getting preventive care are important in promoting health and wellness. Ask your health care provider about: The right schedule for you to have regular tests and exams. Things you can do on your own to prevent diseases and keep yourself healthy. What should I know about diet, weight, and exercise? Eat a healthy diet  Eat a diet that includes plenty of vegetables, fruits, low-fat dairy products, and lean protein. Do not eat a lot of foods that are high in solid fats, added sugars, or sodium.  Maintain a healthy weight Body mass index (BMI) is a measurement that can be used to identify possible weight problems. It estimates body fat based on height and weight. Your health care provider can help determine your BMI and help you achieve or maintain ahealthy weight. Get regular exercise Get regular exercise. This is one of the most important things you can do for your health. Most adults should: Exercise for at least 150 minutes each week. The exercise should increase your heart rate and make you sweat (moderate-intensity exercise). Do strengthening exercises at least twice a week. This is in addition to the moderate-intensity exercise. Spend less time sitting. Even light physical activity can be beneficial. Watch cholesterol and blood lipids Have your blood tested for lipids and cholesterol at 69 years of age, then havethis test every 5 years. You may need to have your cholesterol levels checked more often if: Your lipid or cholesterol levels  are high. You are older than 69 years of age. You are at high risk for heart disease. What should I know about cancer screening? Many types of cancers can be detected early and may often be prevented. Depending on your health history and family history, you may need to have cancer screening at various ages. This may include screening for: Colorectal cancer. Prostate  cancer. Skin cancer. Lung cancer. What should I know about heart disease, diabetes, and high blood pressure? Blood pressure and heart disease High blood pressure causes heart disease and increases the risk of stroke. This is more likely to develop in people who have high blood pressure readings, are of African descent, or are overweight. Talk with your health care provider about your target blood pressure readings. Have your blood pressure checked: Every 3-5 years if you are 61-37 years of age. Every year if you are 81 years old or older. If you are between the ages of 17 and 44 and are a current or former smoker, ask your health care provider if you should have a one-time screening for abdominal aortic aneurysm (AAA). Diabetes Have regular diabetes screenings. This checks your fasting blood sugar level. Have the screening done: Once every three years after age 76 if you are at a normal weight and have a low risk for diabetes. More often and at a younger age if you are overweight or have a high risk for diabetes. What should I know about preventing infection? Hepatitis B If you have a higher risk for hepatitis B, you should be screened for this virus. Talk with your health care provider to find out if you are at risk forhepatitis B infection. Hepatitis C Blood testing is recommended for: Everyone born from [birth year] through 1964. Anyone with known risk factors for hepatitis C. Sexually transmitted infections (STIs) You should be screened each year for STIs, including gonorrhea and chlamydia, if: You are sexually active and are younger than 69 years of age. You are older than 69 years of age and your health care provider tells you that you are at risk for this type of infection. Your sexual activity has changed since you were last screened, and you are at increased risk for chlamydia or gonorrhea. Ask your health care provider if you are at risk. Ask your health care provider about whether you  are at high risk for HIV. Your health care provider may recommend a prescription medicine to help prevent HIV infection. If you choose to take medicine to prevent HIV, you should first get tested for HIV. You should then be tested every 3 months for as long as you are taking the medicine. Follow these instructions at home: Lifestyle Do not use any products that contain nicotine or tobacco, such as cigarettes, e-cigarettes, and chewing tobacco. If you need help quitting, ask your health care provider. Do not use street drugs. Do not share needles. Ask your health care provider for help if you need support or information about quitting drugs. Alcohol use Do not drink alcohol if your health care provider tells you not to drink. If you drink alcohol: Limit how much you have to 0-2 drinks a day. Be aware of how much alcohol is in your drink. In the [place] one drink equals one 12 oz bottle of beer (355 mL), one 5 oz glass of wine (148 mL), or one 1 oz glass of hard liquor (44 mL). General instructions Schedule regular health, dental, and eye exams. Stay current with your vaccines. Tell  your health care provider if: You often feel depressed. You have ever been abused or do not feel safe at home. Summary Adopting a healthy lifestyle and getting preventive care are important in promoting health and wellness. Follow your health care provider's instructions about healthy diet, exercising, and getting tested or screened for diseases. Follow your health care provider's instructions on monitoring your cholesterol and blood pressure. This information is not intended to replace advice given to you by your health care provider. Make sure you discuss any questions you have with your healthcare provider. Document Revised: 03/03/2018 Document Reviewed: 03/03/2018 Elsevier Patient Education  2022 [place].  Hearing Loss Hearing loss is a partial or total loss of the ability to hear. This can betemporary  or permanent, and it can happen in one or both ears. Medical care is necessary to treat hearing loss properly and to prevent the condition from getting worse. Your hearing may partially or completely come back, depending on what caused your hearing loss and how severe it is. In somecases, hearing loss is permanent. What are the causes? Common causes of hearing loss include: Too much wax in the ear canal. Infection of the ear canal or middle ear. Fluid in the middle ear. Injury to the ear or surrounding area. An object stuck in the ear. A history of prolonged exposure to loud sounds, such as music. Less common causes of hearing loss include: Tumors in the ear. Viral or bacterial infections, such as meningitis. A hole in the eardrum (perforated eardrum). Problems with the hearing nerve that sends signals between the brain and the ear. Certain medicines. What are the signs or symptoms? Symptoms of this condition may include: Difficulty telling the difference between sounds. Difficulty following a conversation when there is background noise. Lack of response to sounds in your environment. This may be most noticeable when you do not respond to startling sounds. Needing to turn up the volume on the television, radio, or other devices. Ringing in the ears. Dizziness. How is this diagnosed? This condition is diagnosed based on: A physical exam. A hearing test (audiometry). The audiometry test will be performed by a hearing specialist (audiologist). You may also be referred to an ear, nose, and throat (ENT) specialist (otolaryngologist). How is this treated? Treatment for hearing loss may include: Ear wax removal. Medicines to treat or prevent infection (antibiotics). Medicines to reduce inflammation (corticosteroids). Hearing aids for hearing loss related to nerve damage. Follow these instructions at home: If you were prescribed an antibiotic medicine, take it as told by your health care  provider. Do not stop taking the antibiotic even if you start to feel better. Take over-the-counter and prescription medicines only as told by your health care provider. Avoid loud noises. Return to your normal activities as told by your health care provider. Ask your health care provider what activities are safe for you. Keep all follow-up visits as told by your health care provider. This is important. Contact a health care provider if: You feel dizzy. You develop new symptoms. You vomit or feel nauseous. You have a fever. Get help right away if: You develop sudden changes in your vision. You have severe ear pain. You have new or increased weakness. You have a severe headache. Summary Hearing loss is a decreased ability to hear sounds around you. It can be temporary or permanent. Treatment will depend on the cause of your hearing loss. It may include ear wax removal, medicines, or a hearing aid. Your hearing may partially  or completely come back, depending on what caused your hearing loss and how severe it is. Keep all follow-up visits as told by your health care provider. This is important. This information is not intended to replace advice given to you by your health care provider. Make sure you discuss any questions you have with your healthcare provider. Document Revised: 12/08/2017 Document Reviewed: 12/08/2017 Elsevier Patient Education  [place].

## 2020-10-03 ENCOUNTER — Ambulatory Visit: Payer: Medicare Other

## 2020-10-04 ENCOUNTER — Encounter (INDEPENDENT_AMBULATORY_CARE_PROVIDER_SITE_OTHER): Payer: Medicare Other

## 2020-10-04 DIAGNOSIS — Q761 Klippel-Feil syndrome: Secondary | ICD-10-CM | POA: Diagnosis not present

## 2020-10-05 NOTE — Telephone Encounter (Signed)
I spent 5 total minutes of online digital evaluation and management services. 

## 2020-10-05 NOTE — Assessment & Plan Note (Signed)
See prior notes, epidural ordered, follow up 4 weeks.

## 2020-10-08 ENCOUNTER — Other Ambulatory Visit: Payer: Self-pay | Admitting: Cardiology

## 2020-10-08 ENCOUNTER — Telehealth: Payer: Self-pay | Admitting: *Deleted

## 2020-10-08 NOTE — Telephone Encounter (Signed)
Patient with diagnosis of afib on Xarelto for anticoagulation.    Procedure: cervical ESI Date of procedure: TBD  CHA2DS2-VASc Score = 4  This indicates a 4.8% annual risk of stroke. The patient's score is based upon: CHF History: No HTN History: No Diabetes History: No Stroke History: Yes Vascular Disease History: Yes Age Score: 1 Gender Score: 0   CrCl >13mL/min Platelet count 281K  Typically hold DOAC therapy for 3 days prior to spinal ESI. Request is for 2 day hold. Pt is at elevated risk off of anticoag given hx of afib and prior stroke. Will forward to MD for input regarding anticoag length of hold.

## 2020-10-08 NOTE — Telephone Encounter (Signed)
   [facility] Pre-operative Risk Assessment    Patient Name: Stephen King  DOB: [birth year] MRN: [id number]  HEARTCARE STAFF:  - IMPORTANT!!!!!! Under Visit Info/Reason for Call, type in Other and utilize the format Clearance MM/DD/YY or Clearance TBD. Do not use dashes or single digits. - Please review there is not already an duplicate clearance open for this procedure. - If request is for dental extraction, please clarify the # of teeth to be extracted. - If the patient is currently at the dentist's office, call Pre-Op Callback Staff (MA/nurse) to input urgent request.  - If the patient is not currently in the dentist office, please route to the Pre-Op pool.  Request for surgical clearance:  What type of surgery is being performed? CERVICAL EPIDURAL INJECTION  When is this surgery scheduled? TBD  What type of clearance is required (medical clearance vs. Pharmacy clearance to hold med vs. Both)? BOTH  Are there any medications that need to be held prior to surgery and how long?  XARELTO x 2 DAYS PRIOR  Practice name and name of physician performing surgery? [facility]  What is the office phone number? [phone]   7.   What is the office fax number? [phone] ATTN: CATHY C.  8.   Anesthesia type (None, local, MAC, general) ? LOCAL   Julaine Hua 10/08/2020, 2:01 PM  _________________________________________________________________   (provider comments below)

## 2020-10-09 NOTE — Telephone Encounter (Signed)
   Primary Cardiologist: Candee Furbish, MD  Chart reviewed as part of pre-operative protocol coverage. Given past medical history and time since last visit, based on ACC/AHA guidelines, BRADON FESTER would be at acceptable risk for the planned procedure without further cardiovascular testing.   Patient with diagnosis of afib on Xarelto for anticoagulation.     Procedure: cervical ESI Date of procedure: TBD   CHA2DS2-VASc Score = 4  This indicates a 4.8% annual risk of stroke. The patient's score is based upon: CHF History: No HTN History: No Diabetes History: No Stroke History: Yes Vascular Disease History: Yes Age Score: 1 Gender Score: 0    CrCl >163mL/min Platelet count 281K   He may hold his DOAC therapy for 3 days prior to spinal ESI.  Pt is at elevated risk off of anticoag given hx of afib and prior stroke.  Please resume Xarelto as soon as hemostasis is achieved.  I will route this recommendation to the requesting party via Epic fax function and remove from pre-op pool.  Please call with questions.  Jossie Ng. Edson Deridder NP-C    10/09/2020, 6:47 AM [address] Office [phone] Fax [phone]

## 2020-10-18 ENCOUNTER — Ambulatory Visit: Payer: Medicare Other | Admitting: Sports Medicine

## 2020-10-22 ENCOUNTER — Ambulatory Visit
Admission: RE | Admit: 2020-10-22 | Discharge: 2020-10-22 | Disposition: A | Discharge status: Home / Self Care | Payer: Medicare Other | Source: Ambulatory Visit | Attending: Sports Medicine | Admitting: Sports Medicine

## 2020-10-22 DIAGNOSIS — Q761 Klippel-Feil syndrome: Secondary | ICD-10-CM

## 2020-10-22 DIAGNOSIS — M5412 Radiculopathy, cervical region: Secondary | ICD-10-CM | POA: Diagnosis not present

## 2020-10-22 MED ORDER — TRIAMCINOLONE ACETONIDE 40 MG/ML IJ SUSP (RADIOLOGY)
60.0000 mg | Freq: Once | INTRAMUSCULAR | Status: AC
  Administered 2020-10-22: 60 mg via EPIDURAL

## 2020-10-22 MED ORDER — IOPAMIDOL (ISOVUE-M 300) INJECTION 61%
1.0000 mL | Freq: Once | INTRAMUSCULAR | Status: AC
  Administered 2020-10-22: 1 mL via EPIDURAL

## 2020-10-22 NOTE — Discharge Instructions (Signed)
Post Procedure Spinal Discharge Instruction Sheet  You may resume a regular diet and any medications that you routinely take (including pain medications) unless otherwise noted by MD.  No driving day of procedure.  Light activity throughout the rest of the day.  Do not do any strenuous work, exercise, bending or lifting.  The day following the procedure, you can resume normal physical activity but you should refrain from exercising or physical therapy for at least three days thereafter.  You may apply ice to the injection site, 20 minutes on, 20 minutes off, as needed. Do not apply ice directly to skin.    Common Side Effects:  Headaches- take your usual medications as directed by your physician.  Increase your fluid intake.  Caffeinated beverages may be helpful.  Lie flat in bed until your headache resolves.  Restlessness or inability to sleep- you may have trouble sleeping for the next few days.  Ask your referring physician if you need any medication for sleep.  Facial flushing or redness- should subside within a few days.  Increased pain- a temporary increase in pain a day or two following your procedure is not unusual.  Take your pain medication as prescribed by your referring physician.  Leg cramps  Please contact our office at [phone] for the following symptoms: Fever greater than 100 degrees. Headaches unresolved with medication after 2-3 days. Increased swelling, pain, or redness at injection site.   Thank you for visiting [facility] today.    YOU MAY RESUME YOUR XARELTO 24 HOURS AFTER INJECTION.

## 2020-11-28 ENCOUNTER — Ambulatory Visit (INDEPENDENT_AMBULATORY_CARE_PROVIDER_SITE_OTHER): Payer: Medicare Other | Admitting: Sports Medicine

## 2020-11-28 ENCOUNTER — Telehealth: Payer: Self-pay | Admitting: Lab

## 2020-11-28 DIAGNOSIS — Q761 Klippel-Feil syndrome: Secondary | ICD-10-CM | POA: Diagnosis not present

## 2020-11-28 NOTE — Progress Notes (Signed)
  Chronic Care Management   Outreach Note  11/28/2020 Name: Stephen King MRN: [id number] DOB: 02-Feb-1951  Referred by: Donella Stade, PA-C Reason for referral : Medication Management   An unsuccessful telephone outreach was attempted today. The patient was referred to the pharmacist for assistance with care management and care coordination.   Follow Up Plan:   [place]

## 2020-11-28 NOTE — Progress Notes (Signed)
    Procedures performed today:    None.  Independent interpretation of notes and tests performed by another provider:   None.  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    ___________________________________________ Gwen Her. Dianah Field, M.D., ABFM., CAQSM. Primary Care and [place] Instructor of [place] [employer] [facility]

## 2020-11-28 NOTE — Assessment & Plan Note (Signed)
Stephen King is a pleasant 70 year old male with a C5-C7 ACDF, adjacent level disease, after failure of conservative treatment we set him up for an epidural, C7-T1, he returns today feeling significantly better, he is more active, more functional, return as needed.

## 2020-12-10 ENCOUNTER — Telehealth: Payer: Self-pay | Admitting: Lab

## 2020-12-10 NOTE — Progress Notes (Signed)
  Chronic Care Management   Outreach Note  12/10/2020 Name: BERLYN MICKE MRN: [id number] DOB: Aug 26, 1950  Referred by: Donella Stade, PA-C Reason for referral : Medication Management   A second unsuccessful telephone outreach was attempted today. The patient was referred to pharmacist for assistance with care management and care coordination.  Follow Up Plan:   [place]

## 2020-12-14 ENCOUNTER — Other Ambulatory Visit: Payer: Self-pay | Admitting: Cardiology

## 2020-12-17 NOTE — Telephone Encounter (Signed)
Pt last saw Dr Marlou Porch 08/07/20, last labs 07/25/20 Creat 0.79, age 70, weight 102.1kg, CrCl 127.45, based on CrCl pt is on appropriate dosage of Xarelto 20mg  QD for afib.  Will refill rx.

## 2021-02-11 ENCOUNTER — Other Ambulatory Visit: Payer: Self-pay | Admitting: Cardiology

## 2021-02-27 ENCOUNTER — Ambulatory Visit (INDEPENDENT_AMBULATORY_CARE_PROVIDER_SITE_OTHER): Payer: Medicare Other | Admitting: Physician Assistant

## 2021-02-27 ENCOUNTER — Other Ambulatory Visit: Payer: Self-pay

## 2021-02-27 VITALS — BP 117/73 | HR 60 | Ht 70.0 in | Wt 222.0 lb

## 2021-02-27 DIAGNOSIS — L570 Actinic keratosis: Secondary | ICD-10-CM

## 2021-02-27 DIAGNOSIS — L821 Other seborrheic keratosis: Secondary | ICD-10-CM

## 2021-02-27 NOTE — Patient Instructions (Signed)
Seborrheic Keratosis A seborrheic keratosis is a common, noncancerous (benign) skin growth. These growths are velvety, waxy, rough, tan, brown, or black spots that appear on the skin. These skin growths can be flat or raised, and scaly. What are the causes? The cause of this condition is not known. What increases the risk? You are more likely to develop this condition if you: Have a family history of seborrheic keratosis. Are 50 or older. Are pregnant. Have had estrogen replacement therapy. What are the signs or symptoms? Symptoms of this condition include growths on the face, chest, shoulders, back, or other areas. These growths: Are usually painless, but may become irritated and itchy. Can be yellow, brown, black, or other colors. Are slightly raised or have a flat surface. Are sometimes rough or wart-like in texture. Are often velvety or waxy on the surface. Are round or oval-shaped. Often occur in groups, but may occur as a single growth. How is this diagnosed? This condition is diagnosed with a medical history and physical exam. A sample of the growth may be tested (skin biopsy). You may need to see a skin specialist (dermatologist). How is this treated? Treatment is not usually needed for this condition, unless the growths are irritated or bleed often. You may also choose to have the growths removed if you do not like their appearance. Most commonly, these growths are treated with a procedure in which liquid nitrogen is applied to "freeze" off the growth (cryosurgery). They may also be burned off with electricity (electrocautery) or removed by scraping (curettage). Follow these instructions at home: Watch your growth for any changes. Keep all follow-up visits as told by your health care provider. This is important. Do not scratch or pick at the growth or growths. This can cause them to become irritated or infected. Contact a health care provider if: You suddenly have many new  growths. Your growth bleeds, itches, or hurts. Your growth suddenly becomes larger or changes color. Summary A seborrheic keratosis is a common, noncancerous (benign) skin growth. Treatment is not usually needed for this condition, unless the growths are irritated or bleed often. Watch your growth for any changes. Contact a health care provider if you suddenly have many new growths or your growth suddenly becomes larger or changes color. Keep all follow-up visits as told by your health care provider. This is important. This information is not intended to replace advice given to you by your health care provider. Make sure you discuss any questions you have with your health care provider. Document Revised: 07/23/2017 Document Reviewed: 07/23/2017 Elsevier Patient Education  2022 [place]. Actinic Keratosis An actinic keratosis is a precancerous growth on the skin. If there is more than one growth, the condition is called actinic keratoses. Actinic keratoses appear most often on areas of skin that get a lot of sun exposure, including the scalp, face, ears, lips, upper back, forearms, and the backs of the hands. If left untreated, these growths may develop into a skin cancer called squamous cell carcinoma. It is important to have all these growths checked by a health care provider to determine the best treatment approach. What are the causes? Actinic keratoses are caused by getting too much ultraviolet (UV) radiation from the sun or other UV light sources. What increases the risk? You are more likely to develop this condition if you: Have light-colored skin and blue eyes. Have blond or red hair. Spend a lot of time in the sun. Do not protect your skin from the  sun when outdoors. Are an older person. The risk of developing an actinic keratosis increases with age. What are the signs or symptoms? Actinic keratoses feel like scaly, rough spots of skin. Symptoms of this condition include growths  that may: Be as small as a pinhead or as big as a quarter. Itch, hurt, or feel sensitive. Be skin-colored, light tan, dark tan, pink, or a combination of any of these colors. In most cases, the growths become red. Have a small piece of pink or gray skin (skin tag) growing from them. It may be easier to notice actinic keratoses by feeling them, rather than seeing them. Sometimes, actinic keratoses disappear, but many reappear a few days to a few weeks later. How is this diagnosed? This condition is usually diagnosed with a physical exam. A tissue sample may be removed from the actinic keratosis and examined under a microscope (biopsy). How is this treated? If needed, this condition may be treated by: Scraping off the actinic keratosis (curettage). Freezing the actinic keratosis with liquid nitrogen (cryosurgery). This causes the growth to eventually fall off the skin. Applying medicated creams or gels to destroy the cells in the growth. Applying chemicals to the actinic keratosis to make the outer layers of skin peel off (chemical peel). Using photodynamic therapy. In this procedure, medicated cream is applied to the actinic keratosis. This cream increases your skin's sensitivity to light. Then, a strong light is aimed at the actinic keratosis to destroy cells in the growth. Follow these instructions at home: Skin care Apply cool, wet cloths (cool compresses) to the affected areas. Do not scratch your skin. Check your skin regularly for any growths, especially growths that: Start to itch or bleed. Change in size, shape, or color. Caring for the treated area Keep the treated area clean and dry as told by your health care provider. Do not apply any medicine, cream, or lotion to the treated area unless your health care provider tells you to do that. Do not pick at blisters or try to break them open. This can cause infection and scarring. If you have red or irritated skin after treatment,  follow instructions from your health care provider about how to take care of the treated area. Make sure you: Wash your hands with soap and water before you change your bandage (dressing). If soap and water are not available, use hand sanitizer. Change your dressing as told by your health care provider. If you have red or irritated skin after treatment, check your treated area every day for signs of infection. Check for: Redness, swelling, or pain. Fluid or blood. Warmth. Pus or a bad smell. General instructions Take or apply over-the-counter and prescription medicines only as told by your health care provider. Return to your normal activities as told by your health care provider. Ask your health care provider what activities are safe for you. Have a skin exam done every year by a health care provider who is a skin specialist (dermatologist). Keep all follow-up visits as told by your health care provider. This is important. Lifestyle Do not use any products that contain nicotine or tobacco, such as cigarettes and e-cigarettes. If you need help quitting, ask your health care provider. Take steps to protect your skin from the sun. Try to avoid the sun between 10:00 a.m. and 4:00 p.m. This is when the UV light is the strongest. Use a sunscreen or sunblock with SPF 30 (sun protection factor 30) or greater. Apply sunscreen before you are exposed  to sunlight and reapply as often as directed by the instructions on the sunscreen container. Always wear sunglasses that have UV protection, and always wear a hat and clothing to protect your skin from sunlight. When possible, avoid medicines that increase your sensitivity to sunlight. Do not use tanning beds or other indoor tanning devices. Contact a health care provider if: You notice any changes or new growths on your skin. You have swelling, pain, or more redness around your treated area. You have fluid or blood coming from your treated area. Your  treated area feels warm to the touch. You have pus or a bad smell coming from your treated area. You have a fever. You have a blister that becomes large and painful. Summary An actinic keratosis is a precancerous growth on the skin. If there is more than one growth, the condition is called actinic keratoses. In some cases, if left untreated, these growths can develop into skin cancer. Check your skin regularly for any growths, especially growths that start to itch or bleed, or change in size, shape, or color. Take steps to protect your skin from the sun. Contact a health care provider if you notice any changes or new growths on your skin. Keep all follow-up visits as told by your health care provider. This is important. This information is not intended to replace advice given to you by your health care provider. Make sure you discuss any questions you have with your health care provider. Document Revised: 07/21/2017 Document Reviewed: 07/21/2017 Elsevier Patient Education  [place].

## 2021-02-27 NOTE — Progress Notes (Signed)
   Subjective:    Patient ID: Stephen King, male    DOB: 1950/09/18, 70 y.o.   MRN: [id number]  HPI Pt is a 70 yo male who presents to the clinic to have some skin concerns looked at.   One area for the last week in the right temple and then scaly lesions all over both arms.    Review of Systems See HPI.     Objective:   Physical Exam Multiple AKs on bilateral arms. 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  Diagnoses and all orders for this visit:  Actinic keratosis  Seborrheic keratoses  Cryotherapy Procedure Note  Pre-operative Diagnosis: Actinic keratosis  Post-operative Diagnosis: Actinic keratosis  Locations: right temple, bilateral forearms and hands  Indications: precancerous  Procedure Details  History of allergy to iodine: no. Pacemaker? no.  Patient informed of risks (permanent scarring, infection, light or dark discoloration, bleeding, infection, weakness, numbness and recurrence of the lesion) and benefits of the procedure and verbal informed consent obtained.  The areas are treated with liquid nitrogen therapy, frozen until ice ball extended 2 mm beyond lesion, allowed to thaw, and treated again. The patient tolerated procedure well.  The patient was instructed on post-op care, warned that there may be blister formation, redness and pain. Recommend OTC analgesia as needed for pain.  Condition: Stable  Complications: none.  Plan: 1. Instructed to keep the area dry and covered for 24-48h and clean thereafter. 2. Warning signs of infection were reviewed.   3. Recommended that the patient use OTC acetaminophen as needed for pain.   ? Ak vs early [place]? On right temple Cryto today if does not resolve will biopsy Discussed AK vs SK vs BCC

## 2021-03-01 ENCOUNTER — Encounter: Payer: Self-pay | Admitting: Physician Assistant

## 2021-04-29 ENCOUNTER — Encounter: Payer: Self-pay | Admitting: Physician Assistant

## 2021-05-30 ENCOUNTER — Telehealth: Payer: Self-pay

## 2021-05-30 NOTE — Telephone Encounter (Signed)
Patient Name: Stephen King  ?DOB: 11/30/1950 ?MRN: [id number] ? ?Diagnosis of atrial fibrillation ? ?Request to hold Xarelto for 2 days prior to colonoscopy on 07/10/21.   ?

## 2021-05-30 NOTE — Telephone Encounter (Signed)
? ?  Pre-operative Risk Assessment  ?  ?Patient Name: Stephen King  ?DOB: 12/23/1950 ?MRN: [id number]  ? ?  ? ?Request for Surgical Clearance   ? ?Procedure:   Colonoscopy ? ?Date of Surgery:  Clearance 07/10/21                              ?   ?Surgeon:  Dr. Marye Round ?Surgeon's Group or Practice Name:  [facility] ?Phone number:  [phone] ?Fax number:  [phone] ?  ?Type of Clearance Requested:   ?- Pharmacy:  Hold Rivaroxaban (Xarelto) for 2 days ?  ?Type of Anesthesia:  Not Indicated ?  ?Additional requests/questions:   None ? ?Signed, ?Savva Beamer   ?05/30/2021, 10:46 AM  ? ?

## 2021-05-31 NOTE — Telephone Encounter (Signed)
Patient with diagnosis of afib on Xarelto for anticoagulation.   ? ?Procedure: colonoscopy ?Date of procedure: 07/10/21 ? ?CHA2DS2-VASc Score = 4  ?This indicates a 4.8% annual risk of stroke. ?The patient's score is based upon: ?CHF History: 0 ?HTN History: 0 ?Diabetes History: 0 ?Stroke History: 2 ?Vascular Disease History: 1 ?Age Score: 1 ?Gender Score: 0 ?  ?CrCl >128m/min ?Platelet count 281K ? ?Would see if office performing procedure is ok with 1 day Xarelto hold instead - preference for shorter hold given prior hx of afib and CVA. ?

## 2021-05-31 NOTE — Telephone Encounter (Signed)
? ?  Name: Stephen King  ?DOB: [birth year]  ?MRN: [id number]  ? ?Primary Cardiologist: Candee Furbish, MD ? ?Chart reviewed as part of pre-operative protocol coverage.  ? ?We are asked for guidance to hold xarelto. Per our clinical pharmacist: ? ?Patient with diagnosis of afib on Xarelto for anticoagulation.   ?  ?Procedure: colonoscopy ?Date of procedure: 07/10/21 ?  ?CHA2DS2-VASc Score = 4  ?This indicates a 4.8% annual risk of stroke. ?The patient's score is based upon: ?CHF History: 0 ?HTN History: 0 ?Diabetes History: 0 ?Stroke History: 2 ?Vascular Disease History: 1 ?Age Score: 1 ?Gender Score: 0 ?  ?CrCl >112m/min ?Platelet count 281K ?  ?Recommend only 1 day Xarelto hold instead of 2 days - preference for shorter hold given prior hx of afib and CVA. ? ? ?I will route this recommendation to the requesting party via Epic fax function and remove from pre-op pool. Please call with questions. ? ?ALedora Bottcher PA ?05/31/2021, 12:49 PM ? ?

## 2021-07-03 DIAGNOSIS — Z86010 Personal history of colonic polyps: Secondary | ICD-10-CM | POA: Diagnosis not present

## 2021-07-03 DIAGNOSIS — I4891 Unspecified atrial fibrillation: Secondary | ICD-10-CM | POA: Diagnosis not present

## 2021-07-03 DIAGNOSIS — Z7901 Long term (current) use of anticoagulants: Secondary | ICD-10-CM | POA: Diagnosis not present

## 2021-07-10 DIAGNOSIS — Z1211 Encounter for screening for malignant neoplasm of colon: Secondary | ICD-10-CM | POA: Diagnosis not present

## 2021-07-10 DIAGNOSIS — D122 Benign neoplasm of ascending colon: Secondary | ICD-10-CM | POA: Diagnosis not present

## 2021-07-10 DIAGNOSIS — K635 Polyp of colon: Secondary | ICD-10-CM | POA: Diagnosis not present

## 2021-07-10 DIAGNOSIS — I4891 Unspecified atrial fibrillation: Secondary | ICD-10-CM | POA: Diagnosis not present

## 2021-07-10 DIAGNOSIS — Z800 Family history of malignant neoplasm of digestive organs: Secondary | ICD-10-CM | POA: Diagnosis not present

## 2021-07-10 DIAGNOSIS — Z86010 Personal history of colonic polyps: Secondary | ICD-10-CM | POA: Diagnosis not present

## 2021-08-29 ENCOUNTER — Other Ambulatory Visit: Payer: Self-pay | Admitting: Cardiology

## 2021-09-01 ENCOUNTER — Other Ambulatory Visit: Payer: Self-pay | Admitting: Cardiology

## 2021-09-21 ENCOUNTER — Other Ambulatory Visit: Payer: Self-pay | Admitting: Cardiology

## 2021-09-21 DIAGNOSIS — I4891 Unspecified atrial fibrillation: Secondary | ICD-10-CM

## 2021-09-23 NOTE — Telephone Encounter (Addendum)
Xarelto '20mg'$  refill request received. Pt is 71 years old, weight-100.7kg, Crea-0.79 on 07/25/2020-NEEDS UPDATED LABS, last seen by Dr. Marlou Porch on 08/07/2020-NEEDS APPT Diagnosis-Afib, CrCl-123.93ML/MIN; Dose is appropriate based on dosing criteria.   Pt needs an appt with Cardiologist and labs. Pt now has an appt on 11/05/2021; will send in refill at this time.

## 2021-10-03 ENCOUNTER — Encounter: Payer: Self-pay | Admitting: Physician Assistant

## 2021-10-03 DIAGNOSIS — R21 Rash and other nonspecific skin eruption: Secondary | ICD-10-CM

## 2021-10-04 MED ORDER — CLOBETASOL PROPIONATE 0.05 % EX CREA: 1.0000 | TOPICAL_CREAM | Freq: Two times a day (BID) | CUTANEOUS | 0 refills | Status: DC

## 2021-10-08 ENCOUNTER — Other Ambulatory Visit: Payer: Self-pay | Admitting: Cardiology

## 2021-11-05 ENCOUNTER — Ambulatory Visit: Payer: Medicare Other | Admitting: Cardiology

## 2021-11-05 ENCOUNTER — Encounter: Payer: Self-pay | Admitting: Cardiology

## 2021-11-05 VITALS — BP 100/70 | HR 59 | Ht 70.0 in | Wt 220.0 lb

## 2021-11-05 DIAGNOSIS — I2510 Atherosclerotic heart disease of native coronary artery without angina pectoris: Secondary | ICD-10-CM

## 2021-11-05 DIAGNOSIS — I480 Paroxysmal atrial fibrillation: Secondary | ICD-10-CM | POA: Diagnosis not present

## 2021-11-05 DIAGNOSIS — Z79899 Other long term (current) drug therapy: Secondary | ICD-10-CM

## 2021-11-05 NOTE — Progress Notes (Signed)
Cardiology Office Note:    Date:  11/05/2021   ID:  Stephen King, DOB 04/17/1950, MRN [id number]  PCP:  Lavada Mesi   [facility] Providers Cardiologist:  Candee Furbish, MD Electrophysiologist:  Will Meredith Leeds, MD     Referring MD: Donella Stade, PA-C    History of Present Illness:    Stephen King is a 71 y.o. male here for coronary disease follow-up.  Circumflex stent was patent and 06/20/2019.  Overall feeling well.  No chest pain fevers chills nausea vomiting syncope.  Past Medical History:  Diagnosis Date   Arthritis    "?right knee" (08/13/2017)   Dysrhythmia    PAF   Hernia, umbilical    History of hiatal hernia    History of kidney stones    Stroke [facility]) 12/2009   "has to watch where he's walking since; more of a spacial thing resulting from stroke" (08/13/2017)    Past Surgical History:  Procedure Laterality Date   ABDOMINAL HERNIA REPAIR     "has a mesh in his abdomen"   ANTERIOR CERVICAL DECOMP/DISCECTOMY FUSION  10/2005   C6-7   ATRIAL FIBRILLATION ABLATION N/A 09/17/2017   Procedure: ATRIAL FIBRILLATION ABLATION;  Surgeon: Constance Haw, MD;  Location: [place] [facility];  Service: Cardiovascular;  Laterality: N/A;   BACK SURGERY     CARDIAC CATHETERIZATION  2003   CATARACT EXTRACTION W/ INTRAOCULAR LENS  IMPLANT, BILATERAL Bilateral    CORONARY ANGIOPLASTY WITH STENT PLACEMENT  08/13/2017   CORONARY STENT INTERVENTION N/A 08/13/2017   Procedure: CORONARY STENT INTERVENTION;  Surgeon: Burnell Blanks, MD;  Location: [place] [facility];  Service: Cardiovascular;  Laterality: N/A;   EXCISIONAL HEMORRHOIDECTOMY     HERNIA REPAIR     LEFT HEART CATH AND CORONARY ANGIOGRAPHY N/A 08/13/2017   Procedure: LEFT HEART CATH AND CORONARY ANGIOGRAPHY;  Surgeon: Burnell Blanks, MD;  Location: [place] [facility];  Service: Cardiovascular;  Laterality: N/A;   LEFT HEART CATH AND CORONARY ANGIOGRAPHY N/A 06/20/2019   Procedure:  LEFT HEART CATH AND CORONARY ANGIOGRAPHY;  Surgeon: Nelva Bush, MD;  Location: [place] [facility];  Service: Cardiovascular;  Laterality: N/A;   SHOULDER ARTHROSCOPY WITH ROTATOR CUFF REPAIR Right 10/30/2014   Procedure: SHOULDER ARTHROSCOPY WITH ROTATOR CUFF REPAIR;  Surgeon: Tania Ade, MD;  Location: [place];  Service: Orthopedics;  Laterality: Right;   SHOULDER ARTHROSCOPY WITH SUBACROMIAL DECOMPRESSION Right 10/30/2014   Procedure: Right shoulder arthroscopy rotator cuff repair, subacromial decompression;  Surgeon: Tania Ade, MD;  Location: [place];  Service: Orthopedics;  Laterality: Right;  Right shoulder arthrscopy rotator cuff repair, subacromial decompression    Current Medications: Current Meds  Medication Sig   atorvastatin (LIPITOR) 80 MG tablet Take 1 tablet (80 mg total) by mouth daily. KEEP UPCOMING APPOINTMENT TO ASK FOR REFILLS FOR 1 YEAR SUPPLY.   metoprolol tartrate (LOPRESSOR) 25 MG tablet TAKE ONE-HALF TABLET BY  MOUTH TWICE DAILY (Patient taking differently: prn)   nitroGLYCERIN (NITROSTAT) 0.4 MG SL tablet Place 0.4 mg under the tongue every 5 (five) minutes x 3 doses as needed for chest pain.   rivaroxaban (XARELTO) 20 MG TABS tablet TAKE 1 TABLET BY MOUTH  DAILY WITH SUPPER     Allergies:   Feldene [piroxicam]   Social History   Socioeconomic History   Marital status: Married    Spouse name: Earlie Server   Number of children: 2   Years of education: 53  Highest education level: Some college, no degree  Occupational History    Comment: Retired-working part-time  Tobacco Use   Smoking status: Former    Years: 27.00    Types: Cigarettes    Quit date: 03/25/1999    Years since quitting: 22.6   Smokeless tobacco: Never  Vaping Use   Vaping Use: Never used  Substance and Sexual Activity   Alcohol use: No   Drug use: No   Sexual activity: Not Currently  Other Topics Concern   Not on file  Social History Narrative    Lives with wife. His daughter and granddaughter are living with him temporarily.   Social Determinants of Health   Financial Resource Strain: Low Risk  (10/02/2020)   Overall Financial Resource Strain (CARDIA)    Difficulty of Paying Living Expenses: Not hard at all  Food Insecurity: No Food Insecurity (10/02/2020)   Hunger Vital Sign    Worried About Running Out of Food in the Last Year: Never true    Ran Out of Food in the Last Year: Never true  Transportation Needs: No Transportation Needs (10/02/2020)   PRAPARE - Hydrologist (Medical): No    Lack of Transportation (Non-Medical): No  Physical Activity: Insufficiently Active (10/02/2020)   Exercise Vital Sign    Days of Exercise per Week: 3 days    Minutes of Exercise per Session: 30 min  Stress: Stress Concern Present (10/02/2020)   [place]    Feeling of Stress : To some extent  Social Connections: Moderately Integrated (10/02/2020)   Social Connection and Isolation Panel [NHANES]    Frequency of Communication with Friends and Family: Once a week    Frequency of Social Gatherings with Friends and Family: More than three times a week    Attends Religious Services: More than 4 times per year    Active Member of [employer] or Organizations: No    Attends Music therapist: Never    Marital Status: Married     Family History: The patient's family history includes Alcohol abuse in his father and mother; Arrhythmia in his mother; Colon cancer in his father; Stroke in his maternal grandfather.  ROS:   Please see the history of present illness.     All other systems reviewed and are negative.  EKGs/Labs/Other Studies Reviewed:    The following studies were reviewed today: He underwent cardiac catheterization 06/20/2019: Non-obstructive coronary artery disease, with mild to moderate disease involving the LCx and RCA. Widely  patent LCx stent. Normal left ventricular systolic function with upper normal to mildly elevated filling pressure.   Recommendations: Medical therapy for secondary prevention of coronary artery disease.  I do not see a critical lesion to explain rest pain. If no evidence of bleeding or vascular complication at right radial arteriotomy site, rivaroxaban can be restarted tomorrow. Empiric trial of PPI, as atypical chest pain may be related to hiatal hernia and a component of GERD/esophagitis. Diagnostic Dominance: Right      Prior unsuccessful cardioversion for atrial fibrillation.  Had an ablation in 2019 by Dr. Curt Bears.      EKG:  EKG is  ordered today.  The ekg ordered today demonstrates sinus rhythm 59 with PACs  Recent Labs: No results found for requested labs within last 365 days.  Recent Lipid Panel    Component Value Date/Time   CHOL 97 (L) 06/16/2019 1121   TRIG 79 06/16/2019 1121  HDL 45 06/16/2019 1121   CHOLHDL 2.2 06/16/2019 1121   CHOLHDL 2.3 11/27/2017 1127   VLDL 8 11/02/2015 0804   LDLCALC 36 06/16/2019 1121   LDLCALC 44 11/27/2017 1127     Risk Assessment/Calculations:              Physical Exam:    VS:  BP 100/70 (BP Location: Left Arm, Patient Position: Sitting, Cuff Size: Normal)   Pulse (!) 59   Ht '5\' 10"'$  (1.778 m)   Wt 220 lb (99.8 kg)   BMI 31.57 kg/m     Wt Readings from Last 3 Encounters:  11/05/21 220 lb (99.8 kg)  02/27/21 222 lb (100.7 kg)  10/02/20 225 lb 0.6 oz (102.1 kg)     GEN:  Well nourished, well developed in no acute distress HEENT: Normal NECK: No JVD; No carotid bruits LYMPHATICS: No lymphadenopathy CARDIAC: RRR, no murmurs, no rubs, gallops RESPIRATORY:  Clear to auscultation without rales, wheezing or rhonchi  ABDOMEN: Soft, non-tender, non-distended MUSCULOSKELETAL:  No edema; No deformity  SKIN: Warm and dry NEUROLOGIC:  Alert and oriented x 3 PSYCHIATRIC:  Normal affect   ASSESSMENT:    1. Coronary  artery disease involving native coronary artery of native heart without angina pectoris   2. Medication management   3. Paroxysmal atrial fibrillation (HCC)    PLAN:    In order of problems listed above:  Coronary artery disease - Previously placed circumflex stent in 2021 is widely patent from heart catheterization recently.  No anginal symptoms.  His symptoms that prompted cardiac catheterization were likely GERD related.  Feels better with Protonix.  Continue with current medical medication management. -Previously we had stopped metoprolol because of hypotension.   Paroxysmal atrial fibrillation - Has not had a recent episode of atrial fibrillation.  Feels well.  Hydrate well. - Dr. Curt Bears performed ablation in the past.  CHA2DS2-VASc score is 4. -On Xarelto doing well without any bleeding issues.  Hemoglobin 15.1, creatinine 0.79 potassium 4.4 previously.  Doing well     Obstructive sleep apnea - Continue with CPAP.  No changes made.   Prior stroke 2011 - Expressed the importance of anticoagulation.  He continues with his Xarelto.   Hyperlipidemia - Continue with atorvastatin 80 mg high intensity medication management.  ALT 18 LDL 36.          Medication Adjustments/Labs and Tests Ordered: Current medicines are reviewed at length with the patient today.  Concerns regarding medicines are outlined above.  Orders Placed This Encounter  Procedures   CBC   Comprehensive metabolic panel   Lipid panel   EKG 12-Lead   No orders of the defined types were placed in this encounter.   Patient Instructions  Medication Instructions:  The current medical regimen is effective;  continue present plan and medications.  *If you need a refill on your cardiac medications before your next appointment, please call your pharmacy*   Lab Work: Please have blood work today (CMP, CBC and Lipid)  If you have labs (blood work) drawn today and your tests are completely normal, you will  receive your results only by: MyChart Message (if you have MyChart) OR A paper copy in the mail If you have any lab test that is abnormal or we need to change your treatment, we will call you to review the results.  Follow-Up: At [facility], you and your health needs are our priority.  As part of our continuing mission to provide you with exceptional  heart care, we have created designated Provider Care Teams.  These Care Teams include your primary Cardiologist (physician) and Advanced Practice Providers (APPs -  Physician Assistants and Nurse Practitioners) who all work together to provide you with the care you need, when you need it.  We recommend signing up for the patient portal called "MyChart".  Sign up information is provided on this After Visit Summary.  MyChart is used to connect with patients for Virtual Visits (Telemedicine).  Patients are able to view lab/test results, encounter notes, upcoming appointments, etc.  Non-urgent messages can be sent to your provider as well.   To learn more about what you can do with MyChart, go to [email or web address].    Your next appointment:   1 year(s)  The format for your next appointment:   In Person  Provider:   Candee Furbish, MD {   Important Information About Sugar         Signed, Candee Furbish, MD  11/05/2021 4:23 PM    [place]

## 2021-11-05 NOTE — Patient Instructions (Signed)
Medication Instructions:  The current medical regimen is effective;  continue present plan and medications.  *If you need a refill on your cardiac medications before your next appointment, please call your pharmacy*   Lab Work: Please have blood work today (CMP, CBC and Lipid)  If you have labs (blood work) drawn today and your tests are completely normal, you will receive your results only by: MyChart Message (if you have MyChart) OR A paper copy in the mail If you have any lab test that is abnormal or we need to change your treatment, we will call you to review the results.  Follow-Up: At [facility], you and your health needs are our priority.  As part of our continuing mission to provide you with exceptional heart care, we have created designated Provider Care Teams.  These Care Teams include your primary Cardiologist (physician) and Advanced Practice Providers (APPs -  Physician Assistants and Nurse Practitioners) who all work together to provide you with the care you need, when you need it.  We recommend signing up for the patient portal called "MyChart".  Sign up information is provided on this After Visit Summary.  MyChart is used to connect with patients for Virtual Visits (Telemedicine).  Patients are able to view lab/test results, encounter notes, upcoming appointments, etc.  Non-urgent messages can be sent to your provider as well.   To learn more about what you can do with MyChart, go to [email or web address].    Your next appointment:   1 year(s)  The format for your next appointment:   In Person  Provider:   Candee Furbish, MD {   Important Information About Sugar

## 2021-11-06 ENCOUNTER — Other Ambulatory Visit: Payer: Self-pay | Admitting: Cardiology

## 2021-11-06 LAB — CBC
Hematocrit: 42.5 % (ref 37.5–51.0)
Hemoglobin: 15 g/dL (ref 13.0–17.7)
MCH: 32.5 pg (ref 26.6–33.0)
MCHC: 35.3 g/dL (ref 31.5–35.7)
MCV: 92 fL (ref 79–97)
Platelets: 258 10*3/uL (ref 150–450)
RBC: 4.61 x10E6/uL (ref 4.14–5.80)
RDW: 12.6 % (ref 11.6–15.4)
WBC: 8.1 10*3/uL (ref 3.4–10.8)

## 2021-11-06 LAB — COMPREHENSIVE METABOLIC PANEL
ALT: 22 IU/L (ref 0–44)
AST: 19 IU/L (ref 0–40)
Albumin/Globulin Ratio: 1.7 (ref 1.2–2.2)
Albumin: 3.9 g/dL (ref 3.9–4.9)
Alkaline Phosphatase: 83 IU/L (ref 44–121)
BUN/Creatinine Ratio: 12 (ref 10–24)
BUN: 11 mg/dL (ref 8–27)
Bilirubin Total: 0.7 mg/dL (ref 0.0–1.2)
CO2: 25 mmol/L (ref 20–29)
Calcium: 9 mg/dL (ref 8.6–10.2)
Chloride: 104 mmol/L (ref 96–106)
Creatinine, Ser: 0.93 mg/dL (ref 0.76–1.27)
Globulin, Total: 2.3 g/dL (ref 1.5–4.5)
Glucose: 97 mg/dL (ref 70–99)
Potassium: 4.2 mmol/L (ref 3.5–5.2)
Sodium: 141 mmol/L (ref 134–144)
Total Protein: 6.2 g/dL (ref 6.0–8.5)
eGFR: 88 mL/min/{1.73_m2} (ref >59)

## 2021-11-06 LAB — LIPID PANEL
Chol/HDL Ratio: 2.3 ratio (ref 0.0–5.0)
Cholesterol, Total: 95 mg/dL — ABNORMAL LOW (ref 100–199)
HDL: 41 mg/dL (ref >39)
LDL Chol Calc (NIH): 37 mg/dL (ref 0–99)
Triglycerides: 86 mg/dL (ref 0–149)
VLDL Cholesterol Cal: 17 mg/dL (ref 5–40)

## 2021-11-13 ENCOUNTER — Encounter: Payer: Self-pay | Admitting: General Practice

## 2021-11-25 ENCOUNTER — Other Ambulatory Visit: Payer: Self-pay | Admitting: Cardiology

## 2021-11-25 DIAGNOSIS — I4891 Unspecified atrial fibrillation: Secondary | ICD-10-CM

## 2021-11-26 NOTE — Telephone Encounter (Signed)
Prescription refill request for Xarelto received.  Indication:Afib Last office visit:8/23 Weight:99.8 kg Age:71 Scr:0.9 CrCl:107.91  Prescription refilled

## 2021-12-11 ENCOUNTER — Ambulatory Visit (INDEPENDENT_AMBULATORY_CARE_PROVIDER_SITE_OTHER): Payer: Medicare Other | Admitting: Physician Assistant

## 2021-12-11 VITALS — BP 117/68 | HR 60 | Ht 70.0 in | Wt 221.1 lb

## 2021-12-11 DIAGNOSIS — Z0000 Encounter for general adult medical examination without abnormal findings: Secondary | ICD-10-CM

## 2021-12-11 DIAGNOSIS — Z23 Encounter for immunization: Secondary | ICD-10-CM | POA: Diagnosis not present

## 2021-12-11 NOTE — Progress Notes (Signed)
MEDICARE ANNUAL WELLNESS VISIT  12/11/2021  Subjective:  Stephen King is a 71 y.o. male patient of Alden Hipp, Royetta Car, PA-C who had a Medicare Annual Wellness Visit today. Stephen King is Working part time and lives with Stephen King. Stephen King has 2 children. Stephen King reports that Stephen King is socially active and does interact with friends/King regularly. Stephen King is minimally physically active and enjoys working with model trains and wood working.  Patient Care Team: Lavada Mesi as PCP - General (King Medicine) Jerline Pain, MD as PCP - Cardiology (Cardiology) Constance Haw, MD as PCP - Electrophysiology (Cardiology)     12/11/2021    9:58 AM 10/02/2020    9:09 AM 06/20/2019    6:06 AM 12/14/2017    8:26 PM 10/27/2017    8:42 PM 09/17/2017    6:11 AM 08/13/2017    3:01 PM  Advanced Directives  Does Patient Have a Medical Advance Directive? Yes Yes Yes Yes Yes Yes Yes  Type of Advance Directive Living will Living will Living will;Healthcare Power of Attorney Living will;Healthcare Power of Attorney Living will;Healthcare Power of [place];Living will [place];Living will  Does patient want to make changes to medical advance directive? No - Patient declined No - Patient declined No - Patient declined No - Patient declined No - Patient declined No - Patient declined   Copy of [place] in Chart?   No - copy requested No - copy requested No - copy requested No - copy requested No - copy requested    Hospital Utilization Over the Past 12 Months: # of hospitalizations or ER visits: 0 # of surgeries: 0  Review of Systems    Patient reports that Stephen King overall health is unchanged when compared to last year.  Review of Systems: History obtained from chart review and the patient  All other systems negative.  Pain Assessment Pain : No/denies pain     Current Medications & Allergies (verified) Allergies as of 12/11/2021        Reactions   Feldene [piroxicam] Hives        Medication List        Accurate as of December 11, 2021 10:13 AM. If you have any questions, ask your nurse or doctor.          atorvastatin 80 MG tablet Commonly known as: LIPITOR TAKE 1 TABLET BY MOUTH DAILY   metoprolol tartrate 25 MG tablet Commonly known as: LOPRESSOR TAKE ONE-HALF TABLET BY  MOUTH TWICE DAILY What changed:  how much to take how to take this when to take this additional instructions   nitroGLYCERIN 0.4 MG SL tablet Commonly known as: NITROSTAT Place 0.4 mg under the tongue every 5 (five) minutes x 3 doses as needed for chest pain.   Xarelto 20 MG Tabs tablet Generic drug: rivaroxaban TAKE 1 TABLET BY MOUTH DAILY  WITH SUPPER        History (reviewed): Past Medical History:  Diagnosis Date   Allergy    Arthritis    "?right knee" (08/13/2017)   Cataract    Dysrhythmia    PAF   GERD (gastroesophageal reflux disease)    Hernia, umbilical    History of hiatal hernia    History of kidney stones    Stroke [facility]) 12/2009   "has to watch where Stephen King's walking since; more of a spacial thing resulting from stroke" (08/13/2017)   Past Surgical History:  Procedure Laterality Date  ABDOMINAL HERNIA REPAIR     "has a mesh in Stephen King abdomen"   ANTERIOR CERVICAL DECOMP/DISCECTOMY FUSION  10/2005   C6-7   ATRIAL FIBRILLATION ABLATION N/A 09/17/2017   Procedure: ATRIAL FIBRILLATION ABLATION;  Surgeon: Constance Haw, MD;  Location: [place];  Service: Cardiovascular;  Laterality: N/A;   BACK SURGERY     CARDIAC CATHETERIZATION  2003   CATARACT EXTRACTION W/ INTRAOCULAR LENS  IMPLANT, BILATERAL Bilateral    CORONARY ANGIOPLASTY WITH STENT PLACEMENT  08/13/2017   CORONARY STENT INTERVENTION N/A 08/13/2017   Procedure: CORONARY STENT INTERVENTION;  Surgeon: Burnell Blanks, MD;  Location: [place] [facility];  Service: Cardiovascular;  Laterality: N/A;   EXCISIONAL HEMORRHOIDECTOMY      EYE SURGERY     HERNIA REPAIR     LEFT HEART CATH AND CORONARY ANGIOGRAPHY N/A 08/13/2017   Procedure: LEFT HEART CATH AND CORONARY ANGIOGRAPHY;  Surgeon: Burnell Blanks, MD;  Location: [place] [facility];  Service: Cardiovascular;  Laterality: N/A;   LEFT HEART CATH AND CORONARY ANGIOGRAPHY N/A 06/20/2019   Procedure: LEFT HEART CATH AND CORONARY ANGIOGRAPHY;  Surgeon: Nelva Bush, MD;  Location: [place] [facility];  Service: Cardiovascular;  Laterality: N/A;   SHOULDER ARTHROSCOPY WITH ROTATOR CUFF REPAIR Right 10/30/2014   Procedure: SHOULDER ARTHROSCOPY WITH ROTATOR CUFF REPAIR;  Surgeon: Tania Ade, MD;  Location: [place];  Service: Orthopedics;  Laterality: Right;   SHOULDER ARTHROSCOPY WITH SUBACROMIAL DECOMPRESSION Right 10/30/2014   Procedure: Right shoulder arthroscopy rotator cuff repair, subacromial decompression;  Surgeon: Tania Ade, MD;  Location: [facility];  Service: Orthopedics;  Laterality: Right;  Right shoulder arthrscopy rotator cuff repair, subacromial decompression   SPINE SURGERY     King History  Problem Relation Age of Onset   Alcohol abuse Mother    Arrhythmia Mother    Colon cancer Father    Alcohol abuse Father    Stroke Maternal Grandfather    Social History   Socioeconomic History   Marital status: Married    Spouse name: Earlie Server   Number of children: 2   Years of education: 13   Highest education level: Some college, no degree  Occupational History    Comment: Retired-working part-time  Tobacco Use   Smoking status: Former    Years: 27.00    Types: Cigarettes, Cigars    Quit date: 03/25/1999    Years since quitting: 22.7   Smokeless tobacco: Never  Vaping Use   Vaping Use: Never used  Substance and Sexual Activity   Alcohol use: No   Drug use: No   Sexual activity: Not Currently  Other Topics Concern   Not on file  Social History Narrative   Lives with wife. Stephen King daughter and  granddaughter are living with him temporarily. Stephen King enjoys working with model trains and wood working.   Social Determinants of Health   Financial Resource Strain: Low Risk  (12/07/2021)   Overall Financial Resource Strain (CARDIA)    Difficulty of Paying Living Expenses: Not hard at all  Food Insecurity: No Food Insecurity (12/07/2021)   Hunger Vital Sign    Worried About Running Out of Food in the Last Year: Never true    Ran Out of Food in the Last Year: Never true  Transportation Needs: No Transportation Needs (12/07/2021)   PRAPARE - Hydrologist (Medical): No    Lack of Transportation (Non-Medical): No  Physical Activity: Unknown (12/07/2021)  Exercise Vital Sign    Days of Exercise per Week: 3 days    Minutes of Exercise per Session: Patient refused  Stress: Stress Concern Present (12/07/2021)   [place]    Feeling of Stress : Rather much  Social Connections: Moderately Isolated (12/11/2021)   Social Connection and Isolation Panel [NHANES]    Frequency of Communication with Friends and King: Never    Frequency of Social Gatherings with Friends and King: Never    Attends Religious Services: More than 4 times per year    Active Member of [employer] or Organizations: No    Attends Archivist Meetings: Never    Marital Status: Married    Activities of Daily Living    12/07/2021    6:55 PM  In your present state of health, do you have any difficulty performing the following activities:  Hearing? 0  Vision? 0  Difficulty concentrating or making decisions? 1  Walking or climbing stairs? 0  Dressing or bathing? 0  Doing errands, shopping? 0  Preparing Food and eating ? N  Using the Toilet? N  In the past six months, have you accidently leaked urine? N  Do you have problems with loss of bowel control? N  Managing your Medications? N  Managing your Finances? N  Housekeeping  or managing your Housekeeping? N    Patient Education/Literacy How often do you King to have someone help you when you read instructions, pamphlets, or other written materials from your doctor or pharmacy?: 3 - Sometimes What is the last grade level you completed in school?: 1.5 years of college  Exercise Current Exercise Habits: The patient has a physically strenuous job, but has no regular exercise apart from work., Exercise limited by: None identified  Diet Patient reports consuming 3 meals a day and 2 snack(s) a day Patient reports that Stephen King primary diet is: Regular Patient reports that she does have regular access to food.   Depression Screen    12/11/2021    9:50 AM 10/02/2020    9:10 AM 12/28/2019    3:04 PM 01/19/2019   10:59 AM 11/27/2017   10:39 AM 07/28/2017    9:00 AM  PHQ 2/9 Scores  PHQ - 2 Score 0 0 2 0 1 2  PHQ- 9 Score   '5 2  9     '$ Fall Risk    12/11/2021    9:50 AM 12/07/2021    6:55 PM 10/02/2020    9:09 AM 12/28/2019    3:04 PM 12/28/2019    2:42 PM  Fall Risk   Falls in the past year? 0 0 0 0 0  Number falls in past yr: 0 0 0 0 0  Injury with Fall? 0 0 0 0 0  Risk for fall due to : No Fall Risks  No Fall Risks    Follow up Falls evaluation completed  Falls evaluation completed Falls evaluation completed Falls evaluation completed     Objective:   BP 117/68 (BP Location: Right Arm, Patient Position: Sitting, Cuff Size: Normal)   Pulse 60   Ht '5\' 10"'$  (1.778 m)   Wt 221 lb 1.9 oz (100.3 kg)   SpO2 98%   BMI 31.73 kg/m   Last Weight  Most recent update: 12/11/2021  9:47 AM    Weight  100.3 kg (221 lb 1.9 oz)             Body mass index is 31.73  kg/m.  Hearing/Vision  Stephen King did not have difficulty with hearing/understanding during the face-to-face interview Stephen King did not have difficulty with Stephen King vision during the face-to-face interview Reports that Stephen King has not had a formal eye exam by an eye care professional within the past year Reports that Stephen King has  had a formal hearing evaluation within the past year  Cognitive Function:    12/11/2021    9:58 AM 10/02/2020    9:15 AM 11/27/2017   10:42 AM  6CIT Screen  What Year? 0 points 0 points 0 points  What month? 0 points 0 points 0 points  What time? 0 points 0 points 0 points  Count back from 20 0 points 0 points 0 points  Months in reverse 0 points 0 points 0 points  Repeat phrase 0 points 0 points 0 points  Total Score 0 points 0 points 0 points    Normal Cognitive Function Screening: Yes (Normal:0-7, Significant for Dysfunction: >8)  Immunization & Health Maintenance Record Immunization History  Administered Date(s) Administered   Fluad Quad(high Dose 65+) 01/19/2019, 12/11/2021   Influenza Split 02/04/2012   Influenza,inj,Quad PF,6+ Mos 03/07/2016, 11/17/2017   Influenza-Unspecified 03/06/2017, 12/23/2020   PFIZER(Purple Top)SARS-COV-2 Vaccination 04/14/2019, 05/06/2019, 12/23/2019   PNEUMOCOCCAL CONJUGATE-20 10/02/2020   Pneumococcal Polysaccharide-23 11/27/2017   Tdap 03/26/2009, 06/30/2019   Zoster Recombinat (Shingrix) 12/23/2020, 11/10/2021    Health Maintenance  Topic Date Due   COVID-19 Vaccine (4 - Pfizer risk series) 12/27/2021 (Originally 02/17/2020)   COLONOSCOPY (Pts 45-57yr Insurance coverage will King to be confirmed)  07/11/2026   TETANUS/TDAP  06/29/2029   Pneumonia Vaccine 70 Years old  Completed   INFLUENZA VACCINE  Completed   Hepatitis C Screening  Completed   Zoster Vaccines- Shingrix  Completed   HPV VACCINES  Aged Out       Assessment  This is a routine wellness examination for Stephen King  Health Maintenance: Due or Overdue There are no preventive care reminders to display for this patient.   Stephen King a referral for Community Assistance: Care Management:   no Social Work:    no Prescription Assistance:  no Nutrition/Diabetes Education:  no   Plan:  Personalized Goals  Goals Addressed               This  Visit's Progress     Patient Stated (pt-stated)        Would like loose 20 lbs.        Personalized Health Maintenance & Screening Recommendations  Influenza vaccine  Lung Cancer Screening Recommended: no (Low Dose CT Chest recommended if Age 70-80years, 20 pack-year currently smoking OR have quit w/in past 15 years) Hepatitis C Screening recommended: no HIV Screening recommended: no  Advanced Directives: Written information was not given per the patient's request.  Referrals & Orders Orders Placed This Encounter  Procedures   Flu Vaccine QUAD High Dose(Fluad)    Follow-up Plan Follow-up with BDonella Stade PA-C as planned Medicare wellness visit in one year.  AVS printed and given to the patient.   I have personally reviewed and noted the following in the patient's chart:   Medical and social history Use of alcohol, tobacco or illicit drugs  Current medications and supplements Functional ability and status Nutritional status Physical activity Advanced directives List of other physicians Hospitalizations, surgeries, and ER visits in previous 12 months Vitals Screenings to include cognitive, depression, and falls Referrals and appointments  In addition, I have reviewed  and discussed with patient certain preventive protocols, quality metrics, and best practice recommendations. A written personalized care plan for preventive services as well as general preventive health recommendations were provided to patient.     Tinnie Gens  12/11/2021

## 2021-12-11 NOTE — Patient Instructions (Addendum)
[place] Maintenance Summary and Written Plan of Care  Mr. Stephen King ,  Thank you for allowing me to perform your Medicare Annual Wellness Visit and for your ongoing commitment to your health.   Health Maintenance & Immunization History Health Maintenance  Topic Date Due   COVID-19 Vaccine (4 - Pfizer risk series) 12/27/2021 (Originally 02/17/2020)   COLONOSCOPY (Pts 45-74yr Insurance coverage will need to be confirmed)  07/11/2026   TETANUS/TDAP  06/29/2029   Pneumonia Vaccine 71 Years old  Completed   INFLUENZA VACCINE  Completed   Hepatitis C Screening  Completed   Zoster Vaccines- Shingrix  Completed   HPV VACCINES  Aged Out   Immunization History  Administered Date(s) Administered   Fluad Quad(high Dose 65+) 01/19/2019, 12/11/2021   Influenza Split 02/04/2012   Influenza,inj,Quad PF,6+ Mos 03/07/2016, 11/17/2017   Influenza-Unspecified 03/06/2017, 12/23/2020   PFIZER(Purple Top)SARS-COV-2 Vaccination 04/14/2019, 05/06/2019, 12/23/2019   PNEUMOCOCCAL CONJUGATE-20 10/02/2020   Pneumococcal Polysaccharide-23 11/27/2017   Tdap 03/26/2009, 06/30/2019   Zoster Recombinat (Shingrix) 12/23/2020, 11/10/2021    These are the patient goals that we discussed:  Goals Addressed               This Visit's Progress     Patient Stated (pt-stated)        Would like loose 20 lbs.          This is a list of Health Maintenance Items that are overdue or due now: There are no preventive care reminders to display for this patient.   Orders/Referrals Placed Today: Orders Placed This Encounter  Procedures   Flu Vaccine QUAD High Dose(Fluad)   (Contact our referral department at [phone] you have not spoken with someone about your referral appointment within the next 5 days)    Follow-up Plan Follow-up with BDonella Stade PA-C as planned Medicare wellness visit in one year.  AVS printed and given to the patient.      Health  Maintenance, Male Adopting a healthy lifestyle and getting preventive care are important in promoting health and wellness. Ask your health care provider about: The right schedule for you to have regular tests and exams. Things you can do on your own to prevent diseases and keep yourself healthy. What should I know about diet, weight, and exercise? Eat a healthy diet  Eat a diet that includes plenty of vegetables, fruits, low-fat dairy products, and lean protein. Do not eat a lot of foods that are high in solid fats, added sugars, or sodium. Maintain a healthy weight Body mass index (BMI) is a measurement that can be used to identify possible weight problems. It estimates body fat based on height and weight. Your health care provider can help determine your BMI and help you achieve or maintain a healthy weight. Get regular exercise Get regular exercise. This is one of the most important things you can do for your health. Most adults should: Exercise for at least 150 minutes each week. The exercise should increase your heart rate and make you sweat (moderate-intensity exercise). Do strengthening exercises at least twice a week. This is in addition to the moderate-intensity exercise. Spend less time sitting. Even light physical activity can be beneficial. Watch cholesterol and blood lipids Have your blood tested for lipids and cholesterol at 70years of age, then have this test every 5 years. You may need to have your cholesterol levels checked more often if: Your lipid or cholesterol levels are high. You are older than  70 years of age. You are at high risk for heart disease. What should I know about cancer screening? Many types of cancers can be detected early and may often be prevented. Depending on your health history and family history, you may need to have cancer screening at various ages. This may include screening for: Colorectal cancer. Prostate cancer. Skin cancer. Lung cancer. What  should I know about heart disease, diabetes, and high blood pressure? Blood pressure and heart disease High blood pressure causes heart disease and increases the risk of stroke. This is more likely to develop in people who have high blood pressure readings or are overweight. Talk with your health care provider about your target blood pressure readings. Have your blood pressure checked: Every 3-5 years if you are 49-51 years of age. Every year if you are 48 years old or older. If you are between the ages of 47 and 60 and are a current or former smoker, ask your health care provider if you should have a one-time screening for abdominal aortic aneurysm (AAA). Diabetes Have regular diabetes screenings. This checks your fasting blood sugar level. Have the screening done: Once every three years after age 73 if you are at a normal weight and have a low risk for diabetes. More often and at a younger age if you are overweight or have a high risk for diabetes. What should I know about preventing infection? Hepatitis B If you have a higher risk for hepatitis B, you should be screened for this virus. Talk with your health care provider to find out if you are at risk for hepatitis B infection. Hepatitis C Blood testing is recommended for: Everyone born from [birth year] through 1964. Anyone with known risk factors for hepatitis C. Sexually transmitted infections (STIs) You should be screened each year for STIs, including gonorrhea and chlamydia, if: You are sexually active and are younger than 70 years of age. You are older than 70 years of age and your health care provider tells you that you are at risk for this type of infection. Your sexual activity has changed since you were last screened, and you are at increased risk for chlamydia or gonorrhea. Ask your health care provider if you are at risk. Ask your health care provider about whether you are at high risk for HIV. Your health care provider may recommend a  prescription medicine to help prevent HIV infection. If you choose to take medicine to prevent HIV, you should first get tested for HIV. You should then be tested every 3 months for as long as you are taking the medicine. Follow these instructions at home: Alcohol use Do not drink alcohol if your health care provider tells you not to drink. If you drink alcohol: Limit how much you have to 0-2 drinks a day. Know how much alcohol is in your drink. In the [place] one drink equals one 12 oz bottle of beer (355 mL), one 5 oz glass of wine (148 mL), or one 1 oz glass of hard liquor (44 mL). Lifestyle Do not use any products that contain nicotine or tobacco. These products include cigarettes, chewing tobacco, and vaping devices, such as e-cigarettes. If you need help quitting, ask your health care provider. Do not use street drugs. Do not share needles. Ask your health care provider for help if you need support or information about quitting drugs. General instructions Schedule regular health, dental, and eye exams. Stay current with your vaccines. Tell your health care provider if: You  often feel depressed. You have ever been abused or do not feel safe at home. Summary Adopting a healthy lifestyle and getting preventive care are important in promoting health and wellness. Follow your health care provider's instructions about healthy diet, exercising, and getting tested or screened for diseases. Follow your health care provider's instructions on monitoring your cholesterol and blood pressure. This information is not intended to replace advice given to you by your health care provider. Make sure you discuss any questions you have with your health care provider. Document Revised: 07/30/2020 Document Reviewed: 07/30/2020 Elsevier Patient Education  [place].

## 2021-12-25 ENCOUNTER — Ambulatory Visit: Payer: Medicare Other | Admitting: Physician Assistant

## 2022-07-01 ENCOUNTER — Ambulatory Visit (INDEPENDENT_AMBULATORY_CARE_PROVIDER_SITE_OTHER): Payer: Medicare Other | Admitting: Physician Assistant

## 2022-07-01 ENCOUNTER — Encounter: Payer: Self-pay | Admitting: Physician Assistant

## 2022-07-01 VITALS — BP 146/88 | HR 106 | Temp 98.6°F | Ht 70.0 in | Wt 218.0 lb

## 2022-07-01 DIAGNOSIS — J40 Bronchitis, not specified as acute or chronic: Secondary | ICD-10-CM

## 2022-07-01 DIAGNOSIS — J329 Chronic sinusitis, unspecified: Secondary | ICD-10-CM

## 2022-07-01 MED ORDER — AZITHROMYCIN 250 MG PO TABS
ORAL_TABLET | ORAL | 0 refills | Status: DC
Start: 2022-07-01 — End: 2022-09-30

## 2022-07-01 MED ORDER — BENZONATATE 200 MG PO CAPS
200.0000 mg | ORAL_CAPSULE | Freq: Three times a day (TID) | ORAL | 0 refills | Status: DC | PRN
Start: 2022-07-01 — End: 2022-09-30

## 2022-07-01 NOTE — Patient Instructions (Signed)

## 2022-07-01 NOTE — Progress Notes (Unsigned)
Acute Office Visit  Subjective:     Patient ID: Stephen King, male    DOB: [birth year], 72 y.o.   MRN: [id number]  Chief Complaint  Patient presents with   Follow-up    HPI Patient is in today for cough, sinus pressure, congestion, headache for over a week. His wife had symptoms first and her symptoms resolved. His have not. No fever, chills, body aches. He is taking delsym for cough. Cough is productive. Denies any wheezing or SOB.   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   Right shoulder pain 11/04/2013   Acute bronchitis 10/19/2014   Pre-operative cardiovascular examination 10/27/2014   left knee pain 10/26/2015   Multiple joint pain 03/10/2016   Weakness 03/10/2016   Myalgia 03/10/2016   No energy 08/11/2016   Malaise 08/11/2016   Atrial flutter 07/28/2017   Unstable angina    CAD S/P percutaneous coronary angioplasty 08/14/2017   Atrial fibrillation 08/16/2017   OSA (obstructive sleep apnea) 11/17/2017   Hemorrhagic prepatellar bursitis of left knee 11/17/2017   Prepatellar bursitis of left knee 01/21/2019   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   Actinic keratosis 12/30/2019   Asbestos exposure 12/30/2019   Former smoker 12/30/2019   Seborrheic keratoses 12/30/2019   Cervical fusion syndrome 08/22/2020   Lateral epicondylitis, left elbow 08/22/2020   Resolved Ambulatory Problems    Diagnosis Date Noted   Osteoarthritis of left knee  10/26/2015   Paroxysmal atrial fibrillation 09/17/2017   Past Medical History:  Diagnosis Date   Allergy    Arthritis    Cataract    Dysrhythmia    GERD (gastroesophageal reflux disease)    Hernia, umbilical    History of hiatal hernia    History of kidney stones    Stroke 12/2009     ROS See HPI.      Objective:    BP (!) 146/88   Pulse (!) 106   Temp 98.6 F (37 C) (Oral)   Ht 5\' 10"  (1.778 m)   Wt 218 lb (98.9 kg)   SpO2 99%   BMI 31.28 kg/m  BP Readings from Last 3 Encounters:  07/01/22 (!) 146/88  12/11/21 117/68  11/05/21 100/70   Wt Readings from Last 3 Encounters:  07/01/22 218 lb (98.9 kg)  12/11/21 221 lb 1.9 oz (100.3 kg)  11/05/21 220 lb (99.8 kg)     Physical Exam Constitutional:      Appearance: Normal appearance.     Comments: Productive cough on exam.   HENT:     Head: Normocephalic.     Comments: Bilateral maxillary and frontal sinus tenderness to palpation.     Right Ear: Tympanic membrane, ear canal and external ear normal. There is no impacted cerumen.     Left Ear: Tympanic membrane, ear canal and external ear normal. There is no impacted cerumen.     Nose: Congestion present.     Mouth/Throat:     Mouth: Mucous membranes are  moist.     Pharynx: Posterior oropharyngeal erythema present. No oropharyngeal exudate.  Eyes:     Extraocular Movements: Extraocular movements intact.     Conjunctiva/sclera: Conjunctivae normal.     Pupils: Pupils are equal, round, and reactive to light.  Neck:     Vascular: No carotid bruit.  Cardiovascular:     Rate and Rhythm: Normal rate and regular rhythm.  Pulmonary:     Effort: Pulmonary effort is normal.  Musculoskeletal:     Cervical back: Normal range of motion and neck supple.  Lymphadenopathy:     Cervical: Cervical adenopathy present.  Neurological:     General: No focal deficit present.     Mental Status: He is alert and oriented to person, place, and time.  Psychiatric:        Mood  and Affect: Mood normal.          Assessment & Plan:  .[name] was seen today for follow-up.  Diagnoses and all orders for this visit:  Sinobronchitis -     azithromycin (ZITHROMAX Z-PAK) 250 MG tablet; Take 2 tablets (500 mg) on  Day 1,  followed by 1 tablet (250 mg) once daily on Days 2 through 5. -     benzonatate (TESSALON) 200 MG capsule; Take 1 capsule (200 mg total) by mouth 3 (three) times daily as needed.   Over a week of URI symptoms Start zpak and tessalon pearls Continue delsym but avoid any sudafed due to BP Stay hydrated Consider nasal saline rinse Follow up as needed or if symptoms worsening not improving   Tandy Gaw, PA-C

## 2022-07-02 ENCOUNTER — Encounter: Payer: Self-pay | Admitting: Physician Assistant

## 2022-09-15 ENCOUNTER — Encounter: Payer: Self-pay | Admitting: Cardiology

## 2022-09-17 MED ORDER — NITROGLYCERIN 0.4 MG SL SUBL: 0.4000 mg | SUBLINGUAL_TABLET | SUBLINGUAL | 1 refills | Status: AC | PRN

## 2022-09-23 ENCOUNTER — Other Ambulatory Visit: Payer: Self-pay | Admitting: Cardiology

## 2022-09-24 ENCOUNTER — Encounter: Payer: Self-pay | Admitting: Physician Assistant

## 2022-09-30 ENCOUNTER — Ambulatory Visit (INDEPENDENT_AMBULATORY_CARE_PROVIDER_SITE_OTHER): Payer: Medicare Other | Admitting: Physician Assistant

## 2022-09-30 ENCOUNTER — Encounter: Payer: Self-pay | Admitting: Physician Assistant

## 2022-09-30 VITALS — BP 122/74 | HR 59 | Ht 70.0 in | Wt 221.0 lb

## 2022-09-30 DIAGNOSIS — L600 Ingrowing nail: Secondary | ICD-10-CM

## 2022-09-30 NOTE — Patient Instructions (Signed)
Ingrown Toenail  An ingrown toenail occurs when the corner or sides of a toenail grow into the surrounding skin. This causes discomfort and pain. The big toe is most commonly affected, but any of the toes can be affected. If an ingrown toenail is not treated, it can become infected. What are the causes? This condition may be caused by: Wearing shoes that are too small or tight. An injury, such as stubbing your toe or having your toe stepped on. Improper cutting or care of your toenails. Having nail or foot abnormalities that were present from birth (congenital abnormalities), such as having a nail that is too big for your toe. What increases the risk? The following factors may make you more likely to develop ingrown toenails: Age. Nails tend to get thicker with age, so ingrown nails are more common among older people. Cutting your toenails incorrectly, such as cutting them very short or cutting them unevenly. An ingrown toenail is more likely to get infected if you have: Diabetes. Blood flow (circulation) problems. What are the signs or symptoms? Symptoms of an ingrown toenail may include: Pain, soreness, or tenderness. Redness. Swelling. Hardening of the skin that surrounds the toenail. Signs that an ingrown toenail may be infected include: Fluid or pus. Symptoms that get worse. How is this diagnosed? Ingrown toenails may be diagnosed based on: Your symptoms and medical history. A physical exam. Labs or tests. If you have fluid or blood coming from your toenail, a sample may be collected to test for the specific type of bacteria that is causing the infection. How is this treated? Treatment depends on the severity of your symptoms. You may be able to care for your toenail at home. If you have an infection, you may be prescribed antibiotic medicines. If you have fluid or pus draining from your toenail, your health care provider may drain it. If you have trouble walking, you may be  given crutches to use. If you have a severe or infected ingrown toenail, you may need a procedure to remove part or all of the nail. Follow these instructions at home: Foot care  Check your wound every day for signs of infection, or as often as told by your health care provider. Check for: More redness, swelling, or pain. More fluid or blood. Warmth. Pus or a bad smell. Do not pick at your toenail or try to remove it yourself. Soak your foot in warm, soapy water. Do this for 20 minutes, 3 times a day, or as often as told by your health care provider. This helps to keep your toe clean and your skin soft. Wear shoes that fit well and are not too tight. Your health care provider may recommend that you wear open-toed shoes while you heal. Trim your toenails regularly and carefully. Cut your toenails straight across to prevent injury to the skin at the corners of the toenail. Do not cut your nails in a curved shape. Keep your feet clean and dry to help prevent infection. General instructions Take over-the-counter and prescription medicines only as told by your health care provider. If you were prescribed an antibiotic, take it as told by your health care provider. Do not stop taking the antibiotic even if you start to feel better. If your health care provider told you to use crutches to help you move around, use them as instructed. Return to your normal activities as told by your health care provider. Ask your health care provider what activities are safe for you.   Keep all follow-up visits. This is important. Contact a health care provider if: You have more redness, swelling, pain, or other symptoms that do not improve with treatment. You have fluid, blood, or pus coming from your toenail. You have a red streak on your skin that starts at your foot and spreads up your leg. You have a fever. Summary An ingrown toenail occurs when the corner or sides of a toenail grow into the surrounding skin.  This causes discomfort and pain. The big toe is most commonly affected, but any of the toes can be affected. If an ingrown toenail is not treated, it can become infected. Fluid or pus draining from your toenail is a sign of infection. Your health care provider may need to drain it. You may be given antibiotics to treat the infection. Trimming your toenails regularly and properly can help you prevent an ingrown toenail. This information is not intended to replace advice given to you by your health care provider. Make sure you discuss any questions you have with your health care provider. Document Revised: 07/10/2020 Document Reviewed: 07/10/2020 Elsevier Patient Education  2024 [employer]  

## 2022-09-30 NOTE — Progress Notes (Signed)
Acute Office Visit  Subjective:     Patient ID: Stephen King, male    DOB: 10/13/1950, 72 y.o.   MRN: [id number]  Chief Complaint  Patient presents with   Ingrown Toe Nail    HPI Patient is in today for left recurrent ingrown toenail. It has been giving him problems for about one week now but today is better. He has been trying to "dig it out" himself. Denies any discharge, redness, warmth or swelling.   .. 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   Right shoulder pain 11/04/2013   Acute bronchitis 10/19/2014   Pre-operative cardiovascular examination 10/27/2014   left knee pain 10/26/2015   Multiple joint pain 03/10/2016   Weakness 03/10/2016   Myalgia 03/10/2016   No energy 08/11/2016   Malaise 08/11/2016   Atrial flutter (HCC) 07/28/2017   Unstable angina (HCC)    CAD S/P percutaneous coronary angioplasty 08/14/2017   Atrial fibrillation (HCC) 08/16/2017   OSA (obstructive sleep apnea) 11/17/2017   Hemorrhagic prepatellar bursitis of left knee 11/17/2017   Prepatellar bursitis of left knee 01/21/2019   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   Actinic keratosis 12/30/2019   Asbestos exposure 12/30/2019   Former smoker 12/30/2019   Seborrheic keratoses 12/30/2019   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      Objective:    BP 122/74 (BP Location: Right Arm, Patient Position: Sitting, Cuff Size: Large)   Pulse (!) 59   Ht 5\' 10"  (1.778 m)   Wt 221 lb (100.2 kg)   SpO2 98%   BMI 31.71 kg/m  BP Readings from Last 3 Encounters:  09/30/22 122/74  07/01/22 (!) 146/88  12/11/21 117/68   Wt Readings from Last 3 Encounters:  09/30/22 221 lb (100.2 kg)  07/01/22 218 lb (98.9 kg)  12/11/21 221 lb 1.9 oz (100.3 kg)      Physical Exam  Left medial great toenail ingrown with no erythema, warmth, discharge or swelling. No significant tenderness to touch. Slight appearance of the medial nail growing inward.       Assessment & Plan:  .[name] was seen today for ingrown toe nail.  Diagnoses and all orders for this visit:  Ingrown left big toenail   Not infected today Seems like resolving on its on Discussed prevention with good nail cutting technique and epson salt soaks the nail lift HO given  Follow up as needed or if  symptoms persist, discussed partial nail removal if continues to be a problem.     Tandy Gaw, PA-C

## 2022-11-24 ENCOUNTER — Other Ambulatory Visit: Payer: Self-pay | Admitting: Cardiology

## 2022-11-24 NOTE — Progress Notes (Unsigned)
Cardiology Office Note    Patient Name: Stephen King Date of Encounter: 11/26/2022  Primary Care Provider:  Jomarie Longs, PA-C Primary Cardiologist:  Donato Schultz, MD Primary Electrophysiologist: Will Jorja Loa, MD   Past Medical History    Past Medical History:  Diagnosis Date   Allergy    Arthritis    "?right knee" (08/13/2017)   Cataract    Dysrhythmia    PAF   GERD (gastroesophageal reflux disease)    Hernia, umbilical    History of hiatal hernia    History of kidney stones    Stroke [facility]) 12/2009   "has to watch where he's walking since; more of a spacial thing resulting from stroke" (08/13/2017)    History of Present Illness  Stephen King is a 72 y.o. male with a PMH of CAD s/p PCI/DES to circumflex, small jailed OM, PAF s/p AF ablation 08/2017, HTN, HLD, OSA (on CPAP) CVA 2011 who presents today for 1 year follow-up.  Mr. Surti was seen initially by Dr. Anne Fu in 2015 for management of AF.  An exercise Myoview that was low risk.  Coronary CTA was completed in 07/2017 with significant stenosis to left circumflex artery and calcium score of 19.  He underwent an LHC that showed severe stenosis and proximal circumflex treated with PTCA/DES x 1 with small caliber OM marginal branch jailed by the stent, 30% mid RCA and 20% proximal RCA lesion.  Underwent a AF ablation in 08/2017 and repeat heart cath in 05/2019 that showed patent stents.  He was last seen by Dr. Anne Fu on 11/05/2021 for follow-up.  During visit patient reported feeling well and denied any anginal symptoms.  He was taking Protonix due to GERD and metoprolol was previously stopped due to hypotension.  He denied any recent episodes of AF and was tolerating Xarelto without any issues.  During today's visit the patient reports doing well with no new cardiac complaints.  His blood pressure today is controlled at 107/70 and heart rate is 68 bpm.  He denies any episodes of arrhythmia or palpitations since follow-up.  He  is compliant with his current medications and denies any adverse reactions.  He is very active with his current work and is not following a standard exercise routine.  He does note walking frequently with his jobs and watches his caffeine and alcohol intake.  Patient denies chest pain, palpitations, dyspnea, PND, orthopnea, nausea, vomiting, dizziness, syncope, edema, weight gain, or early satiety.   Review of Systems  Please see the history of present illness.    All other systems reviewed and are otherwise negative except as noted above.  Physical Exam    Wt Readings from Last 3 Encounters:  11/26/22 221 lb (100.2 kg)  09/30/22 221 lb (100.2 kg)  07/01/22 218 lb (98.9 kg)   VS: Vitals:   11/26/22 1514  BP: 107/70  Pulse: 68  SpO2: 94%  ,Body mass index is 31.71 kg/m. GEN: Well nourished, well developed in no acute distress Neck: No JVD; No carotid bruits Pulmonary: Clear to auscultation without rales, wheezing or rhonchi  Cardiovascular: Normal rate. Regular rhythm. Normal S1. Normal S2.   Murmurs: There is no murmur.  ABDOMEN: Soft, non-tender, non-distended EXTREMITIES:  No edema; No deformity   EKG/LABS/ Recent Cardiac Studies   ECG personally reviewed by me today -sinus rhythm with PACs and rate of 68 bpm with no acute changes consistent with previous EKG.  Risk Assessment/Calculations:    CHA2DS2-VASc Score = 5  This indicates a 7.2% annual risk of stroke. The patient's score is based upon: CHF History: 0 HTN History: 1 Diabetes History: 0 Stroke History: 2 Vascular Disease History: 1 Age Score: 1 Gender Score: 0         Lab Results  Component Value Date   WBC 8.1 11/05/2021   HGB 15.0 11/05/2021   HCT 42.5 11/05/2021   MCV 92 11/05/2021   PLT 258 11/05/2021   Lab Results  Component Value Date   CREATININE 0.93 11/05/2021   BUN 11 11/05/2021   NA 141 11/05/2021   K 4.2 11/05/2021   CL 104 11/05/2021   CO2 25 11/05/2021   Lab Results   Component Value Date   CHOL 95 (L) 11/05/2021   HDL 41 11/05/2021   LDLCALC 37 11/05/2021   TRIG 86 11/05/2021   CHOLHDL 2.3 11/05/2021    Lab Results  Component Value Date   HGBA1C 5.1 08/21/2013   Assessment & Plan    1.  Coronary artery disease: -s/p PCI/DES to circumflex, small jailed OM with repeat LHC 05/2019 showing patent stents. -Today patient denies any chest pain or anginal equivalent -Continue GDMT with Lipitor 80 mg daily and as needed Nitrostat  2.  Paroxysmal AF: -Patient currently not on rate control medication with no recurrences of arrhythmia since previous visit -Patient's creatinine clearance is 107 -Continue current dose of Xarelto 20 mg daily -CHA2DS2-VASc Score = 5 [CHF History: 0, HTN History: 1, Diabetes History: 0, Stroke History: 2, Vascular Disease History: 1, Age Score: 1, Gender Score: 0].  Therefore, the patient's annual risk of stroke is 7.2 %.      3.  Essential hypertension: -Patient's blood pressure is stable today at 107/70 -He will continue to follow low-sodium diet  4.  Hyperlipidemia: -Patient's last LDL cholesterol was 37 -Continue atorvastatin 80 mg daily  5.  Obstructive sleep apnea: -Patient reports compliance with current appliance  Disposition: Follow-up with Donato Schultz, MD or APP in 12 months    Signed, Napoleon Form, Leodis Rains, NP 11/26/2022, 5:09 PM  [facility]

## 2022-11-26 ENCOUNTER — Encounter: Payer: Self-pay | Admitting: Nurse Practitioner

## 2022-11-26 ENCOUNTER — Ambulatory Visit: Payer: Medicare Other | Attending: Nurse Practitioner | Admitting: Nurse Practitioner

## 2022-11-26 VITALS — BP 107/70 | HR 68 | Ht 70.0 in | Wt 221.0 lb

## 2022-11-26 DIAGNOSIS — I4891 Unspecified atrial fibrillation: Secondary | ICD-10-CM | POA: Diagnosis not present

## 2022-11-26 DIAGNOSIS — Z9861 Coronary angioplasty status: Secondary | ICD-10-CM

## 2022-11-26 DIAGNOSIS — I10 Essential (primary) hypertension: Secondary | ICD-10-CM | POA: Diagnosis not present

## 2022-11-26 DIAGNOSIS — G4733 Obstructive sleep apnea (adult) (pediatric): Secondary | ICD-10-CM

## 2022-11-26 DIAGNOSIS — I480 Paroxysmal atrial fibrillation: Secondary | ICD-10-CM | POA: Diagnosis not present

## 2022-11-26 DIAGNOSIS — I2510 Atherosclerotic heart disease of native coronary artery without angina pectoris: Secondary | ICD-10-CM

## 2022-11-26 DIAGNOSIS — E785 Hyperlipidemia, unspecified: Secondary | ICD-10-CM | POA: Diagnosis not present

## 2022-11-26 MED ORDER — RIVAROXABAN 20 MG PO TABS: 20.0000 mg | ORAL_TABLET | Freq: Every day | ORAL | 1 refills | Status: DC

## 2022-11-26 NOTE — Patient Instructions (Signed)
 Medication Instructions:  Your physician recommends that you continue on your current medications as directed. Please refer to the Current Medication list given to you today. *If you need a refill on your cardiac medications before your next appointment, please call your pharmacy*   Lab Work: None ordered   Testing/Procedures: None ordered   Follow-Up: At [facility], you and your health needs are our priority.  As part of our continuing mission to provide you with exceptional heart care, we have created designated Provider Care Teams.  These Care Teams include your primary Cardiologist (physician) and Advanced Practice Providers (APPs -  Physician Assistants and Nurse Practitioners) who all work together to provide you with the care you need, when you need it.  We recommend signing up for the patient portal called "MyChart".  Sign up information is provided on this After Visit Summary.  MyChart is used to connect with patients for Virtual Visits (Telemedicine).  Patients are able to view lab/test results, encounter notes, upcoming appointments, etc.  Non-urgent messages can be sent to your provider as well.   To learn more about what you can do with MyChart, go to [email or web address].    Your next appointment:   12 month(s)  Provider:   Candee Furbish, MD     Other Instructions

## 2022-12-09 ENCOUNTER — Other Ambulatory Visit: Payer: Self-pay | Admitting: Cardiology

## 2022-12-09 DIAGNOSIS — I4891 Unspecified atrial fibrillation: Secondary | ICD-10-CM

## 2022-12-16 ENCOUNTER — Ambulatory Visit (INDEPENDENT_AMBULATORY_CARE_PROVIDER_SITE_OTHER): Payer: Medicare Other | Admitting: Physician Assistant

## 2022-12-16 VITALS — BP 120/64 | HR 58 | Ht 70.0 in | Wt 220.1 lb

## 2022-12-16 DIAGNOSIS — Z23 Encounter for immunization: Secondary | ICD-10-CM | POA: Diagnosis not present

## 2022-12-16 DIAGNOSIS — Z0000 Encounter for general adult medical examination without abnormal findings: Secondary | ICD-10-CM

## 2022-12-16 NOTE — Progress Notes (Signed)
MEDICARE ANNUAL WELLNESS VISIT  12/16/2022  Subjective:  Stephen King is a 72 y.o. male patient of Caleen Essex, Lonna Cobb, PA-C who had a [employer] Visit today. Dhani is Working part time and lives with their family. he has 2 children. he reports that he is socially active and does interact with friends/family regularly. he is moderately physically active and enjoys working with model trains and wood working.  Patient Care Team: Nolene Ebbs as PCP - General (Family Medicine) Jake Bathe, MD as PCP - Cardiology (Cardiology) Regan Lemming, MD as PCP - Electrophysiology (Cardiology)     12/16/2022   10:02 AM 12/11/2021    9:58 AM 10/02/2020    9:09 AM 06/20/2019    6:06 AM 12/14/2017    8:26 PM 10/27/2017    8:42 PM 09/17/2017    6:11 AM  Advanced Directives  Does Patient Have a Medical Advance Directive? Yes Yes Yes Yes Yes Yes Yes  Type of Advance Directive Living will Living will Living will Living will;Healthcare Power of Attorney Living will;Healthcare Power of Attorney Living will;Healthcare Power of [employer] Power of [place];Living will  Does patient want to make changes to medical advance directive? No - Patient declined No - Patient declined No - Patient declined No - Patient declined No - Patient declined No - Patient declined No - Patient declined  Copy of Healthcare Power of Attorney in Chart?    No - copy requested No - copy requested No - copy requested No - copy requested    Hospital Utilization Over the Past 12 Months: # of hospitalizations or ER visits: 0 # of surgeries: 0  Review of Systems    Patient reports that his overall health is unchanged when compared to last year.  Review of Systems: History obtained from chart review and the patient  All other systems negative.  Pain Assessment Pain : No/denies pain     Current Medications & Allergies (verified) Allergies as of 12/16/2022       Reactions   Feldene [piroxicam]  Hives        Medication List        Accurate as of December 16, 2022 10:27 AM. If you have any questions, ask your nurse or doctor.          atorvastatin 80 MG tablet Commonly known as: LIPITOR TAKE 1 TABLET BY MOUTH ONCE  DAILY   metoprolol tartrate 25 MG tablet Commonly known as: LOPRESSOR TAKE ONE-HALF TABLET BY  MOUTH TWICE DAILY What changed:  how much to take how to take this when to take this additional instructions   nitroGLYCERIN 0.4 MG SL tablet Commonly known as: NITROSTAT Place 1 tablet (0.4 mg total) under the tongue every 5 (five) minutes x 3 doses as needed for chest pain.   rivaroxaban 20 MG Tabs tablet Commonly known as: Xarelto Take 1 tablet (20 mg total) by mouth daily with supper.        History (reviewed): Past Medical History:  Diagnosis Date   Allergy    Arthritis    "?right knee" (08/13/2017)   Cataract    Dysrhythmia    PAF   GERD (gastroesophageal reflux disease)    Hernia, umbilical    History of hiatal hernia    History of kidney stones    Stroke [facility]) 12/2009   "has to watch where he's walking since; more of a spacial thing resulting from stroke" (08/13/2017)   Past Surgical History:  Procedure  Laterality Date   ABDOMINAL HERNIA REPAIR     "has a mesh in his abdomen"   ANTERIOR CERVICAL DECOMP/DISCECTOMY FUSION  10/2005   C6-7   ATRIAL FIBRILLATION ABLATION N/A 09/17/2017   Procedure: ATRIAL FIBRILLATION ABLATION;  Surgeon: Regan Lemming, MD;  Location: [facility];  Service: Cardiovascular;  Laterality: N/A;   BACK SURGERY     CARDIAC CATHETERIZATION  2003   CATARACT EXTRACTION W/ INTRAOCULAR LENS  IMPLANT, BILATERAL Bilateral    CORONARY ANGIOPLASTY WITH STENT PLACEMENT  08/13/2017   CORONARY STENT INTERVENTION N/A 08/13/2017   Procedure: CORONARY STENT INTERVENTION;  Surgeon: Kathleene Hazel, MD;  Location: [facility];  Service: Cardiovascular;  Laterality: N/A;   EXCISIONAL  HEMORRHOIDECTOMY     EYE SURGERY     HERNIA REPAIR     LEFT HEART CATH AND CORONARY ANGIOGRAPHY N/A 08/13/2017   Procedure: LEFT HEART CATH AND CORONARY ANGIOGRAPHY;  Surgeon: Kathleene Hazel, MD;  Location: [facility];  Service: Cardiovascular;  Laterality: N/A;   LEFT HEART CATH AND CORONARY ANGIOGRAPHY N/A 06/20/2019   Procedure: LEFT HEART CATH AND CORONARY ANGIOGRAPHY;  Surgeon: Yvonne Kendall, MD;  Location: [facility];  Service: Cardiovascular;  Laterality: N/A;   SHOULDER ARTHROSCOPY WITH ROTATOR CUFF REPAIR Right 10/30/2014   Procedure: SHOULDER ARTHROSCOPY WITH ROTATOR CUFF REPAIR;  Surgeon: Jones Broom, MD;  Location: [facility];  Service: Orthopedics;  Laterality: Right;   SHOULDER ARTHROSCOPY WITH SUBACROMIAL DECOMPRESSION Right 10/30/2014   Procedure: Right shoulder arthroscopy rotator cuff repair, subacromial decompression;  Surgeon: Jones Broom, MD;  Location: [facility];  Service: Orthopedics;  Laterality: Right;  Right shoulder arthrscopy rotator cuff repair, subacromial decompression   SPINE SURGERY     Family History  Problem Relation Age of Onset   Alcohol abuse Mother    Arrhythmia Mother    Colon cancer Father    Alcohol abuse Father    Stroke Maternal Grandfather    Social History   Socioeconomic History   Marital status: Married    Spouse name: Nicole Cella   Number of children: 2   Years of education: 13   Highest education level: Some college, no degree  Occupational History    Comment: Retired-working part-time  Tobacco Use   Smoking status: Former    Current packs/day: 0.00    Types: Cigarettes, Cigars    Start date: 03/24/1972    Quit date: 03/25/1999    Years since quitting: 23.7   Smokeless tobacco: Never  Vaping Use   Vaping status: Never Used  Substance and Sexual Activity   Alcohol use: No   Drug use: No   Sexual activity: Not Currently  Other Topics Concern   Not on file  Social  History Narrative   Lives with wife. His daughter and granddaughter are living with him temporarily. He enjoys working with model trains and wood working.   Social Determinants of Health   Financial Resource Strain: Low Risk  (12/16/2022)   Overall Financial Resource Strain (CARDIA)    Difficulty of Paying Living Expenses: Not hard at all  Food Insecurity: No Food Insecurity (12/16/2022)   Hunger Vital Sign    Worried About Running Out of Food in the Last Year: Never true    Ran Out of Food in the Last Year: Never true  Transportation Needs: No Transportation Needs (12/16/2022)   PRAPARE - Transportation    Lack of Transportation (Medical): No    Lack  of Transportation (Non-Medical): No  Physical Activity: Insufficiently Active (12/16/2022)   Exercise Vital Sign    Days of Exercise per Week: 3 days    Minutes of Exercise per Session: 40 min  Stress: Stress Concern Present (12/16/2022)   [employer] of Occupational Health - Occupational Stress Questionnaire    Feeling of Stress : To some extent  Social Connections: Moderately Integrated (12/16/2022)   Social Connection and Isolation Panel [NHANES]    Frequency of Communication with Friends and Family: Once a week    Frequency of Social Gatherings with Friends and Family: More than three times a week    Attends Religious Services: More than 4 times per year    Active Member of [employer] or Organizations: No    Attends Banker Meetings: Never    Marital Status: Married    Activities of Daily Living    12/16/2022   10:06 AM  In your present state of health, do you have any difficulty performing the following activities:  Hearing? 0  Vision? 1  Comment having some difficulty with blurred vision bilaterally  Difficulty concentrating or making decisions? 0  Walking or climbing stairs? 0  Dressing or bathing? 0  Doing errands, shopping? 0  Preparing Food and eating ? N  Using the Toilet? N  In the past six months, have  you accidently leaked urine? N  Do you have problems with loss of bowel control? N  Managing your Medications? N  Managing your Finances? N  Housekeeping or managing your Housekeeping? N    Patient Education/Literacy How often do you need to have someone help you when you read instructions, pamphlets, or other written materials from your doctor or pharmacy?: 1 - Never What is the last grade level you completed in school?: one year of college  Exercise    Diet Patient reports consuming 3 meals a day and 2 snack(s) a day Patient reports that his primary diet is: Regular Patient reports that she does have regular access to food.   Depression Screen    12/16/2022   10:04 AM 07/01/2022    1:20 PM 12/11/2021    9:50 AM 10/02/2020    9:10 AM 12/28/2019    3:04 PM 01/19/2019   10:59 AM 11/27/2017   10:39 AM  PHQ 2/9 Scores  PHQ - 2 Score 1 0 0 0 2 0 1  PHQ- 9 Score     5 2      Fall Risk    12/16/2022   10:05 AM 12/11/2021    9:50 AM 12/07/2021    6:55 PM 10/02/2020    9:09 AM 12/28/2019    3:04 PM  Fall Risk   Falls in the past year? 0 0 0 0 0  Number falls in past yr: 0 0 0 0 0  Injury with Fall? 0 0 0 0 0  Risk for fall due to : No Fall Risks No Fall Risks  No Fall Risks   Follow up Falls evaluation completed Falls evaluation completed  Falls evaluation completed Falls evaluation completed     Objective:   BP 120/64 (BP Location: Left Arm, Patient Position: Sitting, Cuff Size: Normal)   Pulse (!) 58   Ht 5\' 10"  (1.778 m)   Wt 220 lb 1.3 oz (99.8 kg)   SpO2 97%   BMI 31.58 kg/m   Last Weight  Most recent update: 12/16/2022  9:59 AM    Weight  99.8 kg (220 lb 1.3 oz)  Body mass index is 31.58 kg/m.  Hearing/Vision  Lazar did not have difficulty with hearing/understanding during the face-to-face interview Tadao did not have difficulty with his vision during the face-to-face interview Reports that he has not had a formal eye exam by an eye care professional  within the past year Reports that he has not had a formal hearing evaluation within the past year  Cognitive Function:    12/16/2022   10:09 AM 12/11/2021    9:58 AM 10/02/2020    9:15 AM 11/27/2017   10:42 AM  6CIT Screen  What Year? 0 points 0 points 0 points 0 points  What month? 0 points 0 points 0 points 0 points  What time? 0 points 0 points 0 points 0 points  Count back from 20 0 points 0 points 0 points 0 points  Months in reverse 0 points 0 points 0 points 0 points  Repeat phrase 0 points 0 points 0 points 0 points  Total Score 0 points 0 points 0 points 0 points    Normal Cognitive Function Screening: Yes (Normal:0-7, Significant for Dysfunction: >8)  Immunization & Health Maintenance Record Immunization History  Administered Date(s) Administered   Fluad Quad(high Dose 65+) 01/19/2019, 12/11/2021   Fluad Trivalent(High Dose 65+) 12/16/2022   Influenza Split 02/04/2012   Influenza,inj,Quad PF,6+ Mos 03/07/2016, 11/17/2017   Influenza-Unspecified 03/06/2017, 12/23/2020   PFIZER(Purple Top)SARS-COV-2 Vaccination 04/14/2019, 05/06/2019, 12/23/2019   PNEUMOCOCCAL CONJUGATE-20 10/02/2020   Pneumococcal Polysaccharide-23 11/27/2017   Tdap 03/26/2009, 06/30/2019   Zoster Recombinant(Shingrix) 12/23/2020, 11/10/2021    Health Maintenance  Topic Date Due   COVID-19 Vaccine (4 - 2023-24 season) 01/01/2023 (Originally 11/23/2022)   Medicare Annual Wellness (AWV)  12/16/2023   Colonoscopy  07/11/2026   DTaP/Tdap/Td (3 - Td or Tdap) 06/29/2029   Pneumonia Vaccine 50+ Years old  Completed   INFLUENZA VACCINE  Completed   Hepatitis C Screening  Completed   Zoster Vaccines- Shingrix  Completed   HPV VACCINES  Aged Out       Assessment  This is a routine wellness examination for [employer].  Health Maintenance: Due or Overdue There are no preventive care reminders to display for this patient.   Arta Bruce does not need a referral for Community Assistance: Care  Management:   no Social Work:    no Prescription Assistance:  no Nutrition/Diabetes Education:  no   Plan:  Personalized Goals  Goals Addressed               This Visit's Progress     Patient Stated (pt-stated)        Patient stated that he would like to loose 20 lbs.       Personalized Health Maintenance & Screening Recommendations  Influenza vaccine  Lung Cancer Screening Recommended: no (Low Dose CT Chest recommended if Age 38-80 years, 20 pack-year currently smoking OR have quit w/in past 15 years) Hepatitis C Screening recommended: no HIV Screening recommended: no  Advanced Directives: Written information was not given per the patient's request.  Referrals & Orders Orders Placed This Encounter  Procedures   Flu Vaccine Trivalent High Dose (Fluad)    Follow-up Plan Follow-up with Jomarie Longs, PA-C as planned Medicare wellness visit in one year.  AVS printed and given to the patient.    I have personally reviewed and noted the following in the patient's chart:   Medical and social history Use of alcohol, tobacco or illicit drugs  Current medications and supplements Functional  ability and status Nutritional status Physical activity Advanced directives List of other physicians Hospitalizations, surgeries, and ER visits in previous 12 months Vitals Screenings to include cognitive, depression, and falls Referrals and appointments  In addition, I have reviewed and discussed with patient certain preventive protocols, quality metrics, and best practice recommendations. A written personalized care plan for preventive services as well as general preventive health recommendations were provided to patient.     Modesto Charon, RN BSN  12/16/2022

## 2022-12-16 NOTE — Patient Instructions (Addendum)
MEDICARE ANNUAL WELLNESS VISIT Health Maintenance Summary and Written Plan of Care  Stephen King ,  Thank you for allowing me to perform your Medicare Annual Wellness Visit and for your ongoing commitment to your health.   Health Maintenance & Immunization History Health Maintenance  Topic Date Due   COVID-19 Vaccine (4 - 2023-24 season) 01/01/2023 (Originally 11/23/2022)   INFLUENZA VACCINE  06/22/2023 (Originally 10/23/2022)   Medicare Annual Wellness (AWV)  12/16/2023   Colonoscopy  07/11/2026   DTaP/Tdap/Td (3 - Td or Tdap) 06/29/2029   Pneumonia Vaccine 88+ Years old  Completed   Hepatitis C Screening  Completed   Zoster Vaccines- Shingrix  Completed   HPV VACCINES  Aged Out   Immunization History  Administered Date(s) Administered   Fluad Quad(high Dose 65+) 01/19/2019, 12/11/2021   Influenza Split 02/04/2012   Influenza,inj,Quad PF,6+ Mos 03/07/2016, 11/17/2017   Influenza-Unspecified 03/06/2017, 12/23/2020   PFIZER(Purple Top)SARS-COV-2 Vaccination 04/14/2019, 05/06/2019, 12/23/2019   PNEUMOCOCCAL CONJUGATE-20 10/02/2020   Pneumococcal Polysaccharide-23 11/27/2017   Tdap 03/26/2009, 06/30/2019   Zoster Recombinant(Shingrix) 12/23/2020, 11/10/2021    These are the patient goals that we discussed:  Goals Addressed               This Visit's Progress     Patient Stated (pt-stated)        Patient stated that he would like to loose 20 lbs.         This is a list of Health Maintenance Items that are overdue or due now: Influenza vaccine  Orders/Referrals Placed Today: Orders Placed This Encounter  Procedures   Flu Vaccine Trivalent High Dose (Fluad)   (Contact our referral department at [phone] if you have not spoken with someone about your referral appointment within the next 5 days)    Follow-up Plan Follow-up with Jomarie Longs, PA-C as planned Medicare wellness visit in one year.  AVS printed and given to the patient.      Health  Maintenance, Male Adopting a healthy lifestyle and getting preventive care are important in promoting health and wellness. Ask your health care provider about: The right schedule for you to have regular tests and exams. Things you can do on your own to prevent diseases and keep yourself healthy. What should I know about diet, weight, and exercise? Eat a healthy diet  Eat a diet that includes plenty of vegetables, fruits, low-fat dairy products, and lean protein. Do not eat a lot of foods that are high in solid fats, added sugars, or sodium. Maintain a healthy weight Body mass index (BMI) is a measurement that can be used to identify possible weight problems. It estimates body fat based on height and weight. Your health care provider can help determine your BMI and help you achieve or maintain a healthy weight. Get regular exercise Get regular exercise. This is one of the most important things you can do for your health. Most adults should: Exercise for at least 150 minutes each week. The exercise should increase your heart rate and make you sweat (moderate-intensity exercise). Do strengthening exercises at least twice a week. This is in addition to the moderate-intensity exercise. Spend less time sitting. Even light physical activity can be beneficial. Watch cholesterol and blood lipids Have your blood tested for lipids and cholesterol at 71 years of age, then have this test every 5 years. You may need to have your cholesterol levels checked more often if: Your lipid or cholesterol levels are high. You are older than 71 years  of age. You are at high risk for heart disease. What should I know about cancer screening? Many types of cancers can be detected early and may often be prevented. Depending on your health history and family history, you may need to have cancer screening at various ages. This may include screening for: Colorectal cancer. Prostate cancer. Skin cancer. Lung cancer. What  should I know about heart disease, diabetes, and high blood pressure? Blood pressure and heart disease High blood pressure causes heart disease and increases the risk of stroke. This is more likely to develop in people who have high blood pressure readings or are overweight. Talk with your health care provider about your target blood pressure readings. Have your blood pressure checked: Every 3-5 years if you are 8-29 years of age. Every year if you are 9 years old or older. If you are between the ages of 69 and 58 and are a current or former smoker, ask your health care provider if you should have a one-time screening for abdominal aortic aneurysm (AAA). Diabetes Have regular diabetes screenings. This checks your fasting blood sugar level. Have the screening done: Once every three years after age 68 if you are at a normal weight and have a low risk for diabetes. More often and at a younger age if you are overweight or have a high risk for diabetes. What should I know about preventing infection? Hepatitis B If you have a higher risk for hepatitis B, you should be screened for this virus. Talk with your health care provider to find out if you are at risk for hepatitis B infection. Hepatitis C Blood testing is recommended for: Everyone born from [birth year] through 1964. Anyone with known risk factors for hepatitis C. Sexually transmitted infections (STIs) You should be screened each year for STIs, including gonorrhea and chlamydia, if: You are sexually active and are younger than 71 years of age. You are older than 71 years of age and your health care provider tells you that you are at risk for this type of infection. Your sexual activity has changed since you were last screened, and you are at increased risk for chlamydia or gonorrhea. Ask your health care provider if you are at risk. Ask your health care provider about whether you are at high risk for HIV. Your health care provider may recommend a  prescription medicine to help prevent HIV infection. If you choose to take medicine to prevent HIV, you should first get tested for HIV. You should then be tested every 3 months for as long as you are taking the medicine. Follow these instructions at home: Alcohol use Do not drink alcohol if your health care provider tells you not to drink. If you drink alcohol: Limit how much you have to 0-2 drinks a day. Know how much alcohol is in your drink. In the [place] one drink equals one 12 oz bottle of beer (355 mL), one 5 oz glass of wine (148 mL), or one 1 oz glass of hard liquor (44 mL). Lifestyle Do not use any products that contain nicotine or tobacco. These products include cigarettes, chewing tobacco, and vaping devices, such as e-cigarettes. If you need help quitting, ask your health care provider. Do not use street drugs. Do not share needles. Ask your health care provider for help if you need support or information about quitting drugs. General instructions Schedule regular health, dental, and eye exams. Stay current with your vaccines. Tell your health care provider if: You often feel  depressed. You have ever been abused or do not feel safe at home. Summary Adopting a healthy lifestyle and getting preventive care are important in promoting health and wellness. Follow your health care provider's instructions about healthy diet, exercising, and getting tested or screened for diseases. Follow your health care provider's instructions on monitoring your cholesterol and blood pressure. This information is not intended to replace advice given to you by your health care provider. Make sure you discuss any questions you have with your health care provider. Document Revised: 07/30/2020 Document Reviewed: 07/30/2020 Elsevier Patient Education  2024 [employer].

## 2022-12-23 ENCOUNTER — Encounter: Payer: Medicare Other | Admitting: Physician Assistant

## 2022-12-24 ENCOUNTER — Encounter: Payer: Self-pay | Admitting: Physician Assistant

## 2022-12-24 ENCOUNTER — Ambulatory Visit (INDEPENDENT_AMBULATORY_CARE_PROVIDER_SITE_OTHER): Payer: Medicare Other | Admitting: Physician Assistant

## 2022-12-24 VITALS — BP 122/82 | HR 58 | Ht 70.0 in | Wt 219.0 lb

## 2022-12-24 DIAGNOSIS — Z7901 Long term (current) use of anticoagulants: Secondary | ICD-10-CM

## 2022-12-24 DIAGNOSIS — Z0000 Encounter for general adult medical examination without abnormal findings: Secondary | ICD-10-CM

## 2022-12-24 DIAGNOSIS — Z131 Encounter for screening for diabetes mellitus: Secondary | ICD-10-CM | POA: Diagnosis not present

## 2022-12-24 DIAGNOSIS — W57XXXA Bitten or stung by nonvenomous insect and other nonvenomous arthropods, initial encounter: Secondary | ICD-10-CM | POA: Diagnosis not present

## 2022-12-24 DIAGNOSIS — I480 Paroxysmal atrial fibrillation: Secondary | ICD-10-CM | POA: Diagnosis not present

## 2022-12-24 DIAGNOSIS — Z125 Encounter for screening for malignant neoplasm of prostate: Secondary | ICD-10-CM | POA: Diagnosis not present

## 2022-12-24 MED ORDER — CLOBETASOL PROPIONATE 0.05 % EX CREA: 1.0000 | TOPICAL_CREAM | Freq: Two times a day (BID) | CUTANEOUS | 2 refills | Status: AC

## 2022-12-24 NOTE — Patient Instructions (Signed)
Health Maintenance After Age 71 After age 71, you are at a higher risk for certain long-term diseases and infections as well as injuries from falls. Falls are a major cause of broken bones and head injuries in people who are older than age 71. Getting regular preventive care can help to keep you healthy and well. Preventive care includes getting regular testing and making lifestyle changes as recommended by your health care provider. Talk with your health care provider about: Which screenings and tests you should have. A screening is a test that checks for a disease when you have no symptoms. A diet and exercise plan that is right for you. What should I know about screenings and tests to prevent falls? Screening and testing are the best ways to find a health problem early. Early diagnosis and treatment give you the best chance of managing medical conditions that are common after age 71. Certain conditions and lifestyle choices may make you more likely to have a fall. Your health care provider may recommend: Regular vision checks. Poor vision and conditions such as cataracts can make you more likely to have a fall. If you wear glasses, make sure to get your prescription updated if your vision changes. Medicine review. Work with your health care provider to regularly review all of the medicines you are taking, including over-the-counter medicines. Ask your health care provider about any side effects that may make you more likely to have a fall. Tell your health care provider if any medicines that you take make you feel dizzy or sleepy. Strength and balance checks. Your health care provider may recommend certain tests to check your strength and balance while standing, walking, or changing positions. Foot health exam. Foot pain and numbness, as well as not wearing proper footwear, can make you more likely to have a fall. Screenings, including: Osteoporosis screening. Osteoporosis is a condition that causes  the bones to get weaker and break more easily. Blood pressure screening. Blood pressure changes and medicines to control blood pressure can make you feel dizzy. Depression screening. You may be more likely to have a fall if you have a fear of falling, feel depressed, or feel unable to do activities that you used to do. Alcohol use screening. Using too much alcohol can affect your balance and may make you more likely to have a fall. Follow these instructions at home: Lifestyle Do not drink alcohol if: Your health care provider tells you not to drink. If you drink alcohol: Limit how much you have to: 0-1 drink a day for women. 0-2 drinks a day for men. Know how much alcohol is in your drink. In the [place] one drink equals one 12 oz bottle of beer (355 mL), one 5 oz glass of wine (148 mL), or one 1 oz glass of hard liquor (44 mL). Do not use any products that contain nicotine or tobacco. These products include cigarettes, chewing tobacco, and vaping devices, such as e-cigarettes. If you need help quitting, ask your health care provider. Activity  Follow a regular exercise program to stay fit. This will help you maintain your balance. Ask your health care provider what types of exercise are appropriate for you. If you need a cane or walker, use it as recommended by your health care provider. Wear supportive shoes that have nonskid soles. Safety  Remove any tripping hazards, such as rugs, cords, and clutter. Install safety equipment such as grab bars in bathrooms and safety rails on stairs. Keep rooms and walkways   well-lit. General instructions Talk with your health care provider about your risks for falling. Tell your health care provider if: You fall. Be sure to tell your health care provider about all falls, even ones that seem minor. You feel dizzy, tiredness (fatigue), or off-balance. Take over-the-counter and prescription medicines only as told by your health care provider. These include  supplements. Eat a healthy diet and maintain a healthy weight. A healthy diet includes low-fat dairy products, low-fat (lean) meats, and fiber from whole grains, beans, and lots of fruits and vegetables. Stay current with your vaccines. Schedule regular health, dental, and eye exams. Summary Having a healthy lifestyle and getting preventive care can help to protect your health and wellness after age 71. Screening and testing are the best way to find a health problem early and help you avoid having a fall. Early diagnosis and treatment give you the best chance for managing medical conditions that are more common for people who are older than age 71. Falls are a major cause of broken bones and head injuries in people who are older than age 71. Take precautions to prevent a fall at home. Work with your health care provider to learn what changes you can make to improve your health and wellness and to prevent falls. This information is not intended to replace advice given to you by your health care provider. Make sure you discuss any questions you have with your health care provider. Document Revised: 07/30/2020 Document Reviewed: 07/30/2020 Elsevier Patient Education  2024 [employer]vier Inc.  

## 2022-12-24 NOTE — Progress Notes (Unsigned)
Complete physical exam  Patient: Stephen King   DOB: 1950-05-08   72 y.o. Male  MRN: [id number]  Subjective:    No chief complaint on file.   Stephen King is a 72 y.o. male who presents today for a complete physical exam. He reports consuming a {diet types:17450} diet. {types:19826} He generally feels {DESC; WELL/FAIRLY WELL/POORLY:18703}. He reports sleeping {DESC; WELL/FAIRLY WELL/POORLY:18703}. He {does/does not:200015} have additional problems to discuss today.    Most recent fall risk assessment:    12/16/2022   10:05 AM  Fall Risk   Falls in the past year? 0  Number falls in past yr: 0  Injury with Fall? 0  Risk for fall due to : No Fall Risks  Follow up Falls evaluation completed     Most recent depression screenings:    12/16/2022   10:04 AM 07/01/2022    1:20 PM  PHQ 2/9 Scores  PHQ - 2 Score 1 0    {VISON DENTAL STD PSA (Optional):27386}  {History (Optional):23778}  Patient Care Team: Nolene Ebbs as PCP - General (Family Medicine) Jake Bathe, MD as PCP - Cardiology (Cardiology) Regan Lemming, MD as PCP - Electrophysiology (Cardiology)   Outpatient Medications Prior to Visit  Medication Sig   atorvastatin (LIPITOR) 80 MG tablet TAKE 1 TABLET BY MOUTH ONCE  DAILY   metoprolol tartrate (LOPRESSOR) 25 MG tablet TAKE ONE-HALF TABLET BY  MOUTH TWICE DAILY (Patient taking differently: prn)   nitroGLYCERIN (NITROSTAT) 0.4 MG SL tablet Place 1 tablet (0.4 mg total) under the tongue every 5 (five) minutes x 3 doses as needed for chest pain.   rivaroxaban (XARELTO) 20 MG TABS tablet Take 1 tablet (20 mg total) by mouth daily with supper.   No facility-administered medications prior to visit.    ROS        Objective:     There were no vitals taken for this visit. {Vitals History (Optional):23777}  Physical Exam   No results found for any visits on 12/24/22. {Show previous labs (optional):23779}    Assessment & Plan:    Routine  Health Maintenance and Physical Exam  Immunization History  Administered Date(s) Administered   Fluad Quad(high Dose 65+) 01/19/2019, 12/11/2021   Fluad Trivalent(High Dose 65+) 12/16/2022   Influenza Split 02/04/2012   Influenza,inj,Quad PF,6+ Mos 03/07/2016, 11/17/2017   Influenza-Unspecified 03/06/2017, 12/23/2020   PFIZER(Purple Top)SARS-COV-2 Vaccination 04/14/2019, 05/06/2019, 12/23/2019   PNEUMOCOCCAL CONJUGATE-20 10/02/2020   Pneumococcal Polysaccharide-23 11/27/2017   Tdap 03/26/2009, 06/30/2019   Zoster Recombinant(Shingrix) 12/23/2020, 11/10/2021    Health Maintenance  Topic Date Due   COVID-19 Vaccine (4 - 2023-24 season) 01/01/2023 (Originally 11/23/2022)   Medicare Annual Wellness (AWV)  12/16/2023   Colonoscopy  07/11/2026   DTaP/Tdap/Td (3 - Td or Tdap) 06/29/2029   Pneumonia Vaccine 19+ Years old  Completed   INFLUENZA VACCINE  Completed   Hepatitis C Screening  Completed   Zoster Vaccines- Shingrix  Completed   HPV VACCINES  Aged Out    Discussed health benefits of physical activity, and encouraged him to engage in regular exercise appropriate for his age and condition.  Problem List Items Addressed This Visit       Unprioritized   PAF (paroxysmal atrial fibrillation) (HCC)   Chronic anticoagulation   Other Visit Diagnoses     Routine adult health maintenance    -  Primary   Relevant Orders   TSH   CBC w/Diff/Platelet   Lipid panel  PSA   CMP14+EGFR   Prostate cancer screening       Relevant Orders   PSA   Screening for diabetes mellitus       Relevant Orders   CMP14+EGFR   Bug bite, initial encounter          No follow-ups on file.     Tandy Gaw, PA-C

## 2022-12-25 ENCOUNTER — Other Ambulatory Visit: Payer: Self-pay | Admitting: Physician Assistant

## 2022-12-25 ENCOUNTER — Encounter: Payer: Self-pay | Admitting: Physician Assistant

## 2022-12-25 LAB — CBC WITH DIFFERENTIAL/PLATELET
Basophils Absolute: 0 10*3/uL (ref 0.0–0.2)
Basos: 0 %
EOS (ABSOLUTE): 0.1 10*3/uL (ref 0.0–0.4)
Eos: 2 %
Hematocrit: 45.7 % (ref 37.5–51.0)
Hemoglobin: 15.6 g/dL (ref 13.0–17.7)
Immature Grans (Abs): 0 10*3/uL (ref 0.0–0.1)
Immature Granulocytes: 0 %
Lymphocytes Absolute: 1.7 10*3/uL (ref 0.7–3.1)
Lymphs: 24 %
MCH: 32.6 pg (ref 26.6–33.0)
MCHC: 34.1 g/dL (ref 31.5–35.7)
MCV: 95 fL (ref 79–97)
Monocytes Absolute: 0.5 10*3/uL (ref 0.1–0.9)
Monocytes: 7 %
Neutrophils Absolute: 4.7 10*3/uL (ref 1.4–7.0)
Neutrophils: 67 %
Platelets: 226 10*3/uL (ref 150–450)
RBC: 4.79 x10E6/uL (ref 4.14–5.80)
RDW: 11.9 % (ref 11.6–15.4)
WBC: 7.1 10*3/uL (ref 3.4–10.8)

## 2022-12-25 LAB — LIPID PANEL
Chol/HDL Ratio: 2.4 {ratio} (ref 0.0–5.0)
Cholesterol, Total: 104 mg/dL (ref 100–199)
HDL: 44 mg/dL (ref >39)
LDL Chol Calc (NIH): 46 mg/dL (ref 0–99)
Triglycerides: 64 mg/dL (ref 0–149)
VLDL Cholesterol Cal: 14 mg/dL (ref 5–40)

## 2022-12-25 LAB — TSH: TSH: 1.5 u[IU]/mL (ref 0.450–4.500)

## 2022-12-25 LAB — CMP14+EGFR
ALT: 22 [IU]/L (ref 0–44)
AST: 19 [IU]/L (ref 0–40)
Albumin: 4 g/dL (ref 3.8–4.8)
Alkaline Phosphatase: 82 [IU]/L (ref 44–121)
BUN/Creatinine Ratio: 9 — ABNORMAL LOW (ref 10–24)
BUN: 9 mg/dL (ref 8–27)
Bilirubin Total: 1.1 mg/dL (ref 0.0–1.2)
CO2: 24 mmol/L (ref 20–29)
Calcium: 8.8 mg/dL (ref 8.6–10.2)
Chloride: 105 mmol/L (ref 96–106)
Creatinine, Ser: 0.99 mg/dL (ref 0.76–1.27)
Globulin, Total: 2.2 g/dL (ref 1.5–4.5)
Glucose: 95 mg/dL (ref 70–99)
Potassium: 4.4 mmol/L (ref 3.5–5.2)
Sodium: 143 mmol/L (ref 134–144)
Total Protein: 6.2 g/dL (ref 6.0–8.5)
eGFR: 81 mL/min/{1.73_m2} (ref >59)

## 2022-12-25 LAB — PSA: Prostate Specific Ag, Serum: 0.9 ng/mL (ref 0.0–4.0)

## 2022-12-25 MED ORDER — ATORVASTATIN CALCIUM 80 MG PO TABS: 80.0000 mg | ORAL_TABLET | Freq: Every day | ORAL | 3 refills | Status: DC

## 2022-12-25 NOTE — Progress Notes (Signed)
Ala,   Thyroid looks great.  Hemoglobin and WBC look great.  Cholesterol looks great.  PSA normal.

## 2022-12-29 ENCOUNTER — Ambulatory Visit (INDEPENDENT_AMBULATORY_CARE_PROVIDER_SITE_OTHER): Payer: Medicare Other | Admitting: Physician Assistant

## 2022-12-29 ENCOUNTER — Encounter: Payer: Self-pay | Admitting: Physician Assistant

## 2022-12-29 VITALS — BP 109/64 | HR 65 | Ht 70.0 in | Wt 219.2 lb

## 2022-12-29 DIAGNOSIS — L820 Inflamed seborrheic keratosis: Secondary | ICD-10-CM

## 2022-12-29 DIAGNOSIS — L918 Other hypertrophic disorders of the skin: Secondary | ICD-10-CM | POA: Diagnosis not present

## 2022-12-29 DIAGNOSIS — Z6379 Other stressful life events affecting family and household: Secondary | ICD-10-CM

## 2022-12-29 DIAGNOSIS — D492 Neoplasm of unspecified behavior of bone, soft tissue, and skin: Secondary | ICD-10-CM

## 2022-12-29 NOTE — Progress Notes (Signed)
Established Patient Office Visit  Subjective   Patient ID: Stephen King, male    DOB: Aug 20, 1950  Age: 72 y.o. MRN: [id number]  Chief Complaint  Patient presents with   lesions check    One lesion R side mid back ,  skin tag under right axillae,  and lesion l thigh that has changed in texture.    wanting to continues discussion regarding psychiatry referr    HPI Pt is a 72 yo male who presents to the clinic to have some skin lesions looked at.   Pt has thought more about his family stresses and would like to talk to someone. He had a hard weekend with his wife and her memory changes and his daughter that lives with them but does not help financially.  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   Right shoulder pain 11/04/2013   Acute bronchitis 10/19/2014   Pre-operative cardiovascular examination 10/27/2014   left knee pain 10/26/2015   Multiple joint pain 03/10/2016   Weakness 03/10/2016   Myalgia 03/10/2016   No energy 08/11/2016   Malaise 08/11/2016   Atrial flutter (HCC) 07/28/2017   Unstable angina (HCC)    CAD S/P percutaneous coronary angioplasty 08/14/2017   Atrial fibrillation (HCC) 08/16/2017   OSA (obstructive sleep apnea) 11/17/2017   Hemorrhagic prepatellar bursitis of left knee 11/17/2017   Prepatellar bursitis of left knee 01/21/2019   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   Actinic keratosis 12/30/2019   Asbestos exposure 12/30/2019    Former smoker 12/30/2019   Seborrheic keratoses 12/30/2019   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    Objective:     BP 109/64   Pulse 65   Ht 5\' 10"  (1.778 m)   Wt 219 lb 4 oz (99.5 kg)   SpO2 95%   BMI 31.46 kg/m  BP Readings from Last 3 Encounters:  12/29/22 109/64  12/24/22 122/82  12/16/22 120/64   Wt Readings from Last 3 Encounters:  12/29/22 219 lb 4 oz (99.5 kg)  12/24/22 219 lb (99.3 kg)  12/16/22 220 lb 1.3 oz (99.8 kg)  ..    12/16/2022   10:04 AM 07/01/2022    1:20 PM 12/11/2021    9:50 AM 10/02/2020    9:10 AM 12/28/2019    3:04 PM  Depression screen PHQ 2/9  Decreased Interest 0 0 0 0 1  Down, Depressed, Hopeless 1  0 0 0 1  PHQ - 2 Score 1 0 0 0 2  Altered sleeping     1  Tired, decreased energy     1  Change in appetite     1  Feeling bad or failure about yourself      0  Trouble concentrating     0  Moving slowly or fidgety/restless     0  Suicidal thoughts     0  PHQ-9 Score     5  Difficult doing work/chores     Not difficult at all        Skin Tag Removal Procedure Note Diagnosis: normal skin tags Location: right axilla and flank Informed Consent: Discussed risks (permanent scarring, infection, pain, bleeding, bruising, redness, and recurrence of the lesion) and benefits of the procedure, as well as the alternatives. He is aware that skin tags are benign lesions, and their removal is often not considered medically necessary. Informed consent was obtained. Preparation:  The area was prepared in a standard fashion. Anesthesia: not required Procedure Details: Iris scissors were used to perform sharp removal. Aluminum chloride was applied for hemostasis. Ointment and bandage were applied where needed. The patient tolerated the procedure well. Total number of lesions treated: 5 Plan: The patient was instructed on post-op care. Recommend OTC analgesia as needed for pain.  Cryotherapy Procedure Note  Pre-operative Diagnosis: inflamed seborrheic keratosis  Post-operative Diagnosis: same  Locations: right mid lateral back and left inner upper thigh  Indications: irritating  Procedure Details  History of allergy to iodine: no. Pacemaker? no.  Patient informed of risks (permanent scarring, infection, light or dark discoloration, bleeding, infection, weakness, numbness and recurrence of the lesion) and benefits of the procedure and verbal informed consent obtained.  The areas are treated with liquid nitrogen therapy, frozen until ice ball extended 2 mm beyond lesion, allowed to thaw, and treated again. The patient tolerated procedure well.  The patient was instructed on post-op care, warned that there may be blister formation, redness and pain. Recommend OTC analgesia as needed for pain.  Condition: Stable  Complications: bleeding.  Plan: 1. Instructed to keep the area dry and covered for 24-48h and clean thereafter. 2. Warning signs of infection were reviewed.   3. Recommended that the patient use OTC acetaminophen as needed for pain.          Assessment & Plan:  .[name] was seen today for lesions check and wanting to continues discussion regarding psychiatry referr.  Diagnoses and all orders for this visit:  Skin tags, multiple acquired  Seborrheic keratoses, inflamed  Stressful life events affecting family and household -     Ambulatory referral to Psychology   Skin tags clipped and SK's frozen.  Referral placed for counseling with  Tasia Catchings at our office.    Tandy Gaw, PA-C

## 2022-12-29 NOTE — Patient Instructions (Signed)
Seborrheic Keratosis A seborrheic keratosis is a common, noncancerous (benign) skin growth. These growths are velvety, waxy, or rough spots that appear on the skin. They are often tan, brown, or black. The skin growths can be flat or raised and may be scaly. What are the causes? The cause of this condition is not known. What increases the risk? You are more likely to develop this condition if you: Have a family history of seborrheic keratosis. Are 11 years old or older. Are pregnant. Have had estrogen replacement therapy. What are the signs or symptoms? Symptoms of this condition include growths on the face, chest, shoulders, back, or other areas. These growths: Are usually painless, but may become irritated and itchy. Can be tan, yellow, brown, black, or other colors. Are slightly raised or have a flat surface. Are sometimes rough or wart-like in texture. Are often velvety or waxy on the surface. Are round or oval-shaped. Often occur in groups, but may occur as a single growth. How is this diagnosed? This condition is diagnosed with a medical history and physical exam. A sample of the growth may be tested (skin biopsy). You may also need to see a skin specialist (dermatologist). How is this treated? Treatment is not usually needed for this condition unless the growths are irritated or bleed often. You may also choose to have the growths removed if you do not like their appearance. Growth removal may include a procedure in which: Liquid nitrogen is applied to "freeze" off the growth (cryosurgery). This is the most common procedure. The growth is burned off with electricity (electrocautery). The growth is removed by scraping (curettage). Follow these instructions at home: Watch your growth or growths for any changes. Do not scratch or pick at the growth or growths. This can cause them to become irritated or infected. Contact a health care provider if: You suddenly have many new  growths. Your growth bleeds, itches, or hurts. Your growth suddenly becomes larger or changes color. Summary A seborrheic keratosis is a common, noncancerous skin growth. Treatment is not usually needed for this condition unless the growths are irritated or bleed often. Watch your growth or growths for any changes. Contact a health care provider if you suddenly have many new growths or your growth suddenly becomes larger or changes color. This information is not intended to replace advice given to you by your health care provider. Make sure you discuss any questions you have with your health care provider. Document Revised: 05/24/2021 Document Reviewed: 05/24/2021 Elsevier Patient Education  2024 Else[employer] Inc.  Skin Tag, Adult A skin tag (acrochordon) is a soft, extra growth of skin. Most skin tags are skin-colored and rarely bigger than a pencil eraser. They often form in areas where there is frequent rubbing, or friction, on the skin. This may be where there are folds in the skin, such as: The eyelids. The neck. The armpits. The groin. Skin tags are not dangerous, and they do not spread from person to person (are not contagious). You may have one skin tag or many. Skin tags do not need treatment. However, your health care provider may recommend removing a skin tag if it: Gets irritated from clothing or jewelry. Bleeds. Is visible and unsightly. What are the causes? This condition is linked to: Increasing age. Pregnancy. Diabetes. Obesity. What are the signs or symptoms? Skin tags usually do not cause symptoms unless they get irritated by items touching your skin, such as clothing or jewelry. When this happens, you may have pain,  itching, or bleeding. How is this diagnosed? This condition is diagnosed with an evaluation from your health care provider. No testing is needed for diagnosis. How is this treated? Treatment for this condition depends on whether you have symptoms. Your  health care provider may also remove your skin tag if it is visible or if you do not like the way it looks. A skin tag can be removed by a health care provider with: A simple surgical procedure using scissors. A procedure that involves freezing your skin tag with a gas in liquid form (liquid nitrogen). A procedure that uses heat to destroy your skin tag (electrodessication). Follow these instructions at home: Watch for any changes in your skin tag. A normal skin tag does not require any other special care at home. Take over-the-counter and prescription medicines only as told by your health care provider. Keep all follow-up visits. Contact a health care provider if: You have a skin tag that: Becomes painful. Changes color. Bleeds. Swells. Summary Skin tags are soft, extra growths of skin found in areas of frequent rubbing or friction. Skin tags usually do not cause symptoms. If symptoms occur, you may have pain, itching, or bleeding. Your health care provider may remove your skin tag if it causes symptoms or if you do not like the way it looks. This information is not intended to replace advice given to you by your health care provider. Make sure you discuss any questions you have with your health care provider. Document Revised: 04/24/2021 Document Reviewed: 04/24/2021 Elsevier Patient Education  2024 [employer].

## 2022-12-30 DIAGNOSIS — D492 Neoplasm of unspecified behavior of bone, soft tissue, and skin: Secondary | ICD-10-CM | POA: Insufficient documentation

## 2022-12-30 DIAGNOSIS — L820 Inflamed seborrheic keratosis: Secondary | ICD-10-CM | POA: Insufficient documentation

## 2022-12-30 DIAGNOSIS — L918 Other hypertrophic disorders of the skin: Secondary | ICD-10-CM | POA: Insufficient documentation

## 2022-12-30 DIAGNOSIS — Z6379 Other stressful life events affecting family and household: Secondary | ICD-10-CM | POA: Insufficient documentation

## 2023-01-26 ENCOUNTER — Other Ambulatory Visit: Payer: Self-pay | Admitting: Cardiology

## 2023-01-27 ENCOUNTER — Other Ambulatory Visit: Payer: Self-pay | Admitting: Cardiology

## 2023-01-27 DIAGNOSIS — I4891 Unspecified atrial fibrillation: Secondary | ICD-10-CM

## 2023-01-28 NOTE — Telephone Encounter (Signed)
Prescription refill request for Xarelto received.  Indication: PAF Last office visit: 11/26/22  Stephen Coke NP Weight: 100.2kg Age: 72 Scr: 0.99 on 12/24/22  Epic CrCl: 96.99  Based on above findings Xarelto 20mg  daily is the appropriate dose.  Refill approved.

## 2023-02-02 ENCOUNTER — Ambulatory Visit: Payer: Medicare Other | Admitting: Psychology

## 2023-02-02 DIAGNOSIS — F4323 Adjustment disorder with mixed anxiety and depressed mood: Secondary | ICD-10-CM
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
## 2023-02-25 IMAGING — MR MR CERVICAL SPINE W/O CM
4 of 5 series · 20 of 48 positions shown · non-contrast
Comparison: Cervical spine radiographs 07/25/2020. Brain MRI
02/06/2012.

CLINICAL DATA: 70-year-old male with left C8 radiculitis. History
of prior C5 through C7 ACDF. Neck pain and left arm radiculopathy
for 1 month.

EXAM:
MRI CERVICAL SPINE WITHOUT CONTRAST
TECHNIQUE: Multiplanar, multisequence MR imaging of the cervical spine was
performed. No intravenous contrast was administered.

[Series 2: T2 · sagittal · 3.0mm · 0.69mm/px · 6 of 13 slices shown (1 of 2)]
[im 1/13]
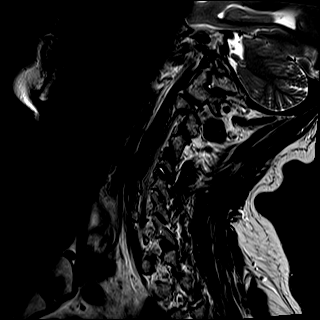
[im 3/13]
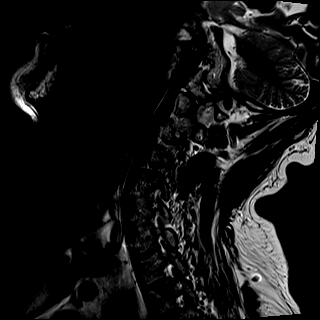
[im 5/13]
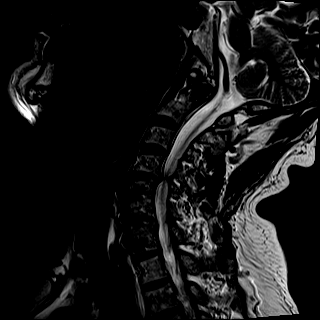
[im 8/13]
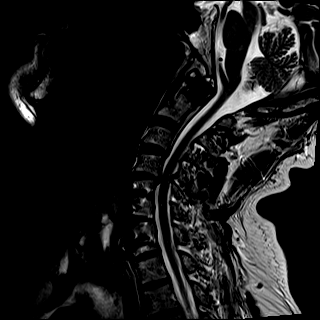
[im 10/13]
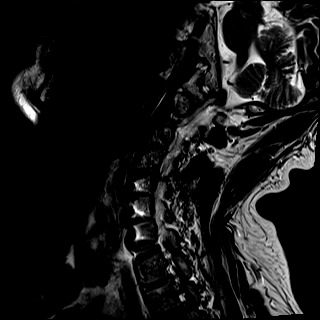
[im 13/13]
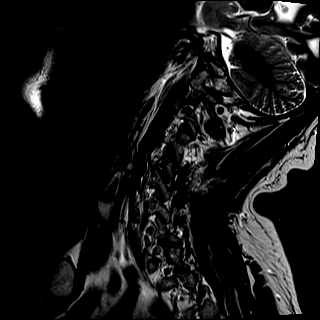

[Series 3: T1 · sagittal · 3.0mm · 0.86mm/px · 3 of 13 slices shown]
[im 3/13]
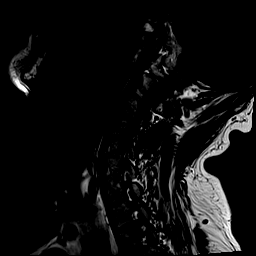
[im 8/13]
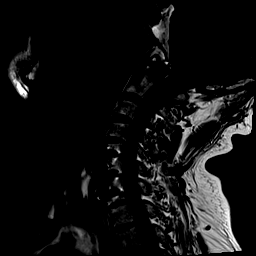
[im 13/13]
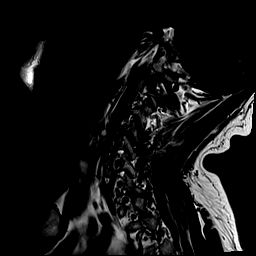

[Series 4: STIR · sagittal · 3.0mm · 0.43mm/px · 3 of 13 slices shown]
[im 3/13]
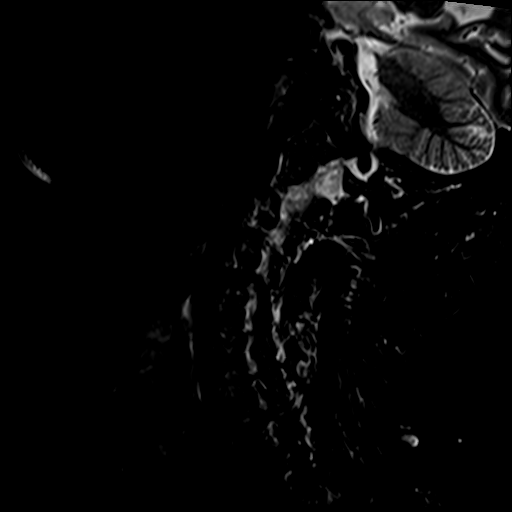
[im 8/13]
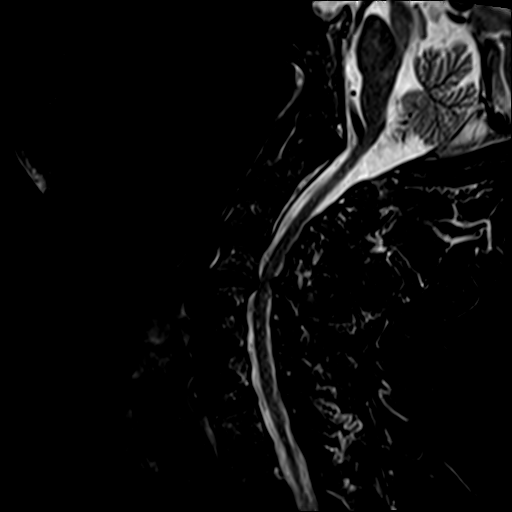
[im 13/13]
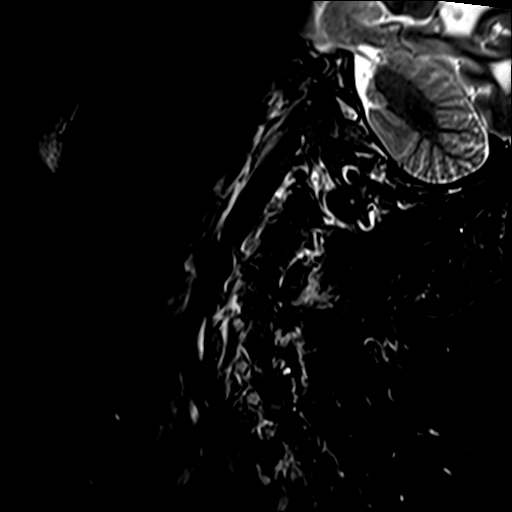

[Series 5: T2 · axial · 3.0mm · 0.28mm/px · z∈[-87,+6]mm · 8 of 32 slices shown (2 of 2)]
[im 1/32]
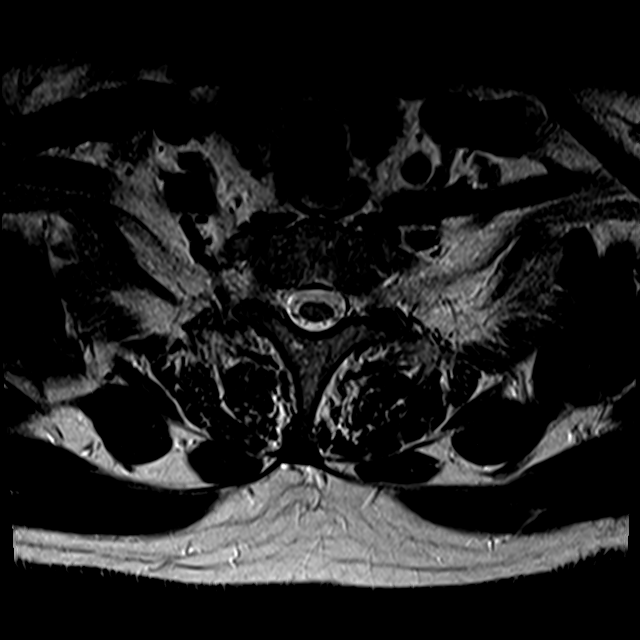
[im 5/32]
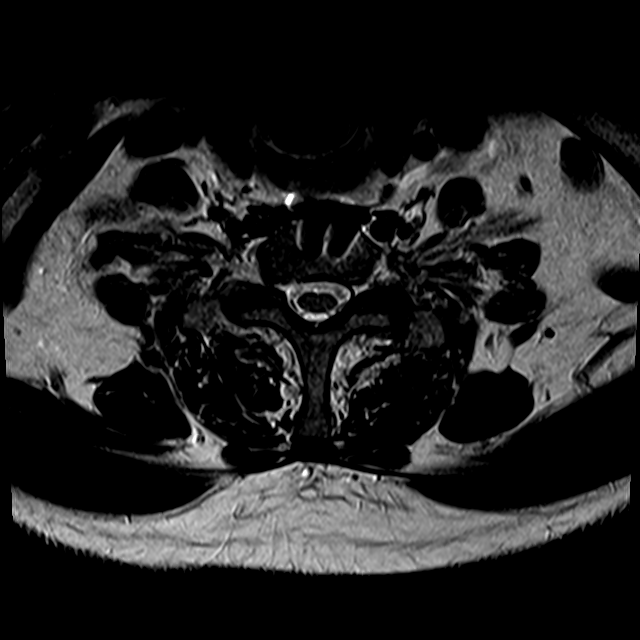
[im 9/32]
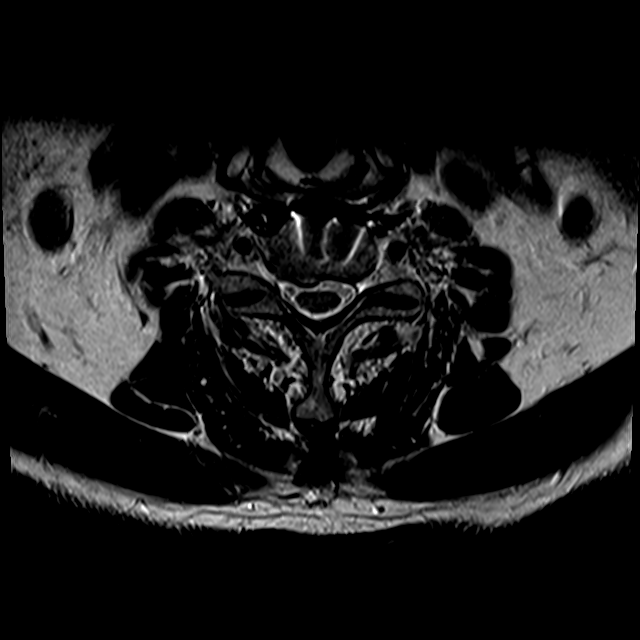
[im 14/32]
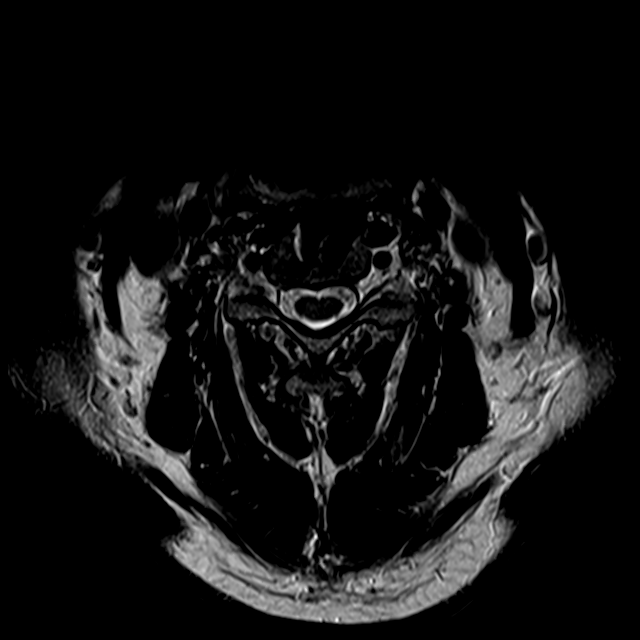
[im 16/32]
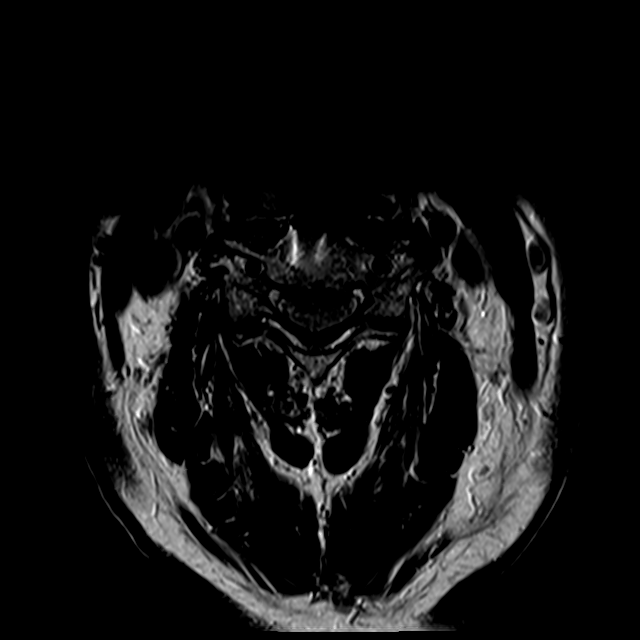
[im 18/32]
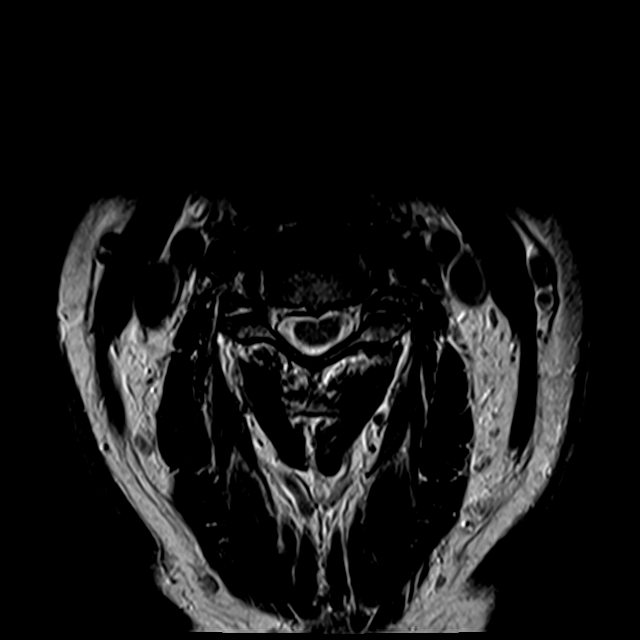
[im 23/32]
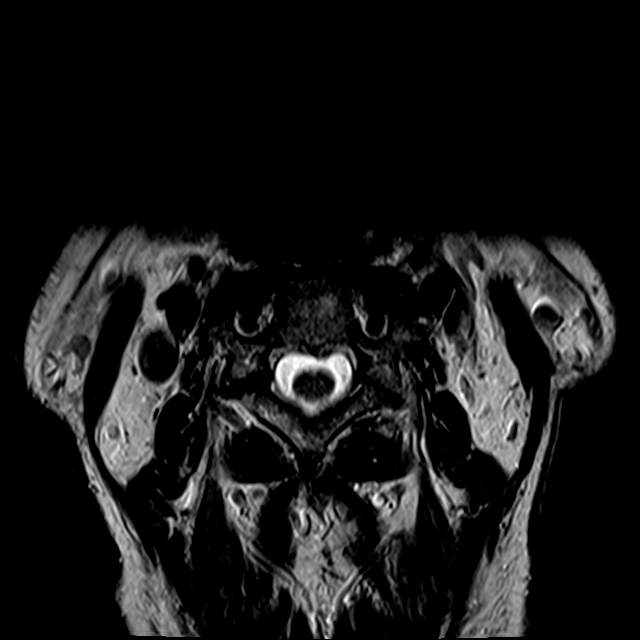
[im 27/32]
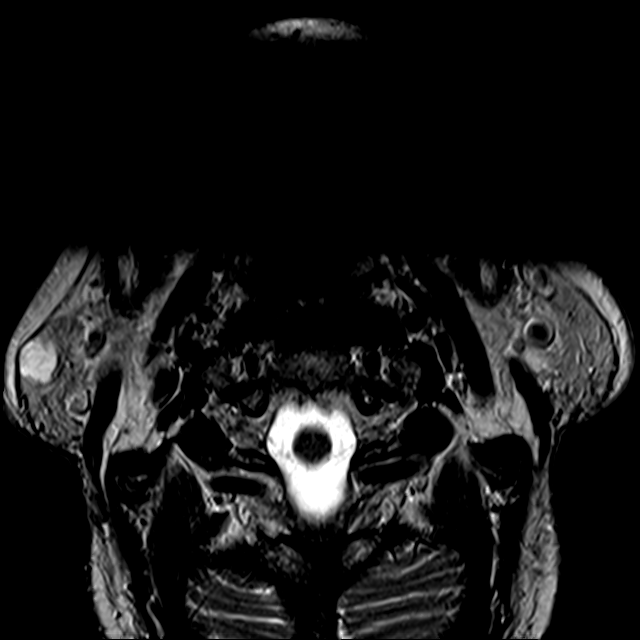

[20 of 48 positions shown; findings below may reference images not displayed]

FINDINGS: Alignment: Preserved cervical lordosis. There is subtle
retrolisthesis of C4 on C5.

Vertebrae: Mild ACDF hardware susceptibility artifact at C5, C6, and
C7. No marrow edema or evidence of acute osseous abnormality. Normal
background bone marrow signal.

Cord: There is a small area of ventral left hemicord myelomalacia
suspected at the C5 level (series 5, image 21). No other abnormal
spinal cord signal identified, including at the C4-C5 level despite
degenerative cord mass effect there (detailed below).

Posterior Fossa, vertebral arteries, paraspinal tissues:
Cervicomedullary junction is within normal limits. Negative visible
posterior fossa. Preserved major vascular flow voids in the neck. A
small round 12 mm T2 hyperintense nodule in the inferior right
parotid gland on series 5, image 6 was 11 mm in 0602, consistent
with benign etiology. Otherwise negative visible neck soft tissues.

Disc levels:

C2-C3:  Mild facet hypertrophy. No stenosis.

C3-C4: Small left paracentral disc protrusion (series 6, image 12).
No spinal stenosis. Mild facet hypertrophy. Mild C4 neural foraminal
stenosis greater on the right.

C4-C5: Subtle retrolisthesis with disc space loss. Circumferential
disc bulge and endplate spurring eccentric to the right. Mild to
moderate ligament flavum hypertrophy. Mild facet hypertrophy. Mild
spinal stenosis and spinal cord mass effect (series 2, image 7).
Moderate to severe C5 neural foraminal stenosis greater on the
right.

C5-C6:  Prior ACDF with solid appearing arthrodesis and no stenosis.

C6-C7: Prior ACDF with solid appearing arthrodesis. Residual
endplate spurring on the right. No significant stenosis.

C7-T1: Mild disc bulging and endplate spurring mostly affecting the
neural foramina. Superimposed mild to moderate facet hypertrophy. No
spinal stenosis. There is mild left and mild to moderate right C8
neural foraminal stenosis.

T1-T2: Minimal disc bulging. Mild facet hypertrophy. No convincing
stenosis.
IMPRESSION: 1. Prior ACDF at C5-C6 and C6-C7 with evidence of solid arthrodesis
and no adverse features aside from a small area of left hemicord
myelomalacia at C5.
2. Adjacent segment disease at C4-C5 with subtle retrolisthesis,
multifactorial mild spinal stenosis, mild spinal cord mass effect,
and moderate to severe C5 neural foraminal stenosis greater on the
right. No cord signal abnormality at this level.
3. Comparatively mild adjacent segment disease at C7-T1 with mild
left and up to moderate right C8 neural foraminal stenosis.

## 2023-03-10 ENCOUNTER — Ambulatory Visit (INDEPENDENT_AMBULATORY_CARE_PROVIDER_SITE_OTHER): Payer: Medicare Other | Admitting: Psychology

## 2023-03-10 DIAGNOSIS — F4323 Adjustment disorder with mixed anxiety and depressed mood: Secondary | ICD-10-CM | POA: Diagnosis not present

## 2023-03-10 NOTE — Progress Notes (Signed)
Stephen King  Patient ID: Stephen King, MRN: [id number]    Date: 03/10/23  Time Spent: 9:04  am - 9:59 am : 55 Minutes  Treatment Type: Individual Therapy.  Reported Symptoms: depression and anxiety.   Mental Status Exam: Appearance:  Casual     Behavior: Appropriate  Motor: Normal  Speech/Language:  Clear and Coherent  Affect: Congruent  Mood: dysthymic  Thought process: normal  Thought content:   WNL  Sensory/Perceptual disturbances:   WNL  Orientation: oriented to person, place, time/date, and situation  Attention: Good  Concentration: Good  Memory: WNL  Fund of knowledge:  Good  Insight:   Good  Judgment:  Good  Impulse Control: Good   Risk Assessment: Danger to Self:  No Self-injurious Behavior: No Danger to Others: No Duty to Warn:no Physical Aggression / Violence:No  Access to Firearms a concern: No  Gang Involvement:No   In case of a mental health emergency:  61 - confidential suicide hotline. [facility]):        [address], [place] [address]       [phone] 3.   911  4.   Visiting Nearest ED.    Subjective:   Stephen King participated in the session, in person in the office with the therapist, and consented to treatment Stephen King reviewed the events of the past week. We reviewed numerous treatment approaches including CBT, BA, Problem Solving, and Solution focused therapy. Psych-education regarding the Hanley's diagnosis of Adjustment disorder with mixed anxiety and depressed mood was provided during the session. We discussed Stephen King's goals treatment goals which include process past events, identify, set, and maintain boundaries,  address relationship stressors in relation to boundaries with daughter, attend joint sessions, bolster coping skills, managing symptoms proactively, focus on self-care. Stephen King provided verbal approval of the treatment  plan.   Stephen King noted daughter's partner was a "scum bag", Stephen King's father.  Daughter stated "she tried to hang herself" after discovering that her daughter's father was unfaithful. Daughter was committed and had to be returned to someone's home and moved in with Stephen King and wife. Daughter sold home pennies on the dollar, which resulted in frustration for Stephen King who helped financially with purchasing the home.  He noted a need for his daughter to address her mood, finances, and be self-sufficient.   Interventions: Psycho-education & Goal Setting.   Diagnosis:   Adjustment disorder with mixed anxiety and depressed mood  Psychiatric Treatment: No , na  Treatment Plan:  Client Abilities/Strengths Stephen King is intelligent, self-aware, and motivated for change.   Support System: Friends.   Client Treatment Preferences OPT  Client Statement of Needs Stephen King would like to process past events, identify, set, and maintain boundaries,  address relationship stressors in relation to boundaries with daughter, attend joint sessions, bolster coping skills, managing symptoms proactively, focus on self-care.   Treatment Level Weekly  Symptoms  Anxiety: Feeling anxious, difficulty managing worry, worrying too much about different things, trouble relaxing, restlessness, irritability, feeling afraid as if something awful might happen.    (Status: maintained) Depression: Loss of interest, feeling down, lethargy, feeling bad about self, difficult concentrating, psychomotor retardation, difficulty falling asleep.    (Status: maintained)  Goals:   Jedd experiences symptoms of depression and anxiety.   Treatment plan signed and available on s-drive:  Yes    Target Date: 03/09/24 Frequency: Weekly  Progress: 0 Modality: individual    Therapist will  provide referrals for additional resources as appropriate.  Therapist will provide psycho-education regarding Stephen King's diagnosis and corresponding treatment approaches  and interventions. Licensed Clinical Social Worker, [place], Stephen King will support the patient's ability to achieve the goals identified. will employ CBT, BA, Problem-solving, Solution Focused, Mindfulness,  coping skills, & other evidenced-based practices will be used to promote progress towards healthy functioning to help manage decrease symptoms associated with her diagnosis.   Reduce overall level, frequency, and intensity of the feelings of depression, anxiety and panic evidenced by decreased overall symptoms from 6 to 7 days/week to 0 to 1 days/week per client report for at least 3 consecutive months. Verbally express understanding of the relationship between feelings of depression, anxiety and their impact on thinking patterns and behaviors. Verbalize an understanding of the role that distorted thinking plays in creating fears, excessive worry, and ruminations.  (Stephen King participated in the creation of the treatment plan)    Stephen Ovens, Stephen King

## 2023-04-20 ENCOUNTER — Ambulatory Visit: Payer: Medicare Other | Admitting: Psychology

## 2023-07-21 ENCOUNTER — Other Ambulatory Visit: Payer: Self-pay | Admitting: Cardiology

## 2023-07-21 DIAGNOSIS — I4891 Unspecified atrial fibrillation: Secondary | ICD-10-CM

## 2023-07-21 NOTE — Telephone Encounter (Signed)
 Prescription refill request for Xarelto  received.  Indication:afib Last office visit:9/24 Weight:99.5  kg Age:73 Scr:0.99  10/24 CrCl:94.92  ml/min  Prescription refilled

## 2023-09-12 DIAGNOSIS — I480 Paroxysmal atrial fibrillation: Secondary | ICD-10-CM | POA: Diagnosis not present

## 2023-09-12 DIAGNOSIS — R5383 Other fatigue: Secondary | ICD-10-CM | POA: Diagnosis not present

## 2023-09-14 DIAGNOSIS — R079 Chest pain, unspecified: Secondary | ICD-10-CM | POA: Diagnosis not present

## 2023-09-14 DIAGNOSIS — I6523 Occlusion and stenosis of bilateral carotid arteries: Secondary | ICD-10-CM | POA: Diagnosis not present

## 2023-09-14 DIAGNOSIS — R42 Dizziness and giddiness: Secondary | ICD-10-CM | POA: Diagnosis not present

## 2023-10-08 ENCOUNTER — Other Ambulatory Visit: Payer: Self-pay | Admitting: Cardiology

## 2023-11-06 ENCOUNTER — Other Ambulatory Visit: Payer: Self-pay | Admitting: Cardiology

## 2023-11-16 ENCOUNTER — Encounter: Payer: Self-pay | Admitting: Cardiology

## 2023-11-16 DIAGNOSIS — I4891 Unspecified atrial fibrillation: Secondary | ICD-10-CM

## 2023-11-17 MED ORDER — RIVAROXABAN 20 MG PO TABS: 20.0000 mg | ORAL_TABLET | Freq: Every day | ORAL | 0 refills | Status: DC

## 2023-11-17 MED ORDER — RIVAROXABAN 20 MG PO TABS
20.0000 mg | ORAL_TABLET | Freq: Every day | ORAL | 0 refills | Status: AC
Start: 2023-11-17 — End: ?

## 2023-11-17 MED ORDER — ATORVASTATIN CALCIUM 80 MG PO TABS: 80.0000 mg | ORAL_TABLET | Freq: Every day | ORAL | 0 refills | Status: DC

## 2023-11-17 NOTE — Telephone Encounter (Signed)
 RN  called spoke to patient . He is aware that mediction was e-sent.

## 2023-11-17 NOTE — Addendum Note (Signed)
 Addended by: GLADIS REENA GAILS on: 11/17/2023 12:49 PM   Modules accepted: Orders

## 2023-11-24 ENCOUNTER — Encounter: Payer: Self-pay | Admitting: Sports Medicine

## 2023-12-22 NOTE — Progress Notes (Signed)
 " Cardiology Office Note    Patient Name: Stephen King Date of Encounter: 12/23/2023  Primary Care Provider:  Antoniette Vermell LITTIE DEVONNA Primary Cardiologist:  Oneil Parchment, MD Primary Electrophysiologist: Will Gladis Norton, MD   Past Medical History    Past Medical History:  Diagnosis Date   Allergy    Arthritis    ?right knee (08/13/2017)   Cataract    Dysrhythmia    PAF   GERD (gastroesophageal reflux disease)    Hernia, umbilical    History of hiatal hernia    History of kidney stones    Stroke [facility]) 12/2009   has to watch where he's walking since; more of a spacial thing resulting from stroke (08/13/2017)    History of Present Illness  Stephen King is a 73 y.o. male with a PMH of CAD s/p PCI/DES to circumflex, small jailed OM, PAF s/p AF ablation 08/2017, HTN, HLD, OSA (on CPAP) CVA 2011 who presents today for 1 year follow-up.   Stephen King was last seen 11/26/22 for annual follow-up.  During his visit he was doing well with no new cardiac complaints and BP was well-controlled.  He was staying active and following a standard exercise routine while continuing to work.  He was seen in the ED on 09/14/2023 at the [facility] for complaint of chest pain.  He reported developing chest pain upon awakening that morning and went to the ED for evaluation.  In the ED his EKG was normal with no evidence of ACS and patient was given nitroglycerin  with resolution of discomfort.  He had a normal chest x-ray as well as CT of the chest that showed no evidence of PE or pneumonia.  He did have signs of peripheral vertigo and was offered admission but declined at that time.  He was treated with meclizine which appeared to help his dizziness with instructions to follow-up with his local cardiologist.  Stephen King presents today with his wife for annual follow-up. In June, he experienced an episode of chest pain and dizziness while at the [facility], described as an 'out of body experience'  with chest pain radiating down his left arm and dizziness. He was treated with nitroglycerin , which relieved the chest pain instantly, and meclizine for dizziness. He has not had a recurrence of these symptoms since that episode. He works in office manager at [employer], which involves long hours of physical activity, including walking and standing. Despite this physical exertion, he has not experienced any recurrence of chest pain or dizziness, although he feels tired after long shifts. He has been taking atorvastatin  80 mg for hyperlipidemia but is concerned about potential side effects after his brother experienced muscle loss while on a similar medication. He has joint pain in his knees, ankles, hips, and elbows. He previously stopped metoprolol . He has a history of sleep apnea and previously used a CPAP machine, which he found uncomfortable and discontinued after a few nights. He reports daytime fatigue and difficulty staying awake during the day, which may be related to untreated sleep apnea. He has not used the CPAP machine for approximately three years. Family history is significant for heart disease, as mentioned by his wife during the visit. No current chest pain, dizziness, or symptoms of atrial fibrillation. Reports joint pain and daytime fatigue.Patient denies chest pain, palpitations, dyspnea, PND, orthopnea, nausea, vomiting, dizziness, syncope, edema, weight gain, or early satiety.  Discussed the use of AI scribe software for clinical note transcription with the patient,  who gave verbal consent to proceed.  History of Present Illness   Review of Systems  Please see the history of present illness.    All other systems reviewed and are otherwise negative except as noted above.  Physical Exam    Wt Readings from Last 3 Encounters:  12/23/23 218 lb 6.4 oz (99.1 kg)  12/29/22 219 lb 4 oz (99.5 kg)  12/24/22 219 lb (99.3 kg)   VS: Vitals:   12/23/23 1026  BP: 114/78  Pulse: (!) 58  SpO2: 98%   ,Body mass index is 31.34 kg/m. GEN: Well nourished, well developed in no acute distress Neck: No JVD; No carotid bruits Pulmonary: Clear to auscultation without rales, wheezing or rhonchi  Cardiovascular: Normal rate. Regular rhythm. Normal S1. Normal S2.   Murmurs: There is no murmur.  ABDOMEN: Soft, non-tender, non-distended EXTREMITIES:  No edema; No deformity   EKG/LABS/ Recent Cardiac Studies   ECG personally reviewed by me today -sinus bradycardia with rate of 59 bpm and no acute changes consistent with previous EKG.  Risk Assessment/Calculations:    CHA2DS2-VASc Score = 3   This indicates a 3.2% annual risk of stroke. The patient's score is based upon: CHF History: 0 HTN History: 1 Diabetes History: 0 Stroke History: 0 Vascular Disease History: 1 Age Score: 1 Gender Score: 0     STOP-Bang Score:  6      Lab Results  Component Value Date   WBC 7.1 12/24/2022   HGB 15.6 12/24/2022   HCT 45.7 12/24/2022   MCV 95 12/24/2022   PLT 226 12/24/2022   Lab Results  Component Value Date   CREATININE 0.99 12/24/2022   BUN 9 12/24/2022   NA 143 12/24/2022   K 4.4 12/24/2022   CL 105 12/24/2022   CO2 24 12/24/2022   Lab Results  Component Value Date   CHOL 104 12/24/2022   HDL 44 12/24/2022   LDLCALC 46 12/24/2022   TRIG 64 12/24/2022   CHOLHDL 2.4 12/24/2022    Lab Results  Component Value Date   HGBA1C 5.1 08/21/2013   Assessment & Plan    Assessment & Plan  1.Coronary artery disease: -s/p PCI/DES to circumflex, small jailed OM with repeat LHC 05/2019 showing patent stents. -Presented to the hospital 09/13/2023 midsternal chest pain radiating to the left arm and dizziness that improved with nitroglycerin  and meclizine. -Today patient reports no recurrence of chest pain or angina since ED visit in June. - Continue current GDMT with with Lipitor  80 mg daily and.  Nitrostat  0.4 mg  2.Paroxysmal AF: -s/p AF ablation in 2019 recurrence and currently not on  rate control medication with no recurrences of arrhythmia since previous visit - Today patient is in sinus bradycardia with no recurrence of atrial fibrillation since follow-up -Patient's creatinine clearance is 95 mL/min - Continue Xarelto  20 mg daily  3.Essential hypertension: -Patient's blood pressure is stable at 114/78 - Continue lifestyle modifications and reduce salt diet.  4.Hyperlipidemia: -Patient's last LDL cholesterol was well-controlled at 46 -Patient reports joint pain with possible link of statin intolerance and myalgia -  Hold atorvastatin  for 30 days. - Resume atorvastatin  if no improvement after 30 days. - Contact office if symptoms improve after holding atorvastatin .  5. Obstructive sleep apnea: CPAP intolerable. Daytime fatigue suggests untreated sleep apnea. High STOP-BANG score. Discussed alternative treatments. - Order split night sleep study. - Coordinate with sleep coordinator for scheduling and insurance approval.  Disposition: Follow-up with Oneil Parchment, MD or APP in 12  months   Signed, Wyn Raddle, Jackee Shove, NP 12/23/2023, 11:52 AM [facility] "

## 2023-12-23 ENCOUNTER — Ambulatory Visit: Attending: Internal Medicine | Admitting: Nurse Practitioner

## 2023-12-23 ENCOUNTER — Encounter: Payer: Self-pay | Admitting: Nurse Practitioner

## 2023-12-23 VITALS — BP 114/78 | HR 58 | Ht 70.0 in | Wt 218.4 lb

## 2023-12-23 DIAGNOSIS — E785 Hyperlipidemia, unspecified: Secondary | ICD-10-CM | POA: Diagnosis not present

## 2023-12-23 DIAGNOSIS — I10 Essential (primary) hypertension: Secondary | ICD-10-CM

## 2023-12-23 DIAGNOSIS — I480 Paroxysmal atrial fibrillation: Secondary | ICD-10-CM

## 2023-12-23 DIAGNOSIS — I2510 Atherosclerotic heart disease of native coronary artery without angina pectoris: Secondary | ICD-10-CM

## 2023-12-23 DIAGNOSIS — Z9861 Coronary angioplasty status: Secondary | ICD-10-CM

## 2023-12-23 DIAGNOSIS — G4733 Obstructive sleep apnea (adult) (pediatric): Secondary | ICD-10-CM | POA: Diagnosis not present

## 2023-12-23 NOTE — Patient Instructions (Addendum)
 Medication Instructions:  HOLD LIPITOR  (atorvastatin ) for 30 days to see if the muscles get better *If you need a refill on your cardiac medications before your next appointment, please call your pharmacy*  Lab Work: None ordered If you have labs (blood work) drawn today and your tests are completely normal, you will receive your results only by: MyChart Message (if you have MyChart) OR A paper copy in the mail If you have any lab test that is abnormal or we need to change your treatment, we will call you to review the results.  Testing/Procedures: Your physician has recommended that you have a sleep study. This test records several body functions during sleep, including: brain activity, eye movement, oxygen and carbon dioxide blood levels, heart rate and rhythm, breathing rate and rhythm, the flow of air through your mouth and nose, snoring, body muscle movements, and chest and belly movement.   Follow-Up: At [facility], you and your health needs are our priority.  As part of our continuing mission to provide you with exceptional heart care, our providers are all part of one team.  This team includes your primary Cardiologist (physician) and Advanced Practice Providers or APPs (Physician Assistants and Nurse Practitioners) who all work together to provide you with the care you need, when you need it.  Your next appointment:   12 month(s)  Provider:   Oneil Parchment, MD    We recommend signing up for the patient portal called MyChart.  Sign up information is provided on this After Visit Summary.  MyChart is used to connect with patients for Virtual Visits (Telemedicine).  Patients are able to view lab/test results, encounter notes, upcoming appointments, etc.  Non-urgent messages can be sent to your provider as well.   To learn more about what you can do with MyChart, go to [email or web address].   Other Instructions  TELL YOUR WIFE TO HAVE HER PRIMARY CARE PROVIDER TO ORDER  A CALCIUM  SCORE

## 2024-01-25 ENCOUNTER — Telehealth: Payer: Self-pay

## 2024-01-25 NOTE — Telephone Encounter (Addendum)
**Note De-Identified Stephen King Obfuscation** Per the [facility] Provider Portal, A PA is not required for CPT Code: 04199 (Split Night Sleep Study). Decision ID #: [id number]    I have transferred the order to the sleep lab.  I called the pt and made him aware that a PA is not required for a Split Night Sleep Study and I gave him the sleep labs phone number so he can call them to be scheduled. I did advise him that per the [facility] Provider portal, his insurance coverage will term on 03/23/2024. The pt stated that his insurance will remain the same moving into 2026 as he is not making any changes.  He thanked me for my call.

## 2024-01-26 ENCOUNTER — Other Ambulatory Visit: Payer: Self-pay | Admitting: Cardiology

## 2024-01-27 ENCOUNTER — Encounter (HOSPITAL_COMMUNITY): Payer: Self-pay

## 2024-01-28 ENCOUNTER — Other Ambulatory Visit: Payer: Self-pay | Admitting: Medical Genetics

## 2024-01-29 ENCOUNTER — Telehealth: Payer: Self-pay

## 2024-01-29 DIAGNOSIS — E785 Hyperlipidemia, unspecified: Secondary | ICD-10-CM

## 2024-01-29 NOTE — Telephone Encounter (Signed)
 See mychart message, per Dr Jeffrie lipid clinic referral has been placed for the patient.

## 2024-02-02 ENCOUNTER — Ambulatory Visit (INDEPENDENT_AMBULATORY_CARE_PROVIDER_SITE_OTHER)

## 2024-02-02 VITALS — BP 121/71 | HR 63 | Ht 70.0 in | Wt 220.0 lb

## 2024-02-02 DIAGNOSIS — Z0000 Encounter for general adult medical examination without abnormal findings: Secondary | ICD-10-CM

## 2024-02-02 DIAGNOSIS — Z23 Encounter for immunization: Secondary | ICD-10-CM | POA: Diagnosis not present

## 2024-02-02 NOTE — Patient Instructions (Signed)
 Stephen King,  Thank you for taking the time for your Medicare Wellness Visit. I appreciate your continued commitment to your health goals. Please review the care plan we discussed, and feel free to reach out if I can assist you further.  Please note that Annual Wellness Visits do not include a physical exam. Some assessments may be limited, especially if the visit was conducted virtually. If needed, we may recommend an in-person follow-up with your provider.  Ongoing Care Seeing your primary care provider every 3 to 6 months helps us  monitor your health and provide consistent, personalized care.   Referrals If a referral was made during today's visit and you haven't received any updates within two weeks, please contact the referred provider directly to check on the status.  Recommended Screenings:  Health Maintenance  Topic Date Due   COVID-19 Vaccine (4 - 2025-26 season) 11/23/2023   Medicare Annual Wellness Visit  12/16/2023   Colon Cancer Screening  07/11/2026   DTaP/Tdap/Td vaccine (3 - Td or Tdap) 06/29/2029   Pneumococcal Vaccine for age over 41  Completed   Flu Shot  Completed   Hepatitis C Screening  Completed   Zoster (Shingles) Vaccine  Completed   Meningitis B Vaccine  Aged Out       02/02/2024    4:02 PM  Advanced Directives  Does Patient Have a Medical Advance Directive? Yes  Type of Estate Agent of [place];Living will  Does patient want to make changes to medical advance directive? No - Guardian declined  Copy of Healthcare Power of Attorney in Chart? No - copy requested    Vision: Annual vision screenings are recommended for early detection of glaucoma, cataracts, and diabetic retinopathy. These exams can also reveal signs of chronic conditions such as diabetes and high blood pressure.  Dental: Annual dental screenings help detect early signs of oral cancer, gum disease, and other conditions linked to overall health, including heart disease  and diabetes.  Please see the attached documents for additional preventive care recommendations.

## 2024-02-02 NOTE — Progress Notes (Signed)
 Chief Complaint  Patient presents with   Medicare Wellness     Subjective:   Stephen King is a 73 y.o. male who presents for a Medicare Annual Wellness Visit.  Allergies (verified) Feldene [piroxicam]   History: Past Medical History:  Diagnosis Date   Allergy    Arthritis    ?right knee (08/13/2017)   Cataract    Dysrhythmia    PAF   GERD (gastroesophageal reflux disease)    Hernia, umbilical    History of hiatal hernia    History of kidney stones    Stroke [facility]) 12/2009   has to watch where he's walking since; more of a spacial thing resulting from stroke (08/13/2017)   Past Surgical History:  Procedure Laterality Date   ABDOMINAL HERNIA REPAIR     has a mesh in his abdomen   ANTERIOR CERVICAL DECOMP/DISCECTOMY FUSION  10/2005   C6-7   ATRIAL FIBRILLATION ABLATION N/A 09/17/2017   Procedure: ATRIAL FIBRILLATION ABLATION;  Surgeon: Inocencio Soyla Lunger, MD;  Location: [facility];  Service: Cardiovascular;  Laterality: N/A;   BACK SURGERY     CARDIAC CATHETERIZATION  2003   CATARACT EXTRACTION W/ INTRAOCULAR LENS  IMPLANT, BILATERAL Bilateral    CORONARY ANGIOPLASTY WITH STENT PLACEMENT  08/13/2017   CORONARY STENT INTERVENTION N/A 08/13/2017   Procedure: CORONARY STENT INTERVENTION;  Surgeon: Verlin Lonni BIRCH, MD;  Location: [facility];  Service: Cardiovascular;  Laterality: N/A;   EXCISIONAL HEMORRHOIDECTOMY     EYE SURGERY     HERNIA REPAIR     LEFT HEART CATH AND CORONARY ANGIOGRAPHY N/A 08/13/2017   Procedure: LEFT HEART CATH AND CORONARY ANGIOGRAPHY;  Surgeon: Verlin Lonni BIRCH, MD;  Location: [facility];  Service: Cardiovascular;  Laterality: N/A;   LEFT HEART CATH AND CORONARY ANGIOGRAPHY N/A 06/20/2019   Procedure: LEFT HEART CATH AND CORONARY ANGIOGRAPHY;  Surgeon: Mady Lonni, MD;  Location: [facility];  Service: Cardiovascular;  Laterality: N/A;   SHOULDER ARTHROSCOPY WITH ROTATOR CUFF REPAIR Right  10/30/2014   Procedure: SHOULDER ARTHROSCOPY WITH ROTATOR CUFF REPAIR;  Surgeon: Eva Herring, MD;  Location: [facility];  Service: Orthopedics;  Laterality: Right;   SHOULDER ARTHROSCOPY WITH SUBACROMIAL DECOMPRESSION Right 10/30/2014   Procedure: Right shoulder arthroscopy rotator cuff repair, subacromial decompression;  Surgeon: Eva Herring, MD;  Location: [facility];  Service: Orthopedics;  Laterality: Right;  Right shoulder arthrscopy rotator cuff repair, subacromial decompression   SPINE SURGERY     Family History  Problem Relation Age of Onset   Alcohol abuse Mother    Arrhythmia Mother    Colon cancer Father    Alcohol abuse Father    Stroke Maternal Grandfather    Social History   Occupational History    Comment: Retired-working part-time  Tobacco Use   Smoking status: Former    Current packs/day: 0.00    Types: Cigarettes, Cigars    Start date: 03/24/1972    Quit date: 03/25/1999    Years since quitting: 24.8   Smokeless tobacco: Never  Vaping Use   Vaping status: Never Used  Substance and Sexual Activity   Alcohol use: No   Drug use: No   Sexual activity: Not Currently   Tobacco Counseling Counseling given: Not Answered  SDOH Screenings   Food Insecurity: No Food Insecurity (02/02/2024)  Housing: Low Risk  (02/02/2024)  Transportation Needs: No Transportation Needs (02/02/2024)  Utilities: Not At Risk (02/02/2024)  Alcohol Screen: Low  Risk  (12/16/2022)  Depression (PHQ2-9): Low Risk  (02/02/2024)  Financial Resource Strain: Low Risk  (01/29/2024)  Physical Activity: Insufficiently Active (02/02/2024)  Social Connections: Moderately Integrated (02/02/2024)  Recent Concern: Social Connections - Moderately Isolated (01/29/2024)  Stress: Stress Concern Present (02/02/2024)  Tobacco Use: Medium Risk (02/02/2024)  Health Literacy: Adequate Health Literacy (02/02/2024)   Depression Screen    02/02/2024    4:07 PM 12/16/2022    10:04 AM 07/01/2022    1:20 PM 12/11/2021    9:50 AM 10/02/2020    9:10 AM 12/28/2019    3:04 PM 01/19/2019   10:59 AM  PHQ 2/9 Scores  PHQ - 2 Score 0 1 0 0 0 2 0  PHQ- 9 Score      5  2      Data saved with a previous flowsheet row definition      Goals Addressed             This Visit's Progress    Patient Stated       Patient states he would like to continue a healthy lifestyle.        Visit info / Clinical Intake: Medicare Wellness Visit Type:: Subsequent Annual Wellness Visit Persons participating in visit:: patient Medicare Wellness Visit Mode:: In-person (required for WTM) Information given by:: patient Interpreter Needed?: No Pre-visit prep was completed: yes AWV questionnaire completed by patient prior to visit?: yes Date:: 01/29/24 Living arrangements:: lives with spouse/significant other Patient's Overall Health Status Rating: good Typical amount of pain: none Does pain affect daily life?: no Are you currently prescribed opioids?: no  Dietary Habits and Nutritional Risks How many meals a day?: 3 Eats fruit and vegetables daily?: yes Most meals are obtained by: preparing own meals In the last 2 weeks, have you had any of the following?: (!) nausea, vomiting, diarrhea (Some diarrhea) Diabetic:: no  Functional Status Activities of Daily Living (to include ambulation/medication): Independent Ambulation: Independent Medication Administration: Independent Home Management: Independent Manage your own finances?: yes Primary transportation is: driving Concerns about vision?: (!) yes (He is going to get his vision checked.) Concerns about hearing?: no  Fall Screening Falls in the past year?: 0 Number of falls in past year: 0 Was there an injury with Fall?: 0 Fall Risk Category Calculator: 0 Patient Fall Risk Level: Low Fall Risk  Fall Risk Patient at Risk for Falls Due to: No Fall Risks Fall risk Follow up: Falls evaluation completed  Home and  Transportation Safety: All rugs have non-skid backing?: yes All stairs or steps have railings?: yes Grab bars in the bathtub or shower?: (!) no Have non-skid surface in bathtub or shower?: (!) no Good home lighting?: yes Regular seat belt use?: yes Hospital stays in the last year:: no  Cognitive Assessment Difficulty concentrating, remembering, or making decisions? : yes Will 6CIT or Mini Cog be Completed: yes What year is it?: 0 points What month is it?: 0 points Give patient an address phrase to remember (5 components): [address] [place], [place] [address] About what time is it?: 0 points Count backwards from 20 to 1: 0 points Say the months of the year in reverse: 0 points Repeat the address phrase from earlier: 0 points 6 CIT Score: 0 points  Advance Directives (For Healthcare) Does Patient Have a Medical Advance Directive?: Yes Does patient want to make changes to medical advance directive?: No - Guardian declined Type of Advance Directive: Healthcare Power of [place]; Living will Copy of Healthcare Power of  Attorney in Chart?: No - copy requested Copy of Living Will in Chart?: No - copy requested  Reviewed/Updated  Reviewed/Updated: Medical History; Surgical History; Family History; Medications; Allergies; Care Teams; Patient Goals        Objective:    Today's Vitals   02/02/24 1551  BP: 121/71  Pulse: 63  SpO2: 97%  Weight: 220 lb (99.8 kg)  Height: 5' 10 (1.778 m)   Body mass index is 31.57 kg/m.  Current Medications (verified) Outpatient Encounter Medications as of 02/02/2024  Medication Sig   clobetasol  cream (TEMOVATE ) 0.05 % Apply 1 Application topically 2 (two) times daily.   metoprolol  tartrate (LOPRESSOR ) 25 MG tablet TAKE ONE-HALF TABLET BY  MOUTH TWICE DAILY (Patient taking differently: Take 12.5 mg by mouth daily as needed. prn)   nitroGLYCERIN  (NITROSTAT ) 0.4 MG SL tablet Place 1 tablet (0.4 mg total) under the tongue every 5 (five) minutes x 3  doses as needed for chest pain.   rivaroxaban  (XARELTO ) 20 MG TABS tablet Take 1 tablet (20 mg total) by mouth daily with supper.   atorvastatin  (LIPITOR ) 80 MG tablet TAKE 1 TABLET BY MOUTH ONCE  DAILY (Patient not taking: Reported on 02/02/2024)   No facility-administered encounter medications on file as of 02/02/2024.   Hearing/Vision screen No results found. Immunizations and Health Maintenance Health Maintenance  Topic Date Due   COVID-19 Vaccine (4 - 2025-26 season) 11/23/2023   Medicare Annual Wellness (AWV)  02/01/2025   Colonoscopy  07/11/2026   DTaP/Tdap/Td (3 - Td or Tdap) 06/29/2029   Pneumococcal Vaccine: 50+ Years  Completed   Influenza Vaccine  Completed   Hepatitis C Screening  Completed   Zoster Vaccines- Shingrix  Completed   Meningococcal B Vaccine  Aged Out        Assessment/Plan:  This is a routine wellness examination for Stephen King.  Patient Care Team: Breeback, Jade L, PA-C as PCP - General (Family Medicine) Jeffrie Oneil BROCKS, MD as PCP - Cardiology (Cardiology)  I have personally reviewed and noted the following in the patient's chart:   Medical and social history Use of alcohol, tobacco or illicit drugs  Current medications and supplements including opioid prescriptions. Functional ability and status Nutritional status Physical activity Advanced directives List of other physicians Hospitalizations, surgeries, and ER visits in previous 12 months Vitals Screenings to include cognitive, depression, and falls Referrals and appointments  Orders Placed This Encounter  Procedures   Flu vaccine HIGH DOSE PF(Fluzone Trivalent)   In addition, I have reviewed and discussed with patient certain preventive protocols, quality metrics, and best practice recommendations. A written personalized care plan for preventive services as well as general preventive health recommendations were provided to patient.   Bonny Jon Mayor, CMA   02/02/2024   Return in 1 year  (on 02/01/2025).  After Visit Summary: (In Person-Printed) AVS printed and given to the patient  Nurse Notes:   Stephen King is a 73 y.o. male patient of Antoniette, Jade L, PA-C who had a [employer] Visit today. [place] lives with their family. He has 2 children. He reports that he is socially active and does interact with friends/family regularly. He is moderately physically active and enjoys working with model trains and wood working.

## 2024-02-04 ENCOUNTER — Encounter: Payer: Self-pay | Admitting: Physician Assistant

## 2024-02-09 ENCOUNTER — Other Ambulatory Visit (HOSPITAL_COMMUNITY)
Admission: RE | Admit: 2024-02-09 | Discharge: 2024-02-09 | Disposition: A | Discharge status: Home / Self Care | Payer: Self-pay | Source: Ambulatory Visit | Attending: Medical Genetics | Admitting: Medical Genetics

## 2024-02-10 ENCOUNTER — Ambulatory Visit: Admitting: Physician Assistant

## 2024-02-22 LAB — GENECONNECT MOLECULAR SCREEN: Genetic Analysis Overall Interpretation: NEGATIVE

## 2024-03-10 ENCOUNTER — Other Ambulatory Visit: Payer: Self-pay | Admitting: *Deleted

## 2024-03-10 DIAGNOSIS — I4891 Unspecified atrial fibrillation: Secondary | ICD-10-CM

## 2024-03-10 MED ORDER — RIVAROXABAN 20 MG PO TABS: 20.0000 mg | ORAL_TABLET | Freq: Every day | ORAL | 0 refills | Status: AC

## 2024-03-10 NOTE — Telephone Encounter (Signed)
 Prescription refill request for Xarelto  received.  Indication: afib  Last office visit: Wyn 12/23/2023 Weight: 99.8 kg  Age: 73 yo  Scr: 0.99, 09/14/2023 CrCl: 95 ml/min   Refill sent.

## 2024-03-22 ENCOUNTER — Ambulatory Visit

## 2024-03-31 ENCOUNTER — Ambulatory Visit (HOSPITAL_BASED_OUTPATIENT_CLINIC_OR_DEPARTMENT_OTHER): Admitting: Cardiology

## 2025-02-07 ENCOUNTER — Ambulatory Visit
